# Patient Record
Sex: Male | Born: 1980 | Race: Black or African American | Hispanic: No | Marital: Single
Health system: Southern US, Academic
[De-identification: ages and names within clinical notes are randomized; demographics above are authoritative.]

## PROBLEM LIST (undated history)

## (undated) ENCOUNTER — Encounter

## (undated) ENCOUNTER — Ambulatory Visit

## (undated) ENCOUNTER — Ambulatory Visit: Payer: PRIVATE HEALTH INSURANCE

## (undated) ENCOUNTER — Encounter: Attending: Infectious Disease | Primary: Infectious Disease

## (undated) ENCOUNTER — Telehealth: Attending: Internal Medicine | Primary: Internal Medicine

## (undated) ENCOUNTER — Telehealth

## (undated) ENCOUNTER — Encounter
Attending: Student in an Organized Health Care Education/Training Program | Primary: Student in an Organized Health Care Education/Training Program

## (undated) ENCOUNTER — Telehealth
Attending: Student in an Organized Health Care Education/Training Program | Primary: Student in an Organized Health Care Education/Training Program

## (undated) ENCOUNTER — Encounter: Payer: PRIVATE HEALTH INSURANCE | Attending: Infectious Disease | Primary: Infectious Disease

## (undated) ENCOUNTER — Ambulatory Visit: Attending: Infectious Disease | Primary: Infectious Disease

## (undated) ENCOUNTER — Ambulatory Visit
Payer: PRIVATE HEALTH INSURANCE | Attending: Student in an Organized Health Care Education/Training Program | Primary: Student in an Organized Health Care Education/Training Program

## (undated) ENCOUNTER — Ambulatory Visit
Payer: BLUE CROSS/BLUE SHIELD | Attending: Student in an Organized Health Care Education/Training Program | Primary: Student in an Organized Health Care Education/Training Program

## (undated) ENCOUNTER — Ambulatory Visit: Payer: PRIVATE HEALTH INSURANCE | Attending: Infectious Disease | Primary: Infectious Disease

## (undated) ENCOUNTER — Telehealth: Attending: Infectious Disease | Primary: Infectious Disease

## (undated) ENCOUNTER — Encounter: Attending: Registered" | Primary: Registered"

## (undated) DIAGNOSIS — I10 Essential (primary) hypertension: Secondary | ICD-10-CM

## (undated) DIAGNOSIS — E7800 Pure hypercholesterolemia, unspecified: Secondary | ICD-10-CM

## (undated) DIAGNOSIS — I509 Heart failure, unspecified: Secondary | ICD-10-CM

## (undated) DIAGNOSIS — J45909 Unspecified asthma, uncomplicated: Secondary | ICD-10-CM

## (undated) DIAGNOSIS — Z21 Asymptomatic human immunodeficiency virus [HIV] infection status: Secondary | ICD-10-CM

## (undated) DIAGNOSIS — R51 Headache: Secondary | ICD-10-CM

## (undated) DIAGNOSIS — R519 Headache, unspecified: Secondary | ICD-10-CM

## (undated) DIAGNOSIS — B20 Human immunodeficiency virus [HIV] disease: Secondary | ICD-10-CM

## (undated) DIAGNOSIS — K429 Umbilical hernia without obstruction or gangrene: Secondary | ICD-10-CM

## (undated) DIAGNOSIS — G459 Transient cerebral ischemic attack, unspecified: Secondary | ICD-10-CM

---
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
## 2006-03-29 ENCOUNTER — Emergency Department (HOSPITAL_COMMUNITY): Admission: EM | Admit: 2006-03-29 | Discharge: 2006-03-29 | Payer: Self-pay | Admitting: Emergency Medicine

## 2010-11-15 ENCOUNTER — Emergency Department (HOSPITAL_COMMUNITY)
Admission: EM | Admit: 2010-11-15 | Discharge: 2010-11-16 | Disposition: A | Discharge status: Home / Self Care | Payer: BC Managed Care – PPO | Attending: Emergency Medicine | Admitting: Emergency Medicine

## 2010-11-15 DIAGNOSIS — R1013 Epigastric pain: Secondary | ICD-10-CM | POA: Insufficient documentation

## 2010-11-15 DIAGNOSIS — J45909 Unspecified asthma, uncomplicated: Secondary | ICD-10-CM | POA: Insufficient documentation

## 2010-11-15 DIAGNOSIS — Z79899 Other long term (current) drug therapy: Secondary | ICD-10-CM | POA: Insufficient documentation

## 2010-11-15 DIAGNOSIS — R112 Nausea with vomiting, unspecified: Secondary | ICD-10-CM | POA: Insufficient documentation

## 2010-11-15 DIAGNOSIS — I10 Essential (primary) hypertension: Secondary | ICD-10-CM | POA: Insufficient documentation

## 2010-11-15 LAB — URINALYSIS, ROUTINE W REFLEX MICROSCOPIC
Leukocytes, UA: NEGATIVE
Protein, ur: 30 mg/dL — AB
Specific Gravity, Urine: 1.026 (ref 1.005–1.030)
Urine Glucose, Fasting: NEGATIVE mg/dL

## 2010-11-15 LAB — DIFFERENTIAL
Basophils Absolute: 0 10*3/uL (ref 0.0–0.1)
Basophils Relative: 0 % (ref 0–1)
Eosinophils Absolute: 0 10*3/uL (ref 0.0–0.7)
Monocytes Relative: 8 % (ref 3–12)
Neutro Abs: 11.5 10*3/uL — ABNORMAL HIGH (ref 1.7–7.7)
Neutrophils Relative %: 81 % — ABNORMAL HIGH (ref 43–77)

## 2010-11-15 LAB — CBC
Hemoglobin: 17.4 g/dL — ABNORMAL HIGH (ref 13.0–17.0)
Platelets: 177 10*3/uL (ref 150–400)
RBC: 4.9 MIL/uL (ref 4.22–5.81)
WBC: 14.3 10*3/uL — ABNORMAL HIGH (ref 4.0–10.5)

## 2010-11-15 LAB — URINE MICROSCOPIC-ADD ON

## 2010-11-15 LAB — BASIC METABOLIC PANEL
CO2: 28 mEq/L (ref 19–32)
Chloride: 106 mEq/L (ref 96–112)
GFR calc Af Amer: >60 mL/min (ref >60)
Potassium: 3.7 mEq/L (ref 3.5–5.1)
Sodium: 143 mEq/L (ref 135–145)

## 2010-12-13 ENCOUNTER — Emergency Department (HOSPITAL_COMMUNITY)
Admission: EM | Admit: 2010-12-13 | Discharge: 2010-12-14 | Disposition: A | Discharge status: Home / Self Care | Payer: BC Managed Care – PPO | Attending: Emergency Medicine | Admitting: Emergency Medicine

## 2010-12-13 DIAGNOSIS — I10 Essential (primary) hypertension: Secondary | ICD-10-CM | POA: Insufficient documentation

## 2010-12-13 DIAGNOSIS — Z79899 Other long term (current) drug therapy: Secondary | ICD-10-CM | POA: Insufficient documentation

## 2010-12-13 DIAGNOSIS — J45909 Unspecified asthma, uncomplicated: Secondary | ICD-10-CM | POA: Insufficient documentation

## 2010-12-13 DIAGNOSIS — R220 Localized swelling, mass and lump, head: Secondary | ICD-10-CM | POA: Insufficient documentation

## 2010-12-13 DIAGNOSIS — K029 Dental caries, unspecified: Secondary | ICD-10-CM | POA: Insufficient documentation

## 2010-12-13 DIAGNOSIS — K089 Disorder of teeth and supporting structures, unspecified: Secondary | ICD-10-CM | POA: Insufficient documentation

## 2011-08-22 ENCOUNTER — Emergency Department (HOSPITAL_COMMUNITY): Payer: BC Managed Care – PPO

## 2011-08-22 ENCOUNTER — Encounter: Payer: Self-pay | Admitting: *Deleted

## 2011-08-22 ENCOUNTER — Emergency Department (HOSPITAL_COMMUNITY)
Admission: EM | Admit: 2011-08-22 | Discharge: 2011-08-22 | Disposition: A | Discharge status: Home / Self Care | Payer: BC Managed Care – PPO | Attending: Emergency Medicine | Admitting: Emergency Medicine

## 2011-08-22 DIAGNOSIS — I10 Essential (primary) hypertension: Secondary | ICD-10-CM | POA: Insufficient documentation

## 2011-08-22 DIAGNOSIS — M79672 Pain in left foot: Secondary | ICD-10-CM

## 2011-08-22 DIAGNOSIS — M7989 Other specified soft tissue disorders: Secondary | ICD-10-CM | POA: Insufficient documentation

## 2011-08-22 DIAGNOSIS — E7800 Pure hypercholesterolemia, unspecified: Secondary | ICD-10-CM | POA: Insufficient documentation

## 2011-08-22 DIAGNOSIS — M79609 Pain in unspecified limb: Secondary | ICD-10-CM | POA: Insufficient documentation

## 2011-08-22 HISTORY — DX: Essential (primary) hypertension: I10

## 2011-08-22 HISTORY — DX: Pure hypercholesterolemia, unspecified: E78.00

## 2011-08-22 MED ORDER — HYDROCODONE-ACETAMINOPHEN 5-325 MG PO TABS
1.0000 | ORAL_TABLET | Freq: Once | ORAL | Status: AC
  Administered 2011-08-22: 1 via ORAL
  Filled 2011-08-22: qty 1

## 2011-08-22 MED ORDER — HYDROCODONE-ACETAMINOPHEN 5-325 MG PO TABS: 1.0000 | ORAL_TABLET | ORAL | Status: AC | PRN

## 2011-08-22 MED ORDER — IBUPROFEN 800 MG PO TABS: 800.0000 mg | ORAL_TABLET | Freq: Three times a day (TID) | ORAL | Status: AC

## 2011-08-22 NOTE — ED Notes (Signed)
States that he woke up this am and his foot was swollen and very pain. Good pulses. Denies injury

## 2011-08-22 NOTE — ED Provider Notes (Signed)
History     CSN: [id number] Arrival date & time: 08/22/2011  3:15 PM   First MD Initiated Contact with Patient 08/22/11 1621      Chief Complaint  Patient presents with  . Foot Pain    pt c/o left foot pain with swelling and unable to bend toes on left foot. pt denies recent injury. pt c/o increased pain with weight bearing.     (Consider location/radiation/quality/duration/timing/severity/associated sxs/prior treatment) HPI History provided by pt.   Pt c/o non-traumatic, severe L dorsal foot pain since yesterday.  Associated w/ tingling and edema.  Denies fever.   Has never had these sx in the past.  Immunocompetent.  Past Medical History  Diagnosis Date  . Hypertension   . High cholesterol     History reviewed. No pertinent past surgical history.  History reviewed. No pertinent family history.  History  Substance Use Topics  . Smoking status: Never Smoker   . Smokeless tobacco: Not on file  . Alcohol Use: Yes      Review of Systems  All other systems reviewed and are negative.    Allergies  Review of patient's allergies indicates no known allergies.  Home Medications   Current Outpatient Rx  Name Route Sig Dispense Refill  . ATORVASTATIN CALCIUM 10 MG PO TABS Oral Take 10 mg by mouth daily.      . ENALAPRIL-HYDROCHLOROTHIAZIDE 10-25 MG PO TABS Oral Take 1 tablet by mouth daily.      Marland Kitchen NAPROXEN SODIUM 220 MG PO TABS Oral Take 220 mg by mouth 2 (two) times daily with a meal. pain       BP 125/89  Pulse 90  Temp(Src) 97.8 F (36.6 C) (Oral)  Resp 20  Wt 170 lb (77.111 kg)  SpO2 98%  Physical Exam  Nursing note and vitals reviewed. Constitutional: He is oriented to person, place, and time. He appears well-developed and well-nourished. No distress.  HENT:  Head: Normocephalic and atraumatic.  Eyes:       Normal appearance  Neck: Normal range of motion.  Musculoskeletal:       Left dorsal forefoot edematous.  No erythema or other skin changes.   Tenderness over 2nd and 3rd metatarsals.  Pain w/ passive ROM 2nd and 3rd toes.  2+ radial pulse and distal sensation intact.  Nml ankle exam.    Neurological: He is alert and oriented to person, place, and time.  Psychiatric: He has a normal mood and affect. His behavior is normal.    ED Course  Procedures (including critical care time)  Labs Reviewed - No data to display Dg Foot Complete Left  08/22/2011  *RADIOLOGY REPORT*  Clinical Data: Pain and swelling  LEFT FOOT - COMPLETE 3+ VIEW  Comparison: None.  Findings: No evidence of fracture, dislocation, degenerative change or other focal lesion.  IMPRESSION: Normal radiographs  Original Report Authenticated By: Thomasenia Sales, M.D.     1. Foot pain, left       MDM  Pt presents w/ non-traumatic L foot pain.  Xray neg.  Infection unlikely because pt immunocompetent and no fever or erythema/warmth of foot on exam.  Recommended NSAID, rest, ice, elevation.  Strict return precautions discussed.        Arie Sabina [place], [place] 08/23/11 1615

## 2011-08-27 NOTE — ED Provider Notes (Signed)
Medical screening examination/treatment/procedure(s) were performed by non-physician practitioner and as supervising physician I was immediately available for consultation/collaboration.   Suzi Roots, MD 08/27/11 [phone]

## 2013-05-19 ENCOUNTER — Encounter (HOSPITAL_COMMUNITY): Payer: Self-pay | Admitting: Emergency Medicine

## 2013-05-19 ENCOUNTER — Emergency Department (HOSPITAL_COMMUNITY)
Admission: EM | Admit: 2013-05-19 | Discharge: 2013-05-19 | Disposition: A | Discharge status: Home / Self Care | Payer: BC Managed Care – PPO | Attending: Emergency Medicine | Admitting: Emergency Medicine

## 2013-05-19 DIAGNOSIS — Z7982 Long term (current) use of aspirin: Secondary | ICD-10-CM | POA: Insufficient documentation

## 2013-05-19 DIAGNOSIS — Z8639 Personal history of other endocrine, nutritional and metabolic disease: Secondary | ICD-10-CM | POA: Insufficient documentation

## 2013-05-19 DIAGNOSIS — Z21 Asymptomatic human immunodeficiency virus [HIV] infection status: Secondary | ICD-10-CM | POA: Insufficient documentation

## 2013-05-19 DIAGNOSIS — K089 Disorder of teeth and supporting structures, unspecified: Secondary | ICD-10-CM | POA: Insufficient documentation

## 2013-05-19 DIAGNOSIS — K047 Periapical abscess without sinus: Secondary | ICD-10-CM | POA: Insufficient documentation

## 2013-05-19 DIAGNOSIS — Z862 Personal history of diseases of the blood and blood-forming organs and certain disorders involving the immune mechanism: Secondary | ICD-10-CM | POA: Insufficient documentation

## 2013-05-19 DIAGNOSIS — I10 Essential (primary) hypertension: Secondary | ICD-10-CM | POA: Insufficient documentation

## 2013-05-19 DIAGNOSIS — K0889 Other specified disorders of teeth and supporting structures: Secondary | ICD-10-CM

## 2013-05-19 DIAGNOSIS — Z79899 Other long term (current) drug therapy: Secondary | ICD-10-CM | POA: Insufficient documentation

## 2013-05-19 HISTORY — DX: Asymptomatic human immunodeficiency virus (hiv) infection status: Z21

## 2013-05-19 HISTORY — DX: Human immunodeficiency virus (HIV) disease: B20

## 2013-05-19 MED ORDER — HYDROCODONE-ACETAMINOPHEN 5-325 MG PO TABS: 1.0000 | ORAL_TABLET | ORAL | Status: DC | PRN

## 2013-05-19 MED ORDER — PENICILLIN V POTASSIUM 500 MG PO TABS: 500.0000 mg | ORAL_TABLET | Freq: Four times a day (QID) | ORAL | Status: AC

## 2013-05-19 MED ORDER — NAPROXEN 500 MG PO TABS: 500.0000 mg | ORAL_TABLET | Freq: Two times a day (BID) | ORAL | Status: DC

## 2013-05-19 NOTE — ED Provider Notes (Signed)
Medical screening examination/treatment/procedure(s) were performed by non-physician practitioner and as supervising physician I was immediately available for consultation/collaboration.  Olivia Mackie, MD 05/19/13 [phone]

## 2013-05-19 NOTE — ED Provider Notes (Signed)
CSN: [id number]     Arrival date & time 05/19/13  0206 History   First MD Initiated Contact with Patient 05/19/13 0230     Chief Complaint  Patient presents with  . Dental Pain   HPI   history provided by the patient. Patient is a 32 year old male with history of HIV, hypertension and hypercholesterolemia presenting with bilateral upper dental pains.  Patient reports having left-sided molar pain beginning 4 days ago. The following day he began having right-sided upper molar pain. Pain on both teeth are very sharp and constant. It occasionally aches and throbs. He has been taking over-the-counter Tylenol and ibuprofen without any significant relief. He had also used over-the-counter dental putty to put in the cavities of the teeth to see if this would help but it has not. Denies any swelling of the mouth or face. No associated fever, chills or sweats. Patient states his last CD4 count was "good" in viral load undetectable. He is followed at [facility] and has been taking his medications regularly. Patient has no dentist. No other aggravating or alleviating factors. No other associated symptoms.    Past Medical History  Diagnosis Date  . Hypertension   . High cholesterol   . HIV infection    History reviewed. No pertinent past surgical history. Family History  Problem Relation Age of Onset  . Hypertension Other   . Hyperlipidemia Other   . Diabetes Other    History  Substance Use Topics  . Smoking status: Never Smoker   . Smokeless tobacco: Not on file  . Alcohol Use: No    Review of Systems  Constitutional: Negative for fever and chills.  HENT: Positive for dental problem. Negative for sore throat, drooling, trouble swallowing and voice change.   All other systems reviewed and are negative.    Allergies  Review of patient's allergies indicates no known allergies.  Home Medications   Current Outpatient Rx  Name  Route  Sig  Dispense  Refill  . acetaminophen (TYLENOL) 500  MG tablet   Oral   Take 500 mg by mouth every 6 (six) hours as needed for pain.         Marland Kitchen aspirin EC 325 MG tablet   Oral   Take 325 mg by mouth every 6 (six) hours as needed for pain.         Marland Kitchen efavirenz-emtricitabine-tenofovir (ATRIPLA) 600-200-300 MG per tablet   Oral   Take 1 tablet by mouth every morning.         . enalapril-hydrochlorothiazide (VASERETIC) 10-25 MG per tablet   Oral   Take 1 tablet by mouth every morning.          Marland Kitchen ibuprofen (ADVIL,MOTRIN) 200 MG tablet   Oral   Take 400 mg by mouth every 6 (six) hours as needed for pain.         Marland Kitchen HYDROcodone-acetaminophen (NORCO) 5-325 MG per tablet   Oral   Take 1 tablet by mouth every 4 (four) hours as needed for pain.   6 tablet   0   . naproxen (NAPROSYN) 500 MG tablet   Oral   Take 1 tablet (500 mg total) by mouth 2 (two) times daily.   30 tablet   0   . penicillin v potassium (VEETID) 500 MG tablet   Oral   Take 1 tablet (500 mg total) by mouth 4 (four) times daily.   40 tablet   0    BP 165/111  Pulse 83  Temp(Src) 98.7 F (37.1 C) (Oral)  Resp 20  Wt 189 lb (85.73 kg)  SpO2 94% Physical Exam  Nursing note and vitals reviewed. Constitutional: He is oriented to person, place, and time. He appears well-developed and well-nourished. No distress.  HENT:  Head: Normocephalic.  Mouth/Throat:    There is pain to percussion over the left upper first and second premolar teeth. There is temporary until putty in place. No significant swelling of the adjacent bones without obvious dental abscess.  There is pain to percussion over the right upper first molar tooth. Dental putty also within the molar tooth cavity. No swelling of the adjacent gums.  Eyes: Conjunctivae are normal.  Neck: Normal range of motion. Neck supple.  Cardiovascular: Normal rate and regular rhythm.   Pulmonary/Chest: Effort normal and breath sounds normal.  Musculoskeletal: Normal range of motion.  Lymphadenopathy:    He  has no cervical adenopathy.  Neurological: He is alert and oriented to person, place, and time.  Skin: Skin is warm.  Psychiatric: He has a normal mood and affect. His behavior is normal.    ED Course  Procedures   Dental Block Performed by: Angus Seller Authorized by: Angus Seller Consent: Verbal consent obtained. Risks and benefits: risks, benefits and alternatives were discussed Consent given by: patient Patient identity confirmed: provided demographic data  Location: Left upper first and second premolar  Local anesthetic: Bupivacaine 0.5% with epinephrine  Anesthetic total: 1.8 ml  Irrigation method: syringe  Patient tolerance: Patient tolerated the procedure well with no immediate complications. Pain improved.  Dental Block Performed by: Angus Seller Authorized by: Angus Seller Consent: Verbal consent obtained. Risks and benefits: risks, benefits and alternatives were discussed Consent given by: patient Patient identity confirmed: provided demographic data  Location: Right upper first molar  Local anesthetic: Bupivacaine 0.5% with epinephrine  Anesthetic total: 1.8 ml  Irrigation method: syringe  Patient tolerance: Patient tolerated the procedure well with no immediate complications. Pain improved.    MDM   1. Pain, dental   2. Periapical abscess    Patient seen and evaluated. The patient appears well in mild discomfort but in no acute distress. Does not appear severely ill or toxic    Angus Seller, PA-C 05/19/13 [phone]

## 2013-05-19 NOTE — ED Notes (Signed)
Pt is c/o dental pain on the top on both sides  Pt states pain started about 4 days ago  Pt states he went and got some "putty" and put it in the holes to see if that would help but it did not

## 2013-05-19 NOTE — ED Notes (Signed)
Made Theron Arista , P.A. Aware of BP - no additional orders given - Will remind patient to take medication for BP as prescribed.

## 2013-09-08 ENCOUNTER — Encounter (HOSPITAL_COMMUNITY): Payer: Self-pay | Admitting: Emergency Medicine

## 2013-09-08 ENCOUNTER — Emergency Department (HOSPITAL_COMMUNITY)
Admission: EM | Admit: 2013-09-08 | Discharge: 2013-09-08 | Disposition: A | Discharge status: Home / Self Care | Payer: BC Managed Care – PPO | Attending: Emergency Medicine | Admitting: Emergency Medicine

## 2013-09-08 ENCOUNTER — Emergency Department (HOSPITAL_COMMUNITY): Payer: BC Managed Care – PPO

## 2013-09-08 DIAGNOSIS — R1110 Vomiting, unspecified: Secondary | ICD-10-CM | POA: Insufficient documentation

## 2013-09-08 DIAGNOSIS — Z21 Asymptomatic human immunodeficiency virus [HIV] infection status: Secondary | ICD-10-CM | POA: Insufficient documentation

## 2013-09-08 DIAGNOSIS — I10 Essential (primary) hypertension: Secondary | ICD-10-CM | POA: Insufficient documentation

## 2013-09-08 DIAGNOSIS — E7800 Pure hypercholesterolemia, unspecified: Secondary | ICD-10-CM | POA: Insufficient documentation

## 2013-09-08 DIAGNOSIS — J029 Acute pharyngitis, unspecified: Secondary | ICD-10-CM | POA: Insufficient documentation

## 2013-09-08 DIAGNOSIS — R079 Chest pain, unspecified: Secondary | ICD-10-CM | POA: Insufficient documentation

## 2013-09-08 DIAGNOSIS — Z79899 Other long term (current) drug therapy: Secondary | ICD-10-CM | POA: Insufficient documentation

## 2013-09-08 DIAGNOSIS — R1310 Dysphagia, unspecified: Secondary | ICD-10-CM | POA: Insufficient documentation

## 2013-09-08 MED ORDER — OMEPRAZOLE 20 MG PO CPDR: 20.0000 mg | DELAYED_RELEASE_CAPSULE | Freq: Every day | ORAL | Status: DC

## 2013-09-08 MED ORDER — GLUCAGON HCL (RDNA) 1 MG IJ SOLR
1.0000 mg | Freq: Once | INTRAMUSCULAR | Status: AC
  Administered 2013-09-08: 1 mg via INTRAMUSCULAR
  Filled 2013-09-08: qty 1

## 2013-09-08 NOTE — ED Notes (Signed)
Pt report onset of SOB, difficulty breathing morning after eating "japanesse food". Loss of voice, feeling of an obstruction at the neck. On note is pt elevated B/P of which pt reports being on B/P meds (lisinopril) which he is yet to take this a.m.

## 2013-09-08 NOTE — ED Provider Notes (Signed)
CSN: [id number]     Arrival date & time 09/08/13  1006 History   First MD Initiated Contact with Patient 09/08/13 1028     Chief Complaint  Patient presents with  . Shortness of Breath  . Dysphagia   (Consider location/radiation/quality/duration/timing/severity/associated sxs/prior Treatment) Patient is a 32 y.o. male presenting with shortness of breath and foreign body swallowed. The history is provided by the patient. No language interpreter was used.  Shortness of Breath Severity:  Mild Associated symptoms: chest pain and sore throat   Associated symptoms: no abdominal pain, no fever and no rash   Swallowed Foreign Body This is a new problem. The current episode started yesterday. Associated symptoms include chest pain and a sore throat. Pertinent negatives include no abdominal pain, fever, nausea or rash. Associated symptoms comments: He was eating leftover shrimp and rice this morning when a bite felt like it caught in his lower esophagus. He vomited x 1 producing the rice but not the shrimp which he feels is still stuck. He denies any previous allergy to shellfish and no symptoms last night when he ate the original meal. No rash, tongue or lip swelling. He denies any history of esophageal stricture requiring dilation. .    Past Medical History  Diagnosis Date  . Hypertension   . High cholesterol   . HIV infection    History reviewed. No pertinent past surgical history. Family History  Problem Relation Age of Onset  . Hypertension Other   . Hyperlipidemia Other   . Diabetes Other    History  Substance Use Topics  . Smoking status: Never Smoker   . Smokeless tobacco: Not on file  . Alcohol Use: Yes     Comment: Occasional    Review of Systems  Constitutional: Negative for fever.  HENT: Positive for sore throat and trouble swallowing.   Respiratory: Positive for shortness of breath.   Cardiovascular: Positive for chest pain.  Gastrointestinal: Negative for nausea and  abdominal pain.       See HPI re: vomiting.  Skin: Negative for rash.    Allergies  Review of patient's allergies indicates no known allergies.  Home Medications   Current Outpatient Rx  Name  Route  Sig  Dispense  Refill  . aspirin EC 325 MG tablet   Oral   Take 325 mg by mouth every 6 (six) hours as needed for pain.         Marland Kitchen efavirenz-emtricitabine-tenofovir (ATRIPLA) 600-200-300 MG per tablet   Oral   Take 1 tablet by mouth every morning.         . enalapril-hydrochlorothiazide (VASERETIC) 10-25 MG per tablet   Oral   Take 1 tablet by mouth every morning.          Marland Kitchen HYDROcodone-acetaminophen (NORCO) 5-325 MG per tablet   Oral   Take 1 tablet by mouth every 4 (four) hours as needed for pain.   6 tablet   0   . ibuprofen (ADVIL,MOTRIN) 200 MG tablet   Oral   Take 400 mg by mouth every 6 (six) hours as needed for pain.         . naproxen (NAPROSYN) 500 MG tablet   Oral   Take 1 tablet (500 mg total) by mouth 2 (two) times daily.   30 tablet   0    BP 168/133  Pulse 92  Temp(Src) 98.3 F (36.8 C) (Oral)  Resp 16  SpO2 97% Physical Exam  Constitutional: He is oriented to person,  place, and time. He appears well-developed and well-nourished. No distress.  HENT:  Mouth/Throat: Oropharynx is clear and moist.  No swelling or tongue or lips.  Neck: Normal range of motion.  Cardiovascular: Normal rate.  Exam reveals no friction rub.   Pulmonary/Chest: Effort normal. No stridor. He has no wheezes. He has no rales.  Musculoskeletal: He exhibits no edema.  Neurological: He is alert and oriented to person, place, and time.  Skin: No rash noted.    ED Course  Procedures (including critical care time) Labs Review Labs Reviewed - No data to display Imaging Review No results found.  EKG Interpretation   None     Dg Chest 2 View  09/08/2013   CLINICAL DATA:  Shortness of breath  EXAM: CHEST  2 VIEW  COMPARISON:  None.  FINDINGS: Lungs are clear. No  pleural effusion or pneumothorax.  The heart is normal in size.  Visualized osseous structures are within normal limits.  IMPRESSION: No evidence of acute cardiopulmonary disease.   Electronically Signed   By: Charline Bills M.D.   On: 09/08/2013 11:02    MDM  No diagnosis found. 1. Dysphagia  He feels improved after Glucagon and is tolerating PO fluids. VS - hypertensive but no medications this morning. Encouraged to take meds when he returns home and follow up with GI this week.    Arnoldo Hooker, PA-C 09/08/13 1219

## 2013-09-10 NOTE — ED Provider Notes (Signed)
Medical screening examination/treatment/procedure(s) were conducted as a shared visit with non-physician practitioner(s) and myself.  I personally evaluated the patient during the encounter.  EKG Interpretation   None       Pt states when eating last pm felt as if food bolus stuck, mid esophagus area. No hx prior. No sob. Chest cta. Trial po fluids, as symptoms now improved.   Suzi Roots, MD 09/10/13 [phone]

## 2013-09-26 ENCOUNTER — Encounter (HOSPITAL_COMMUNITY): Payer: Self-pay | Admitting: Emergency Medicine

## 2013-09-26 ENCOUNTER — Emergency Department (INDEPENDENT_AMBULATORY_CARE_PROVIDER_SITE_OTHER)
Admission: EM | Admit: 2013-09-26 | Discharge: 2013-09-26 | Disposition: A | Discharge status: Home / Self Care | Payer: BC Managed Care – PPO | Source: Home / Self Care

## 2013-09-26 DIAGNOSIS — K429 Umbilical hernia without obstruction or gangrene: Secondary | ICD-10-CM

## 2013-09-26 HISTORY — DX: Unspecified asthma, uncomplicated: J45.909

## 2013-09-26 NOTE — Discharge Instructions (Signed)
You have a small (approximately 1cm) umbilical hernia.  There is no evidence of bowel incarceration or need for further workup at this time.  Please read through the information below and go to the emergency room if you develop severe abdominal pain and the hernia protrudes and will not go down.    Hernia A hernia occurs when an internal organ pushes out through a weak spot in the abdominal wall. Hernias most commonly occur in the groin and around the navel. Hernias often can be pushed back into place (reduced). Most hernias tend to get worse over time. Some abdominal hernias can get stuck in the opening (irreducible or incarcerated hernia) and cannot be reduced. An irreducible abdominal hernia which is tightly squeezed into the opening is at risk for impaired blood supply (strangulated hernia). A strangulated hernia is a medical emergency. Because of the risk for an irreducible or strangulated hernia, surgery may be recommended to repair a hernia. CAUSES   Heavy lifting.  Prolonged coughing.  Straining to have a bowel movement.  A cut (incision) made during an abdominal surgery. HOME CARE INSTRUCTIONS   Bed rest is not required. You may continue your normal activities.  Avoid lifting more than 10 pounds (4.5 kg) or straining.  Cough gently. If you are a smoker it is best to stop. Even the best hernia repair can break down with the continual strain of coughing. Even if you do not have your hernia repaired, a cough will continue to aggravate the problem.  Do not wear anything tight over your hernia. Do not try to keep it in with an outside bandage or truss. These can damage abdominal contents if they are trapped within the hernia sac.  Eat a normal diet.  Avoid constipation. Straining over long periods of time will increase hernia size and encourage breakdown of repairs. If you cannot do this with diet alone, stool softeners may be used. SEEK IMMEDIATE MEDICAL CARE IF:   You have a  fever.  You develop increasing abdominal pain.  You feel nauseous or vomit.  Your hernia is stuck outside the abdomen, looks discolored, feels hard, or is tender.  You have any changes in your bowel habits or in the hernia that are unusual for you.  You have increased pain or swelling around the hernia.  You cannot push the hernia back in place by applying gentle pressure while lying down. MAKE SURE YOU:   Understand these instructions.  Will watch your condition.  Will get help right away if you are not doing well or get worse. Document Released: 09/05/2005 Document Revised: 11/28/2011 Document Reviewed: 04/24/2008 [facility] Patient Information 2014 [place], [place].  Hernia Repair Care After These instructions give you information on caring for yourself after your procedure. Your doctor may also give you more specific instructions. Call your doctor if you have any problems or questions after your procedure. HOME CARE   You may have changes in your poops (bowel movements).  You may have loose or watery poop (diarrhea).  You may be not able to poop.  Your bowels will slowly get back to normal.  Do not eat any food that makes you sick to your stomach (nauseous). Eat small meals 4 to 6 times a day instead of 3 large ones.  Do not drink pop. It will give you gas.  Do not drink alcohol.  Do not lift anything heavier than 10 pounds. This is about the weight of a gallon of milk.  Do not do anything  that makes you very tired for at least 6 weeks.  Do not get your wound wet for 2 days.  You may take a sponge bath during this time.  After 2 days you may take a shower. Gently pat your surgical cut (incision) dry with a towel. Do not rub it.  For men: You may have been given an athletic supporter (scrotal support) before you left the hospital. It holds your scrotum and testicles closer to your body so there is no strain on your wound. Wear the supporter until your doctor tells  you that you do not need it anymore. GET HELP RIGHT AWAY IF:  You have watery poop, or cannot poop for more than 3 days.  You feel sick to your stomach or throw up (vomit) more than 2 or 3 times.  You have temperature by mouth above 102 F (38.9 C).  You see redness or puffiness (swelling) around your wound.  You see yellowish white fluid (pus) coming from your wound.  You see a bulge or bump in your lower belly (abdomen) or near your groin.  You develop a rash, trouble breathing, or any other symptoms from medicines taken. MAKE SURE YOU:  Understand these instructions.  Will watch your condition.  Will get help right away if your are not doing well or get worse. Document Released: 08/18/2008 Document Revised: 11/28/2011 Document Reviewed: 08/18/2008 [facility] Patient Information 2014 [place]

## 2013-09-26 NOTE — ED Notes (Signed)
Noticed "knot" to left naval 12/25.  Has continued intermittent cramping to left abd; knot is tender to palpation.  Small hernia noted; soft, but not completely reduceabl; very tender to palp.  Denies hx hernia.

## 2013-09-26 NOTE — ED Provider Notes (Signed)
CSN: [id number]     Arrival date & time 09/26/13  1736 History   None    Chief Complaint  Patient presents with  . Hernia   (Consider location/radiation/quality/duration/timing/severity/associated sxs/prior Treatment) HPI  Hernia: started on 12/25. Located at [employer]. Able to push it down. crampy sensation today while standing. Daily BM. Trying to limit heavy lifting. No h/o abd surgery. Non-painful. No marked wt loss or gain. No recent increase in wt lifting or exercise lately. Lifts about 25lbs max at work.    Past Medical History  Diagnosis Date  . Hypertension   . High cholesterol   . HIV infection   . Asthma    History reviewed. No pertinent past surgical history. Family History  Problem Relation Age of Onset  . Hypertension Other   . Hyperlipidemia Other   . Diabetes Other    History  Substance Use Topics  . Smoking status: Never Smoker   . Smokeless tobacco: Not on file  . Alcohol Use: Yes     Comment: Occasional    Review of Systems  Constitutional: Negative for appetite change.  Gastrointestinal: Negative for nausea, vomiting, abdominal pain, diarrhea, constipation, blood in stool, abdominal distention, anal bleeding and rectal pain.  All other systems reviewed and are negative.    Allergies  Review of patient's allergies indicates no known allergies.  Home Medications   Current Outpatient Rx  Name  Route  Sig  Dispense  Refill  . albuterol (PROVENTIL HFA;VENTOLIN HFA) 108 (90 BASE) MCG/ACT inhaler   Inhalation   Inhale 2 puffs into the lungs every 6 (six) hours as needed for wheezing or shortness of breath.         . efavirenz-emtricitabine-tenofovir (ATRIPLA) 600-200-300 MG per tablet   Oral   Take 1 tablet by mouth at bedtime.          . enalapril-hydrochlorothiazide (VASERETIC) 10-25 MG per tablet   Oral   Take 1 tablet by mouth every morning.          Marland Kitchen aspirin EC 325 MG tablet   Oral   Take 325 mg by mouth every 6 (six) hours as needed  for pain.         Marland Kitchen ibuprofen (ADVIL,MOTRIN) 200 MG tablet   Oral   Take 400 mg by mouth every 6 (six) hours as needed for pain.         Marland Kitchen omeprazole (PRILOSEC) 20 MG capsule   Oral   Take 1 capsule (20 mg total) by mouth daily.   15 capsule   0    BP 153/114  Pulse 70  Temp(Src) 98.9 F (37.2 C) (Oral)  Resp 18  SpO2 100% Physical Exam  Constitutional: He is oriented to person, place, and time. He appears well-developed and well-nourished. No distress.  HENT:  Head: Normocephalic and atraumatic.  Eyes: EOM are normal. Pupils are equal, round, and reactive to light.  Neck: Normal range of motion.  Pulmonary/Chest: Effort normal. No respiratory distress.  Abdominal: Soft. Bowel sounds are normal. He exhibits no distension and no mass. There is no tenderness. There is no rebound and no guarding.  Small 1cm umbilical hernia w/o bowel present, even on sitting up or standing  Musculoskeletal: Normal range of motion. He exhibits no edema and no tenderness.  Neurological: He is oriented to person, place, and time.  Skin: Skin is warm and dry.  Psychiatric: He has a normal mood and affect. His behavior is normal. Judgment and thought content normal.  ED Course  Procedures (including critical care time) Labs Review Labs Reviewed - No data to display Imaging Review No results found.  EKG Interpretation    Date/Time:    Ventricular Rate:    PR Interval:    QRS Duration:   QT Interval:    QTC Calculation:   R Axis:     Text Interpretation:              MDM   1. Umbilical hernia    33yo AAM w/ very small umbilical hernia w/o bowel present/incarcerated. Pt educated on hernias (etiology) and on reasons for emergent ED evaluation vs elective care by Gen Surg - handout given - precautions reviewed adn all questions reviewed  Shelly Flattenavid Merrell, MD Family Medicine PGY-3 09/26/2013, 7:52 PM      Ozella Rocksavid J Merrell, MD 09/26/13 [phone]

## 2013-09-30 NOTE — ED Provider Notes (Signed)
Medical screening examination/treatment/procedure(s) were performed by resident physician or non-physician practitioner and as supervising physician I was immediately available for consultation/collaboration.   Barkley Bruns MD.   Linna Hoff, MD 09/30/13 [phone]

## 2014-04-09 ENCOUNTER — Other Ambulatory Visit: Payer: Self-pay | Admitting: Internal Medicine

## 2014-04-09 ENCOUNTER — Ambulatory Visit
Admission: RE | Admit: 2014-04-09 | Discharge: 2014-04-09 | Disposition: A | Discharge status: Home / Self Care | Payer: BC Managed Care – PPO | Source: Ambulatory Visit | Attending: Internal Medicine | Admitting: Internal Medicine

## 2014-04-09 DIAGNOSIS — R29898 Other symptoms and signs involving the musculoskeletal system: Secondary | ICD-10-CM

## 2014-04-10 ENCOUNTER — Observation Stay (HOSPITAL_COMMUNITY): Payer: BC Managed Care – PPO

## 2014-04-10 ENCOUNTER — Observation Stay (HOSPITAL_COMMUNITY)
Admission: EM | Admit: 2014-04-10 | Discharge: 2014-04-11 | Disposition: A | Discharge status: Home / Self Care | Payer: BC Managed Care – PPO | Attending: Internal Medicine | Admitting: Internal Medicine

## 2014-04-10 ENCOUNTER — Encounter (HOSPITAL_COMMUNITY): Payer: Self-pay | Admitting: Emergency Medicine

## 2014-04-10 ENCOUNTER — Emergency Department (HOSPITAL_COMMUNITY): Payer: BC Managed Care – PPO

## 2014-04-10 DIAGNOSIS — R531 Weakness: Secondary | ICD-10-CM

## 2014-04-10 DIAGNOSIS — I10 Essential (primary) hypertension: Secondary | ICD-10-CM

## 2014-04-10 DIAGNOSIS — B20 Human immunodeficiency virus [HIV] disease: Secondary | ICD-10-CM | POA: Insufficient documentation

## 2014-04-10 DIAGNOSIS — R5381 Other malaise: Secondary | ICD-10-CM | POA: Insufficient documentation

## 2014-04-10 DIAGNOSIS — G459 Transient cerebral ischemic attack, unspecified: Principal | ICD-10-CM

## 2014-04-10 DIAGNOSIS — R200 Anesthesia of skin: Secondary | ICD-10-CM

## 2014-04-10 DIAGNOSIS — I160 Hypertensive urgency: Secondary | ICD-10-CM | POA: Diagnosis present

## 2014-04-10 DIAGNOSIS — R5383 Other fatigue: Secondary | ICD-10-CM

## 2014-04-10 DIAGNOSIS — R209 Unspecified disturbances of skin sensation: Secondary | ICD-10-CM

## 2014-04-10 DIAGNOSIS — Z21 Asymptomatic human immunodeficiency virus [HIV] infection status: Secondary | ICD-10-CM

## 2014-04-10 DIAGNOSIS — E785 Hyperlipidemia, unspecified: Secondary | ICD-10-CM

## 2014-04-10 LAB — I-STAT CHEM 8, ED
BUN: 7 mg/dL (ref 6–23)
CALCIUM ION: 1.19 mmol/L (ref 1.12–1.23)
CHLORIDE: 104 meq/L (ref 96–112)
Creatinine, Ser: 0.9 mg/dL (ref 0.50–1.35)
Glucose, Bld: 108 mg/dL — ABNORMAL HIGH (ref 70–99)
HEMATOCRIT: 50 % (ref 39.0–52.0)
Hemoglobin: 17 g/dL (ref 13.0–17.0)
Potassium: 3.8 mEq/L (ref 3.7–5.3)
SODIUM: 140 meq/L (ref 137–147)
TCO2: 25 mmol/L (ref 0–100)

## 2014-04-10 LAB — COMPREHENSIVE METABOLIC PANEL
ALBUMIN: 4 g/dL (ref 3.5–5.2)
ALT: 43 U/L (ref 0–53)
AST: 30 U/L (ref 0–37)
Alkaline Phosphatase: 59 U/L (ref 39–117)
Anion gap: 12 (ref 5–15)
BUN: 8 mg/dL (ref 6–23)
CALCIUM: 9.7 mg/dL (ref 8.4–10.5)
CO2: 25 mEq/L (ref 19–32)
Chloride: 102 mEq/L (ref 96–112)
Creatinine, Ser: 0.81 mg/dL (ref 0.50–1.35)
GFR calc Af Amer: >90 mL/min (ref >90)
GFR calc non Af Amer: >90 mL/min (ref >90)
Glucose, Bld: 108 mg/dL — ABNORMAL HIGH (ref 70–99)
Potassium: 4 mEq/L (ref 3.7–5.3)
SODIUM: 139 meq/L (ref 137–147)
Total Bilirubin: 0.2 mg/dL — ABNORMAL LOW (ref 0.3–1.2)
Total Protein: 8.6 g/dL — ABNORMAL HIGH (ref 6.0–8.3)

## 2014-04-10 LAB — CBC
HCT: 45.5 % (ref 39.0–52.0)
Hemoglobin: 16.1 g/dL (ref 13.0–17.0)
MCH: 34.6 pg — AB (ref 26.0–34.0)
MCHC: 35.4 g/dL (ref 30.0–36.0)
MCV: 97.8 fL (ref 78.0–100.0)
Platelets: 140 10*3/uL — ABNORMAL LOW (ref 150–400)
RBC: 4.65 MIL/uL (ref 4.22–5.81)
RDW: 13 % (ref 11.5–15.5)
WBC: 5.3 10*3/uL (ref 4.0–10.5)

## 2014-04-10 LAB — APTT: aPTT: 28 seconds (ref 24–37)

## 2014-04-10 LAB — RAPID URINE DRUG SCREEN, HOSP PERFORMED
AMPHETAMINES: NOT DETECTED
BENZODIAZEPINES: NOT DETECTED
Barbiturates: NOT DETECTED
COCAINE: NOT DETECTED
Opiates: NOT DETECTED
Tetrahydrocannabinol: NOT DETECTED

## 2014-04-10 LAB — URINALYSIS, ROUTINE W REFLEX MICROSCOPIC
BILIRUBIN URINE: NEGATIVE
Glucose, UA: NEGATIVE mg/dL
KETONES UR: NEGATIVE mg/dL
Leukocytes, UA: NEGATIVE
NITRITE: NEGATIVE
PH: 6 (ref 5.0–8.0)
Protein, ur: NEGATIVE mg/dL
Specific Gravity, Urine: 1.02 (ref 1.005–1.030)
Urobilinogen, UA: 0.2 mg/dL (ref 0.0–1.0)

## 2014-04-10 LAB — ETHANOL

## 2014-04-10 LAB — DIFFERENTIAL
BASOS ABS: 0 10*3/uL (ref 0.0–0.1)
Basophils Relative: 1 % (ref 0–1)
EOS PCT: 1 % (ref 0–5)
Eosinophils Absolute: 0.1 10*3/uL (ref 0.0–0.7)
Lymphocytes Relative: 39 % (ref 12–46)
Lymphs Abs: 2.1 10*3/uL (ref 0.7–4.0)
Monocytes Absolute: 0.6 10*3/uL (ref 0.1–1.0)
Monocytes Relative: 11 % (ref 3–12)
Neutro Abs: 2.6 10*3/uL (ref 1.7–7.7)
Neutrophils Relative %: 48 % (ref 43–77)

## 2014-04-10 LAB — PROTIME-INR
INR: 0.99 (ref 0.00–1.49)
PROTHROMBIN TIME: 13.1 s (ref 11.6–15.2)

## 2014-04-10 LAB — TROPONIN I: Troponin I: <0.3 ng/mL (ref <0.30)

## 2014-04-10 LAB — URINE MICROSCOPIC-ADD ON

## 2014-04-10 IMAGING — CT CT HEAD W/O CM
2 series · 16 of 30 positions shown, 20 images · non-contrast
Comparison: None.

CLINICAL DATA: Hypertension.  Paresthesias in the arm.

EXAM:
CT HEAD WITHOUT CONTRAST
TECHNIQUE: Contiguous axial images were obtained from the base of the skull
through the vertex without intravenous contrast.

[Series 2: head w/o · axial · non-contrast · 0.49mm/px · z∈[-370,-240]mm · 13 of 32 slices shown, 17 images]
[im 3/32  brain]
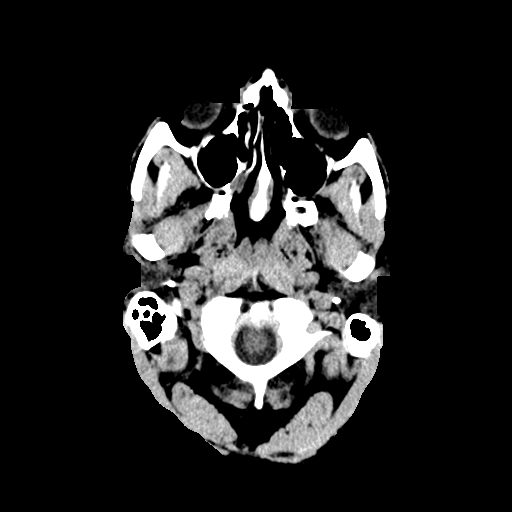
[im 3/32  bone]
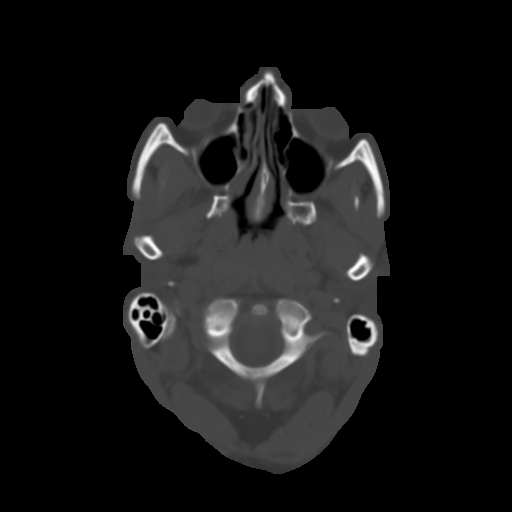
[im 5/32  brain]
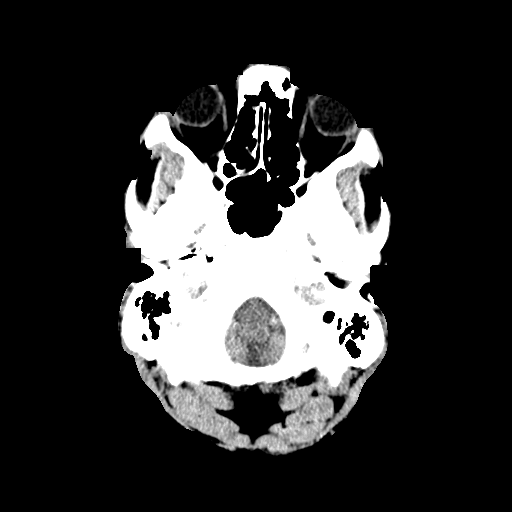
[im 7/32  brain]
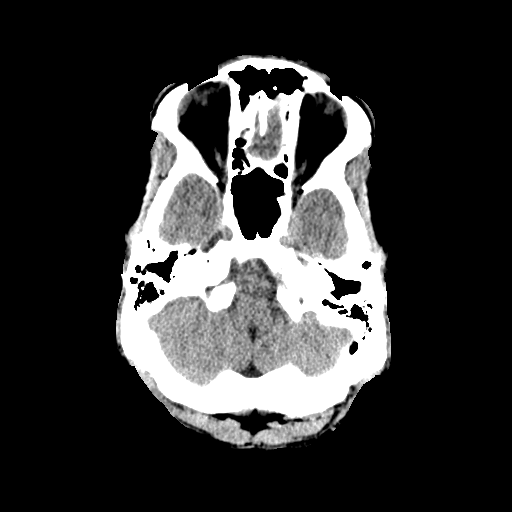
[im 9/32  brain]
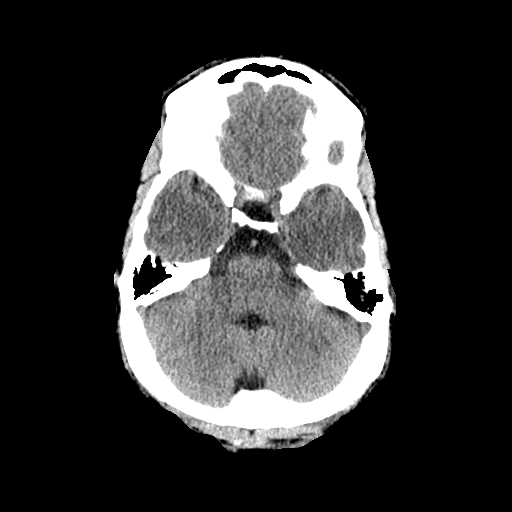
[im 12/32  brain]
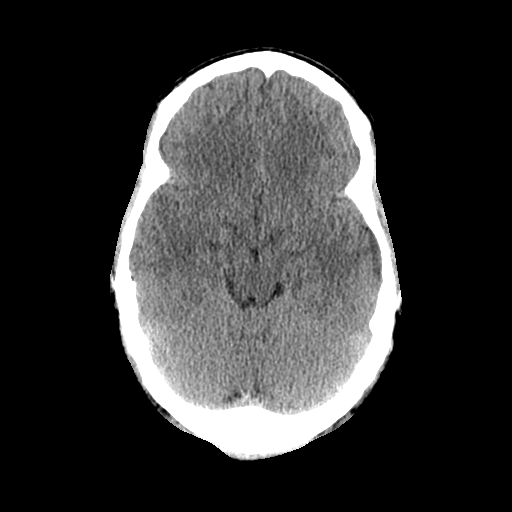
[im 12/32  bone]
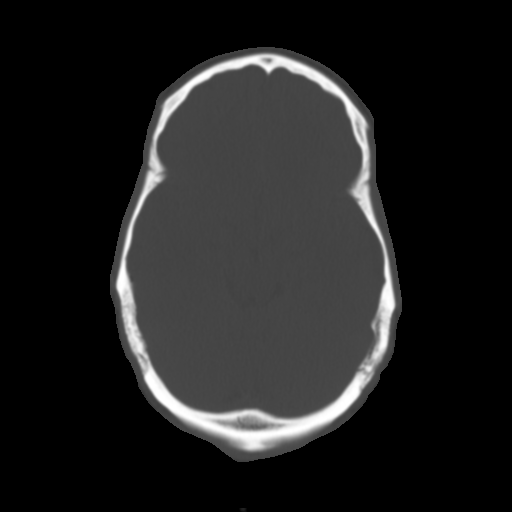
[im 14/32  brain]
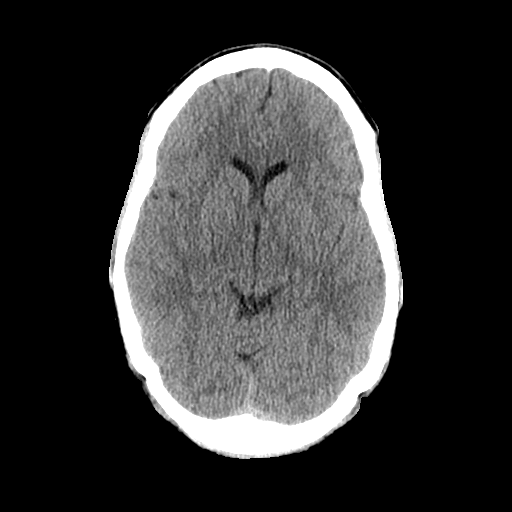
[im 16/32  brain]
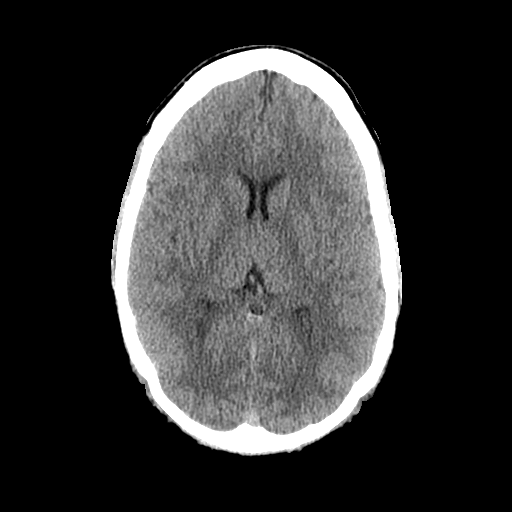
[im 18/32  brain]
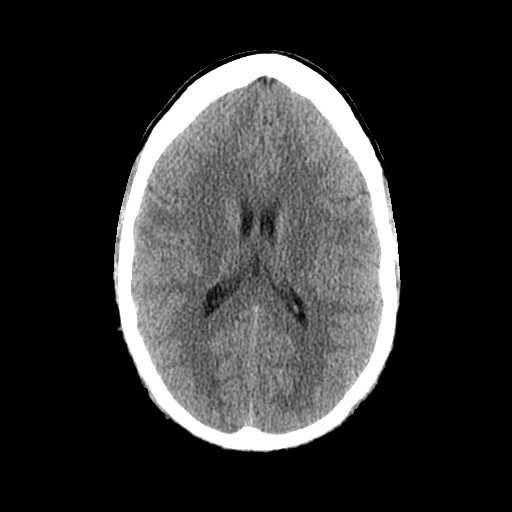
[im 20/32  brain]
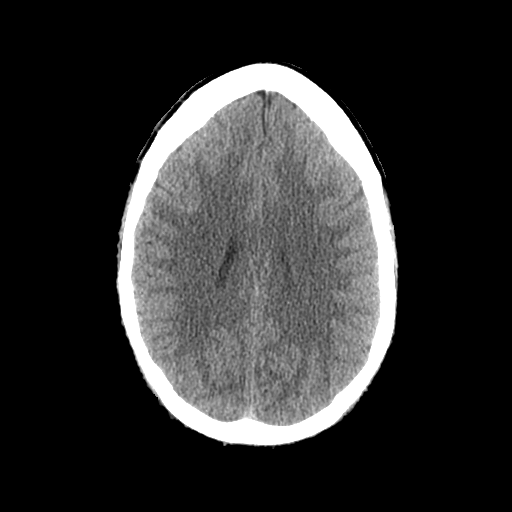
[im 20/32  bone]
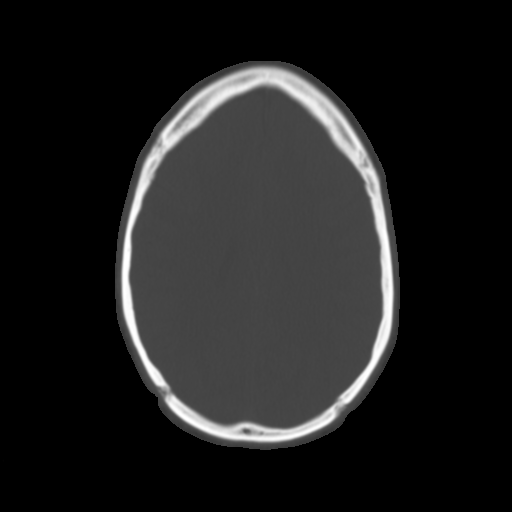
[im 23/32  brain]
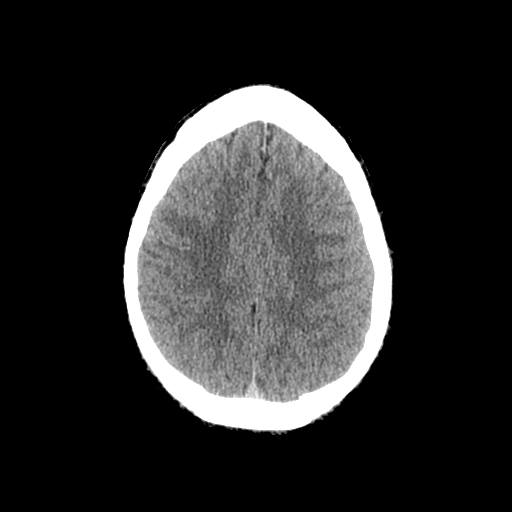
[im 25/32  brain]
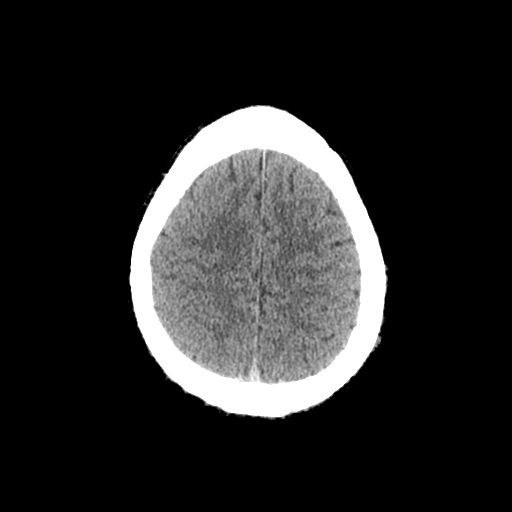
[im 27/32  brain]
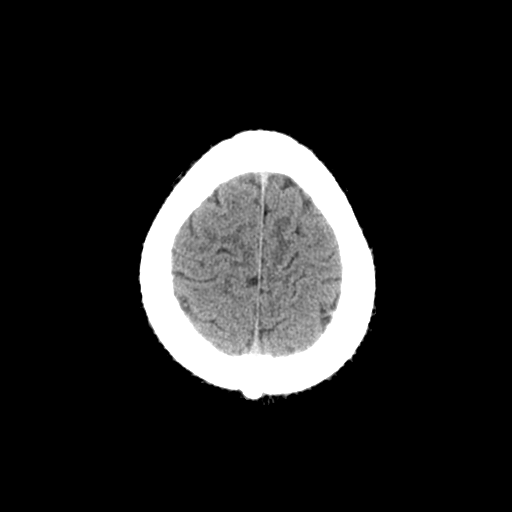
[im 29/32  brain]
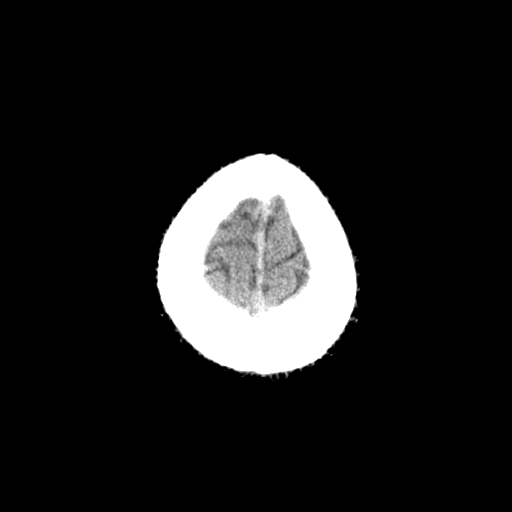
[im 29/32  bone]
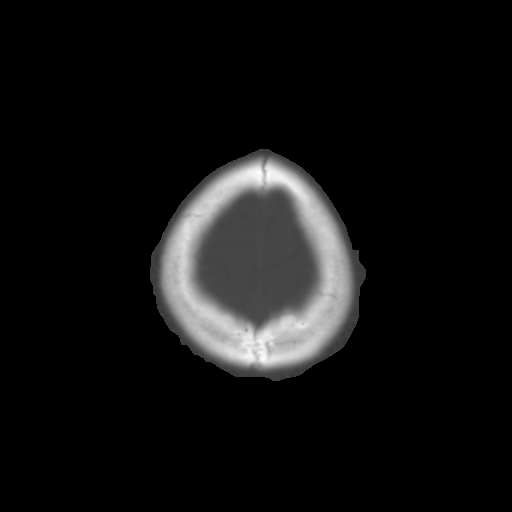

[Series 3: bone windows · axial · 0.49mm/px · z∈[-370,-325]mm · 3 of 32 slices shown]
[im 3/32  bone]
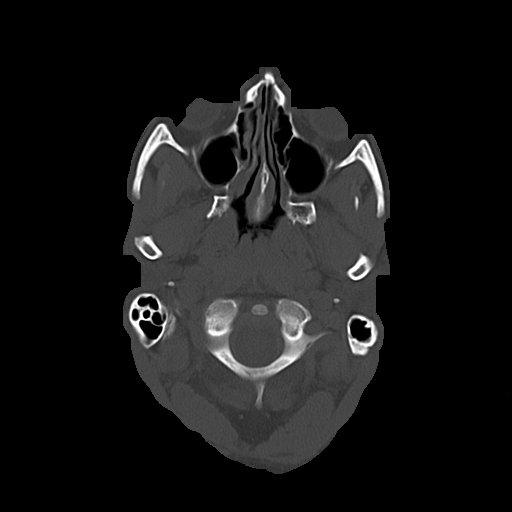
[im 7/32  bone]
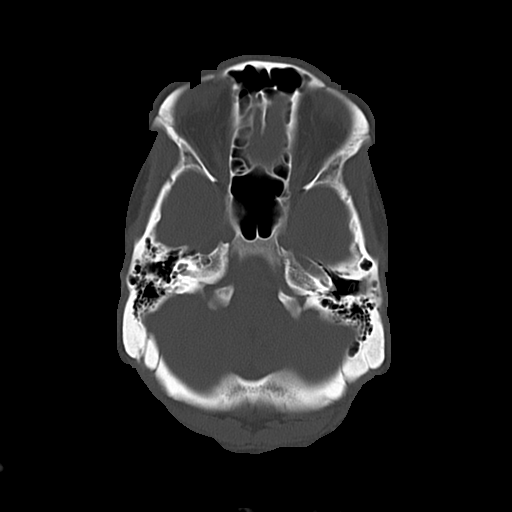
[im 12/32  bone]
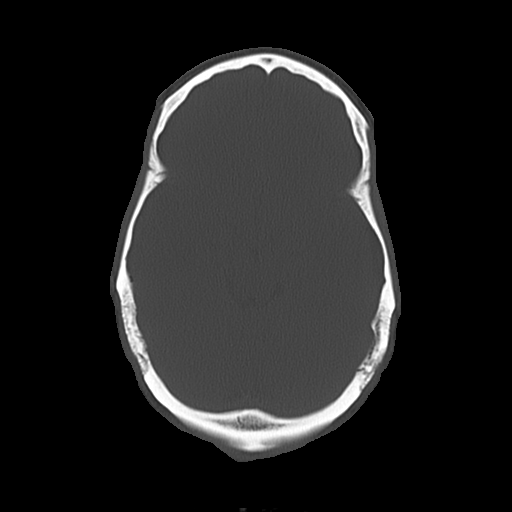

[16 of 30 positions shown; findings below may reference images not displayed]

FINDINGS: No mass lesion, mass effect, midline shift, hydrocephalus,
hemorrhage. No territorial ischemia or acute infarction. Paranasal
sinuses are within normal limits. Mastoid air cells clear.
IMPRESSION: Negative CT head.

## 2014-04-10 MED ORDER — ONDANSETRON HCL 4 MG PO TABS: 4.0000 mg | ORAL_TABLET | Freq: Four times a day (QID) | ORAL | Status: DC | PRN

## 2014-04-10 MED ORDER — ASPIRIN EC 325 MG PO TBEC: 325.0000 mg | DELAYED_RELEASE_TABLET | Freq: Four times a day (QID) | ORAL | Status: DC | PRN

## 2014-04-10 MED ORDER — IBUPROFEN 400 MG PO TABS
400.0000 mg | ORAL_TABLET | Freq: Four times a day (QID) | ORAL | Status: DC | PRN
  Filled 2014-04-10: qty 1

## 2014-04-10 MED ORDER — ONDANSETRON HCL 4 MG/2ML IJ SOLN: 4.0000 mg | Freq: Four times a day (QID) | INTRAMUSCULAR | Status: DC | PRN

## 2014-04-10 MED ORDER — METOPROLOL SUCCINATE ER 100 MG PO TB24
100.0000 mg | ORAL_TABLET | Freq: Every day | ORAL | Status: DC
  Administered 2014-04-11: 100 mg via ORAL
  Filled 2014-04-10: qty 1

## 2014-04-10 MED ORDER — ACETAMINOPHEN 650 MG RE SUPP: 650.0000 mg | Freq: Four times a day (QID) | RECTAL | Status: DC | PRN

## 2014-04-10 MED ORDER — HYDRALAZINE HCL 20 MG/ML IJ SOLN: 5.0000 mg | INTRAMUSCULAR | Status: DC | PRN

## 2014-04-10 MED ORDER — HYDRALAZINE HCL 20 MG/ML IJ SOLN
10.0000 mg | Freq: Once | INTRAMUSCULAR | Status: AC
  Administered 2014-04-10: 10 mg via INTRAVENOUS

## 2014-04-10 MED ORDER — EFAVIRENZ-EMTRICITAB-TENOFOVIR 600-200-300 MG PO TABS
1.0000 | ORAL_TABLET | Freq: Every day | ORAL | Status: DC
  Filled 2014-04-10 (×3): qty 1

## 2014-04-10 MED ORDER — HEPARIN SODIUM (PORCINE) 5000 UNIT/ML IJ SOLN
5000.0000 [IU] | Freq: Three times a day (TID) | INTRAMUSCULAR | Status: DC
  Administered 2014-04-10 – 2014-04-11 (×2): 5000 [IU] via SUBCUTANEOUS
  Filled 2014-04-10 (×4): qty 1

## 2014-04-10 MED ORDER — ENALAPRIL-HYDROCHLOROTHIAZIDE 10-25 MG PO TABS: 1.0000 | ORAL_TABLET | Freq: Every morning | ORAL | Status: DC

## 2014-04-10 MED ORDER — PANTOPRAZOLE SODIUM 40 MG PO TBEC
40.0000 mg | DELAYED_RELEASE_TABLET | Freq: Every day | ORAL | Status: DC
  Administered 2014-04-10 – 2014-04-11 (×2): 40 mg via ORAL
  Filled 2014-04-10 (×2): qty 1

## 2014-04-10 MED ORDER — ENALAPRIL MALEATE 10 MG PO TABS
10.0000 mg | ORAL_TABLET | Freq: Every day | ORAL | Status: DC
  Administered 2014-04-11: 10 mg via ORAL
  Filled 2014-04-10 (×2): qty 1

## 2014-04-10 MED ORDER — HYDRALAZINE HCL 20 MG/ML IJ SOLN
INTRAMUSCULAR | Status: AC
  Filled 2014-04-10: qty 1

## 2014-04-10 MED ORDER — KETOROLAC TROMETHAMINE 15 MG/ML IJ SOLN
15.0000 mg | Freq: Four times a day (QID) | INTRAMUSCULAR | Status: DC | PRN
  Filled 2014-04-10: qty 1

## 2014-04-10 MED ORDER — AMLODIPINE BESYLATE 10 MG PO TABS
10.0000 mg | ORAL_TABLET | Freq: Every day | ORAL | Status: DC
  Administered 2014-04-11: 10 mg via ORAL
  Filled 2014-04-10: qty 1

## 2014-04-10 MED ORDER — ACETAMINOPHEN 325 MG PO TABS: 650.0000 mg | ORAL_TABLET | Freq: Four times a day (QID) | ORAL | Status: DC | PRN

## 2014-04-10 MED ORDER — HYDROCHLOROTHIAZIDE 25 MG PO TABS
25.0000 mg | ORAL_TABLET | Freq: Every day | ORAL | Status: DC
  Administered 2014-04-11: 25 mg via ORAL
  Filled 2014-04-10: qty 1

## 2014-04-10 NOTE — Consult Note (Addendum)
Referring Physician: Nonda Lou    Chief Complaint: Weakness and numbness involving left side.  HPI: Justin Cox is an 33 y.o. male history of hypertension, hyperlipidemia, asthma and HIV infection presenting with acute onset of numbness and weakness involving his left arm and leg at 9:45 AM today. It is similar spell about one week ago which resolved overnight. CT scan of his head yesterday ordered by his primary care physician showed no acute intracranial abnormality. Repeat study today also showed no acute abnormality. Patient has been taking aspirin daily for antiplatelet therapy. Blood pressure was noted to be elevated on arrival 189/109. Patient's deficits improved after arriving in the emergency room. NIH stroke score was 2 with residual sensory changes.  LSN: 9:45 AM on 04/10/2014 tPA Given: No: Rapid improvement with minimal residual deficit MRankin: 0  Past Medical History  Diagnosis Date  . Hypertension   . High cholesterol   . HIV infection   . Asthma     Family History  Problem Relation Age of Onset  . Hypertension Other   . Hyperlipidemia Other   . Diabetes Other      Medications: I have reviewed the patient's current medications.  ROS: History obtained from the patient  General ROS: negative for - chills, fatigue, fever, night sweats, weight gain or weight loss Psychological ROS: negative for - behavioral disorder, hallucinations, memory difficulties, mood swings or suicidal ideation Ophthalmic ROS: negative for - blurry vision, double vision, eye pain or loss of vision ENT ROS: negative for - epistaxis, nasal discharge, oral lesions, sore throat, tinnitus or vertigo Allergy and Immunology ROS: negative for - hives or itchy/watery eyes Hematological and Lymphatic ROS: negative for - bleeding problems, bruising or swollen lymph nodes Endocrine ROS: negative for - galactorrhea, hair pattern changes, polydipsia/polyuria or temperature intolerance Respiratory ROS:  negative for - cough, hemoptysis, shortness of breath or wheezing Cardiovascular ROS: negative for - chest pain, dyspnea on exertion, edema or irregular heartbeat Gastrointestinal ROS: negative for - abdominal pain, diarrhea, hematemesis, nausea/vomiting or stool incontinence Genito-Urinary ROS: negative for - dysuria, hematuria, incontinence or urinary frequency/urgency Musculoskeletal ROS: negative for - joint swelling or muscular weakness Neurological ROS: as noted in HPI Dermatological ROS: negative for rash and skin lesion changes  Physical Examination: Blood pressure 168/115, pulse 77, temperature 98.3 F (36.8 C), temperature source Oral, resp. rate 19, height 5\' 11"  (1.803 m), weight 89.359 kg (197 lb), SpO2 95.00%.  Neurologic Examination: Mental Status: Alert, oriented, thought content appropriate.  Speech fluent without evidence of aphasia. Able to follow commands without difficulty. Cranial Nerves: II-Visual fields were normal. III/IV/VI-Pupils were equal and reacted. Extraocular movements were full and conjugate.    V/VII-no facial numbness and no facial weakness. VIII-normal. X-normal speech and symmetrical palatal movement. Motor: 5/5 bilaterally with normal tone and bulk Sensory: Reduced perception of tactile sensation over the entire left side compared to right. Deep Tendon Reflexes: 1+ and symmetric. Plantars: Mute bilaterally Cerebellar: Normal finger-to-nose testing.  Ct Head Wo Contrast  04/10/2014   CLINICAL DATA:  Hypertension.  Paresthesias in the arm.  EXAM: CT HEAD WITHOUT CONTRAST  TECHNIQUE: Contiguous axial images were obtained from the base of the skull through the vertex without intravenous contrast.  COMPARISON:  None.  FINDINGS: No mass lesion, mass effect, midline shift, hydrocephalus, hemorrhage. No territorial ischemia or acute infarction. Paranasal sinuses are within normal limits. Mastoid air cells clear.  IMPRESSION: Negative CT head.    Electronically Signed   By: Charolette Child.D.  On: 04/10/2014 12:26   Ct Head Wo Contrast  04/10/2014   CLINICAL DATA:  Headaches for 1 week  EXAM: CT HEAD WITHOUT CONTRAST  TECHNIQUE: Contiguous axial images were obtained from the base of the skull through the vertex without intravenous contrast.  COMPARISON:  None.  FINDINGS: There is no evidence of mass effect, midline shift or extra-axial fluid collections. There is no evidence of a space-occupying lesion or intracranial hemorrhage. There is no evidence of a cortical-based area of acute infarction.  The ventricles and sulci are appropriate for the patient's age. The basal cisterns are patent.  Visualized portions of the orbits are unremarkable. The visualized portions of the paranasal sinuses and mastoid air cells are unremarkable.  The osseous structures are unremarkable.  IMPRESSION: Normal CT of the brain.   Electronically Signed   By: Elige KoHetal  Patel   On: 04/10/2014 08:14    Assessment: 33 y.o. 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 A small vessel subcortical ischemic stroke cannot be ruled out at this point, however.  Stroke Risk Factors - hypertension and hyperlipidemia  Plan: 1. HgbA1c, fasting lipid panel 2. MRI, MRA  of the brain without contrast 3. PT consult, OT consult, Speech consult 4. Echocardiogram 5. Carotid dopplers 6. Prophylactic therapy-Antiplatelet med: Aspirin 325 mg per day 7. Risk factor modification 8. Studies to rule out possible hypercoagulable state  Patient's acute management required complex management decision-making as well as intervention, including management of hypertensive urgency.   C.R. Roseanne RenoStewart, MD [facility] [phone]  04/10/2014, 1:26 PM

## 2014-04-10 NOTE — ED Notes (Addendum)
Patient transported to CT 

## 2014-04-10 NOTE — ED Provider Notes (Signed)
CSN: [id number]     Arrival date & time 04/10/14  1049 History   First MD Initiated Contact with Patient 04/10/14 1059     Chief Complaint  Patient presents with  . Hypertension     (Consider location/radiation/quality/duration/timing/severity/associated sxs/prior Treatment) HPI  Patient states 5 days ago he was at work and had an episode of left-sided numbness/weakness in his left hand and blurred vision while at work. He was sent to the urgent care where he had an EKG. He states the worse part the symptoms lasted for about 4 1/2  hours however it took about 24 hours before the weakness in his left hand went back to normal. He was seen by his PCP yesterday for his first visit and had an outpatient head CT done which I can see in the chart and was normal. He reports today at work at about 10:00 AM he had acute onset of left arm and left leg numbness and weakness. He also states he has a mild left-sided headache behind his eyes. He states when he walks he is dragging his foot. He's having difficulty using his left hand to grasp things. He has some blurred vision. He denies nausea or vomiting.  Patient has had hypertension since 2006 that has not been well controlled. However he states today his blood pressure is the highest it has been.  FH HTN on both sides, no strokes   PCP Dr Nicholos Johns first visit yesterday  Past Medical History  Diagnosis Date  . Hypertension   . High cholesterol   . HIV infection   . Asthma    History reviewed. No pertinent past surgical history. Family History  Problem Relation Age of Onset  . Hypertension Other   . Hyperlipidemia Other   . Diabetes Other    History  Substance Use Topics  . Smoking status: Never Smoker   . Smokeless tobacco: Not on file  . Alcohol Use: Yes     Comment: Occasional  employed  Review of Systems  All other systems reviewed and are negative.     Allergies  Review of patient's allergies indicates no known  allergies.  Home Medications   Prior to Admission medications   Medication Sig Start Date End Date Taking? Authorizing Provider  albuterol (PROVENTIL HFA;VENTOLIN HFA) 108 (90 BASE) MCG/ACT inhaler Inhale 2 puffs into the lungs every 6 (six) hours as needed for wheezing or shortness of breath.   Yes Historical Provider, MD  amLODipine (NORVASC) 10 MG tablet Take 10 mg by mouth daily.  04/01/14  Yes Historical Provider, MD  aspirin EC 325 MG tablet Take 325 mg by mouth every 6 (six) hours as needed for pain.   Yes Historical Provider, MD  Chlorpheniramine-DM (CORICIDIN COUGH/COLD) 4-30 MG TABS Take 2 tablets by mouth every 6 (six) hours as needed (congestion).   Yes Historical Provider, MD  efavirenz-emtricitabine-tenofovir (ATRIPLA) 600-200-300 MG per tablet Take 1 tablet by mouth at bedtime.    Yes Historical Provider, MD  enalapril-hydrochlorothiazide (VASERETIC) 10-25 MG per tablet Take 1 tablet by mouth every morning.    Yes Historical Provider, MD  ibuprofen (ADVIL,MOTRIN) 200 MG tablet Take 400 mg by mouth every 6 (six) hours as needed for pain.   Yes Historical Provider, MD  metoprolol succinate (TOPROL-XL) 100 MG 24 hr tablet Take 100 mg by mouth daily. Take with or immediately following a meal.   Yes Historical Provider, MD  omeprazole (PRILOSEC) 20 MG capsule Take 1 capsule (20 mg total) by mouth  daily. 09/08/13  Yes Shari A Upstill, PA-C  pseudoephedrine-guaifenesin (MUCINEX D) 60-600 MG per tablet Take 2 tablets by mouth 2 (two) times daily as needed for congestion.   Yes Historical Provider, MD   BP 188/109  Pulse 111  Temp(Src) 98.3 F (36.8 C) (Oral)  Resp 18  Ht 5\' 11"  (1.803 m)  Wt 197 lb (89.359 kg)  BMI 27.49 kg/m2  SpO2 98%  Vital signs normal except for hypertension and tachycardia  Physical Exam  Nursing note and vitals reviewed. Constitutional: He is oriented to person, place, and time. He appears well-developed and well-nourished.  Non-toxic appearance. He does  not appear ill. No distress.  HENT:  Head: Normocephalic and atraumatic.  Right Ear: External ear normal.  Left Ear: External ear normal.  Nose: Nose normal. No mucosal edema or rhinorrhea.  Mouth/Throat: Oropharynx is clear and moist and mucous membranes are normal. No dental abscesses or uvula swelling.  Eyes: Conjunctivae and EOM are normal. Pupils are equal, round, and reactive to light.  Neck: Normal range of motion and full passive range of motion without pain. Neck supple.  Cardiovascular: Normal rate, regular rhythm and normal heart sounds.  Exam reveals no gallop and no friction rub.   No murmur heard. Pulmonary/Chest: Effort normal and breath sounds normal. No respiratory distress. He has no wheezes. He has no rhonchi. He has no rales. He exhibits no tenderness and no crepitus.  Abdominal: Soft. Normal appearance and bowel sounds are normal. He exhibits no distension. There is no tenderness. There is no rebound and no guarding.  Musculoskeletal: Normal range of motion. He exhibits no edema and no tenderness.  Moves all extremities well.   Neurological: He is alert and oriented to person, place, and time. He has normal strength. No cranial nerve deficit.  Patient has no facial asymmetry, patient has mild left pronator drift, he has mild left grip weakness, he is able to hold his left leg up against gravity however it appears to be difficult. Patient has difficulty flexing his left knee/hip. Patient has no loss of visual fields.  Skin: Skin is warm, dry and intact. No rash noted. No erythema. No pallor.  Psychiatric: He has a normal mood and affect. His speech is normal and behavior is normal. His mood appears not anxious.    ED Course  Procedures (including critical care time)  Review of our records show patient did have head CT done yesterday that was normal.  Code Stroke activated 12:00  1209 Dr. Roseanne Reno states to repeat his head CT today. He wants the patient to be sent to  [facility] ED after his CT and blood work were drawn. [facility] was on the line and  is sending a ambulance.  Patient informed of transfer.  1221 Dr. Judd Lien, pod A. at [facility] ED made aware of transfer.    Labs Review Results for orders placed during the hospital encounter of 04/10/14  ETHANOL      Result Value Ref Range   Alcohol, Ethyl (B) <11  0 - 11 mg/dL  PROTIME-INR      Result Value Ref Range   Prothrombin Time 13.1  11.6 - 15.2 seconds   INR 0.99  0.00 - 1.49  APTT      Result Value Ref Range   aPTT 28  24 - 37 seconds  CBC      Result Value Ref Range   WBC 5.3  4.0 - 10.5 K/uL   RBC 4.65  4.22 -  5.81 MIL/uL   Hemoglobin 16.1  13.0 - 17.0 g/dL   HCT 40.945.5  81.139.0 - 91.452.0 %   MCV 97.8  78.0 - 100.0 fL   MCH 34.6 (*) 26.0 - 34.0 pg   MCHC 35.4  30.0 - 36.0 g/dL   RDW 78.213.0  95.611.5 - 21.315.5 %   Platelets 140 (*) 150 - 400 K/uL  DIFFERENTIAL      Result Value Ref Range   Neutrophils Relative % 48  43 - 77 %   Neutro Abs 2.6  1.7 - 7.7 K/uL   Lymphocytes Relative 39  12 - 46 %   Lymphs Abs 2.1  0.7 - 4.0 K/uL   Monocytes Relative 11  3 - 12 %   Monocytes Absolute 0.6  0.1 - 1.0 K/uL   Eosinophils Relative 1  0 - 5 %   Eosinophils Absolute 0.1  0.0 - 0.7 K/uL   Basophils Relative 1  0 - 1 %   Basophils Absolute 0.0  0.0 - 0.1 K/uL  COMPREHENSIVE METABOLIC PANEL      Result Value Ref Range   Sodium 139  137 - 147 mEq/L   Potassium 4.0  3.7 - 5.3 mEq/L   Chloride 102  96 - 112 mEq/L   CO2 25  19 - 32 mEq/L   Glucose, Bld 108 (*) 70 - 99 mg/dL   BUN 8  6 - 23 mg/dL   Creatinine, Ser 0.860.81  0.50 - 1.35 mg/dL   Calcium 9.7  8.4 - 57.810.5 mg/dL   Total Protein 8.6 (*) 6.0 - 8.3 g/dL   Albumin 4.0  3.5 - 5.2 g/dL   AST 30  0 - 37 U/L   ALT 43  0 - 53 U/L   Alkaline Phosphatase 59  39 - 117 U/L   Total Bilirubin 0.2 (*) 0.3 - 1.2 mg/dL   GFR calc non Af Amer >90  >90 mL/min   GFR calc Af Amer >90  >90 mL/min   Anion gap 12  5 - 15  URINE RAPID DRUG SCREEN (HOSP PERFORMED)       Result Value Ref Range   Opiates NONE DETECTED  NONE DETECTED   Cocaine NONE DETECTED  NONE DETECTED   Benzodiazepines NONE DETECTED  NONE DETECTED   Amphetamines NONE DETECTED  NONE DETECTED   Tetrahydrocannabinol NONE DETECTED  NONE DETECTED   Barbiturates NONE DETECTED  NONE DETECTED  URINALYSIS, ROUTINE W REFLEX MICROSCOPIC      Result Value Ref Range   Color, Urine YELLOW  YELLOW   APPearance CLEAR  CLEAR   Specific Gravity, Urine 1.020  1.005 - 1.030   pH 6.0  5.0 - 8.0   Glucose, UA NEGATIVE  NEGATIVE mg/dL   Hgb urine dipstick TRACE (*) NEGATIVE   Bilirubin Urine NEGATIVE  NEGATIVE   Ketones, ur NEGATIVE  NEGATIVE mg/dL   Protein, ur NEGATIVE  NEGATIVE mg/dL   Urobilinogen, UA 0.2  0.0 - 1.0 mg/dL   Nitrite NEGATIVE  NEGATIVE   Leukocytes, UA NEGATIVE  NEGATIVE  TROPONIN I      Result Value Ref Range   Troponin I <0.30  <0.30 ng/mL  URINE MICROSCOPIC-ADD ON      Result Value Ref Range   RBC / HPF 3-6  <3 RBC/hpf  I-STAT CHEM 8, ED      Result Value Ref Range   Sodium 140  137 - 147 mEq/L   Potassium 3.8  3.7 - 5.3 mEq/L  Chloride 104  96 - 112 mEq/L   BUN 7  6 - 23 mg/dL   Creatinine, Ser 1.61  0.50 - 1.35 mg/dL   Glucose, Bld 096 (*) 70 - 99 mg/dL   Calcium, Ion 0.45  4.09 - 1.23 mmol/L   TCO2 25  0 - 100 mmol/L   Hemoglobin 17.0  13.0 - 17.0 g/dL   HCT 81.1  91.4 - 78.2 %   Laboratory interpretation all normal      Imaging Review Ct Head Wo Contrast  04/10/2014   CLINICAL DATA:  Headaches for 1 week  EXAM: CT HEAD WITHOUT CONTRAST  TECHNIQUE: Contiguous axial images were obtained from the base of the skull through the vertex without intravenous contrast.  COMPARISON:  None.  FINDINGS: There is no evidence of mass effect, midline shift or extra-axial fluid collections. There is no evidence of a space-occupying lesion or intracranial hemorrhage. There is no evidence of a cortical-based area of acute infarction.  The ventricles and sulci are appropriate  for the patient's age. The basal cisterns are patent.  Visualized portions of the orbits are unremarkable. The visualized portions of the paranasal sinuses and mastoid air cells are unremarkable.  The osseous structures are unremarkable.  IMPRESSION: Normal CT of the brain.   Electronically Signed   By: Elige Ko   On: 04/10/2014 08:14     EKG Interpretation   Date/Time:  Thursday April 10 2014 10:59:40 EDT Ventricular Rate:  100 PR Interval:  137 QRS Duration: 87 QT Interval:  341 QTC Calculation: 440 R Axis:     Text Interpretation:  Sinus tachycardia LVH by voltage Borderline T  abnormalities, inferior leads Anterior ST elevation, probably due to LVH  Since last tracing rate faster (29 Mar 2006) T wave inversion now evident  in Lateral leads Reconfirmed by [facility]  MD-I, Camauri Craton ([id number]) on 04/10/2014  12:19:10 PM      MDM   Final diagnoses:  Left sided numbness  Left-sided weakness  Essential hypertension    Transfer to [facility] ED to see the stroke team   Devoria Albe, MD, Franz Dell, MD 04/10/14 [phone]

## 2014-04-10 NOTE — ED Notes (Signed)
Pt states was seen at urgent care for high blood pressure and arm tingling.  Pt states went back to work today and same occurred.

## 2014-04-10 NOTE — ED Provider Notes (Signed)
Pt transferred from [facility] as code CVA w/ waxing/waning L sided numbness/tingling/weakness for about 9 days. CT head nml, but had significant atherosclerosis for age.  On my exam, he reports dec sensation to fine touch L face, L arm, L leg. No objective weakness. Pt seen by Dr. Roseanne Reno w/ neurology who is recommended further w/u with medicine. He does not qualify for TPA at this point.   1. Left sided numbness   2. Left-sided weakness   3. Essential hypertension   4. HIV infection   5. Hyperlipidemia   6. Hypertensive urgency   7. Transient cerebral ischemia, unspecified transient cerebral ischemia type        Shanna Cisco, MD 04/10/14 1528

## 2014-04-10 NOTE — Code Documentation (Addendum)
32yo male arriving to [facility] via [facility] at 68.  Reports of left sided weakness and HTN.  Patient reports that he was lightheaded when he woke up this morning, but had no weakness.  He reports the the weakness began before 1000.  LKW 0945. Patient reports a similar event on Tuesday where symptoms resolved.  CT scan and labs completed at [facility].  NIHSS 2 on arrival to [facility], see documentation for details and times.  Patient reports that his weakness has improved.  Patient reports decreased sensation on the left side.  Dr. Roseanne Reno at bedside for assessment.  Patient is too mild to treat at this time per Dr. Roseanne Reno, but will remain in the window to treat with tPA until 1415 should symptoms worsen.  BP 177/114, Hydralazine IVP ordered.  Plan of care is to admit for workup.  Bedside handoff with ED RN Wille Celeste.

## 2014-04-10 NOTE — H&P (Signed)
[facility] History and Physical  Justin Cox NFA:[id number] DOB: 13-Apr-1981 DOA: 04/10/2014  Referring physician: Emergency Department PCP: Georgianne Fick, MD  Specialists:   Chief Complaint: L sided numbness  HPI: Justin Cox is a 33 y.o. male  With a hx of migraines, htn, hld who presents to the ED with acute L sided numbness that started around 10am on the day of admission. The patient reports mild L LE numbness that has been gradually improving over time. En route to the ED, the patient was given ASA. Initial CT head was neg for acute process. Given concerns of CVA, the patient was transferred from [facility] to [facility] for further evaluation. On further questioning, pt reports similar episode one week ago, associated with his usual migraine. On this episode, pt reports L sided headache associated with photophobia, no phonophobia. No nausea. Neurology was consulted and hospitalist consulted for consideration for admission,.  Review of Systems:   Per above, the remainder of the 10pt ros  Past Medical History  Diagnosis Date  . Hypertension   . High cholesterol   . HIV infection   . Asthma    History reviewed. No pertinent past surgical history. Social History:  reports that he has never smoked. He does not have any smokeless tobacco history on file. He reports that he drinks alcohol. He reports that he does not use illicit drugs.  where does patient live--home, ALF, SNF? and with whom if at home?  Can patient participate in ADLs?  No Known Allergies  Family History  Problem Relation Age of Onset  . Hypertension Other   . Hyperlipidemia Other   . Diabetes Other     (be sure to complete)  Prior to Admission medications   Medication Sig Start Date End Date Taking? Authorizing Provider  albuterol (PROVENTIL HFA;VENTOLIN HFA) 108 (90 BASE) MCG/ACT inhaler Inhale 2 puffs into the lungs every 6 (six) hours as needed for wheezing or shortness of breath.   Yes Historical Provider, MD   amLODipine (NORVASC) 10 MG tablet Take 10 mg by mouth daily.  04/01/14  Yes Historical Provider, MD  aspirin EC 325 MG tablet Take 325 mg by mouth every 6 (six) hours as needed for pain.   Yes Historical Provider, MD  Chlorpheniramine-DM (CORICIDIN COUGH/COLD) 4-30 MG TABS Take 2 tablets by mouth every 6 (six) hours as needed (congestion).   Yes Historical Provider, MD  efavirenz-emtricitabine-tenofovir (ATRIPLA) 600-200-300 MG per tablet Take 1 tablet by mouth at bedtime.    Yes Historical Provider, MD  enalapril-hydrochlorothiazide (VASERETIC) 10-25 MG per tablet Take 1 tablet by mouth every morning.    Yes Historical Provider, MD  ibuprofen (ADVIL,MOTRIN) 200 MG tablet Take 400 mg by mouth every 6 (six) hours as needed for pain.   Yes Historical Provider, MD  metoprolol succinate (TOPROL-XL) 100 MG 24 hr tablet Take 100 mg by mouth daily. Take with or immediately following a meal.   Yes Historical Provider, MD  omeprazole (PRILOSEC) 20 MG capsule Take 1 capsule (20 mg total) by mouth daily. 09/08/13  Yes Shari A Upstill, PA-C  pseudoephedrine-guaifenesin (MUCINEX D) 60-600 MG per tablet Take 2 tablets by mouth 2 (two) times daily as needed for congestion.   Yes Historical Provider, MD   Physical Exam: Filed Vitals:   04/10/14 1330 04/10/14 1345 04/10/14 1400 04/10/14 1410  BP: 173/105  157/104   Pulse: 87 94 89 87  Temp:      TempSrc:      Resp: 19 19 18  19  Height:      Weight:      SpO2: 96% 93% 92% 96%     General:  Awake, in nad  Eyes: PERRL B  ENT: membranes moist, dentition fair  Neck: trachea midline, neck supple  Cardiovascular: Regular, s1, s2  Respiratory: normal resp effort, no wheezing  Abdomen: soft, nondistended  Skin: normal skin turgor, no abnormal skin lesions seen  Musculoskeletal: Perfused, no clubbing  Psychiatric: mood/affect normal// no auditory/visual hallucinations  Neurologic: L sided numbness with 4+/5 LUE and LLE weakness, 5/5  strength/elsewhere  Labs on Admission:  Basic Metabolic Panel:  Recent Labs Lab 04/10/14 1216 04/10/14 1223  NA 139 140  K 4.0 3.8  CL 102 104  CO2 25  --   GLUCOSE 108* 108*  BUN 8 7  CREATININE 0.81 0.90  CALCIUM 9.7  --    Liver Function Tests:  Recent Labs Lab 04/10/14 1216  AST 30  ALT 43  ALKPHOS 59  BILITOT 0.2*  PROT 8.6*  ALBUMIN 4.0   No results found for this basename: LIPASE, AMYLASE,  in the last 168 hours No results found for this basename: AMMONIA,  in the last 168 hours CBC:  Recent Labs Lab 04/10/14 1216 04/10/14 1223  WBC 5.3  --   NEUTROABS 2.6  --   HGB 16.1 17.0  HCT 45.5 50.0  MCV 97.8  --   PLT 140*  --    Cardiac Enzymes:  Recent Labs Lab 04/10/14 1216  TROPONINI <0.30    BNP (last 3 results) No results found for this basename: PROBNP,  in the last 8760 hours CBG: No results found for this basename: GLUCAP,  in the last 168 hours  Radiological Exams on Admission: Ct Head Wo Contrast  04/10/2014   CLINICAL DATA:  Hypertension.  Paresthesias in the arm.  EXAM: CT HEAD WITHOUT CONTRAST  TECHNIQUE: Contiguous axial images were obtained from the base of the skull through the vertex without intravenous contrast.  COMPARISON:  None.  FINDINGS: No mass lesion, mass effect, midline shift, hydrocephalus, hemorrhage. No territorial ischemia or acute infarction. Paranasal sinuses are within normal limits. Mastoid air cells clear.  IMPRESSION: Negative CT head.   Electronically Signed   By: Andreas Newport M.D.   On: 04/10/2014 12:26   Ct Head Wo Contrast  04/10/2014   CLINICAL DATA:  Headaches for 1 week  EXAM: CT HEAD WITHOUT CONTRAST  TECHNIQUE: Contiguous axial images were obtained from the base of the skull through the vertex without intravenous contrast.  COMPARISON:  None.  FINDINGS: There is no evidence of mass effect, midline shift or extra-axial fluid collections. There is no evidence of a space-occupying lesion or intracranial  hemorrhage. There is no evidence of a cortical-based area of acute infarction.  The ventricles and sulci are appropriate for the patient's age. The basal cisterns are patent.  Visualized portions of the orbits are unremarkable. The visualized portions of the paranasal sinuses and mastoid air cells are unremarkable.  The osseous structures are unremarkable.  IMPRESSION: Normal CT of the brain.   Electronically Signed   By: Elige Ko   On: 04/10/2014 08:14    Assessment/Plan Active Problems:   Hyperlipidemia   HIV infection   Hypertensive urgency   TIA (transient ischemic attack)   1. L sided weakness 1. Initial head CT neg for acute process 2. Neurology consulted 3. MRI brain, 2d echo, carotid dopplers ordered, pending 4. Cont on ASA for now 5. Question if presenting sx  are related to known hx of migraines, as pt had similar episode one week prior related to migraine 6. Also consider hypertensive crisis as pt's presenting BP was in the crisis range 2. Migraines 1. Cont on prn Ketoralac 2. Pt still reports L sided headache at this time 3. HIV 1. Cont antivirals 4. HLD 1. Will check fasting lipid in AM 2. Not on statin currently 5. HTN 1. Poorly controlled 2. Cont home meds 3. Will add PRN hydralazine 6. DVT prophylaxis 1. Heparin subQ  Code Status: Full (must indicate code status--if unknown or must be presumed, indicate so) Family Communication: Pt in room (indicate person spoken with, if applicable, with phone number if by telephone) Disposition Plan: Pending (indicate anticipated LOS)  Time spent: 40min  CHIU, STEPHEN K [facility] Pager [phone]  If 7PM-7AM, please contact night-coverage [email or web address] Password [email or web address] 04/10/2014, 2:26 PM

## 2014-04-10 NOTE — ED Notes (Signed)
Pt was brought to [place] by [facility] from [facility].  Pt has hx of HTN and has had HA x 3 days.

## 2014-04-10 NOTE — ED Notes (Signed)
Attempted to call report.  RN phone unavailable.  Will call in 5 min.

## 2014-04-10 NOTE — ED Notes (Signed)
Pt c/o hypertension and L arm and L leg tingling/weakness starting around 0930.  Pt reports same symptoms x 9 days ago, which resolved within 24 hrs.

## 2014-04-10 NOTE — ED Notes (Signed)
$  8 and wallet placed in pt pants pocket.

## 2014-04-11 ENCOUNTER — Observation Stay (HOSPITAL_COMMUNITY): Payer: BC Managed Care – PPO

## 2014-04-11 DIAGNOSIS — I379 Nonrheumatic pulmonary valve disorder, unspecified: Secondary | ICD-10-CM

## 2014-04-11 LAB — CBC
HEMATOCRIT: 44.3 % (ref 39.0–52.0)
HEMOGLOBIN: 15.6 g/dL (ref 13.0–17.0)
MCH: 34.9 pg — AB (ref 26.0–34.0)
MCHC: 35.2 g/dL (ref 30.0–36.0)
MCV: 99.1 fL (ref 78.0–100.0)
PLATELETS: 147 10*3/uL — AB (ref 150–400)
RBC: 4.47 MIL/uL (ref 4.22–5.81)
RDW: 13.2 % (ref 11.5–15.5)
WBC: 7 10*3/uL (ref 4.0–10.5)

## 2014-04-11 LAB — COMPREHENSIVE METABOLIC PANEL
ALBUMIN: 3.8 g/dL (ref 3.5–5.2)
ALT: 40 U/L (ref 0–53)
AST: 25 U/L (ref 0–37)
Alkaline Phosphatase: 56 U/L (ref 39–117)
Anion gap: 14 (ref 5–15)
BUN: 8 mg/dL (ref 6–23)
CALCIUM: 9.2 mg/dL (ref 8.4–10.5)
CO2: 23 mEq/L (ref 19–32)
CREATININE: 0.84 mg/dL (ref 0.50–1.35)
Chloride: 102 mEq/L (ref 96–112)
GFR calc Af Amer: >90 mL/min (ref >90)
GFR calc non Af Amer: >90 mL/min (ref >90)
Glucose, Bld: 114 mg/dL — ABNORMAL HIGH (ref 70–99)
Potassium: 3.8 mEq/L (ref 3.7–5.3)
SODIUM: 139 meq/L (ref 137–147)
TOTAL PROTEIN: 8.2 g/dL (ref 6.0–8.3)
Total Bilirubin: 0.4 mg/dL (ref 0.3–1.2)

## 2014-04-11 LAB — TSH: TSH: 0.649 u[IU]/mL (ref 0.350–4.500)

## 2014-04-11 LAB — LIPID PANEL
Cholesterol: 220 mg/dL — ABNORMAL HIGH (ref 0–200)
HDL: 42 mg/dL (ref >39)
LDL Cholesterol: 121 mg/dL — ABNORMAL HIGH (ref 0–99)
Total CHOL/HDL Ratio: 5.2 RATIO
Triglycerides: 287 mg/dL — ABNORMAL HIGH (ref <150)
VLDL: 57 mg/dL — ABNORMAL HIGH (ref 0–40)

## 2014-04-11 LAB — T4, FREE: FREE T4: 1.11 ng/dL (ref 0.80–1.80)

## 2014-04-11 LAB — HEMOGLOBIN A1C
Hgb A1c MFr Bld: 5.6 % (ref <5.7)
Mean Plasma Glucose: 114 mg/dL (ref <117)

## 2014-04-11 LAB — VITAMIN B12: VITAMIN B 12: 336 pg/mL (ref 211–911)

## 2014-04-11 LAB — FOLATE: Folate: 7.6 ng/mL

## 2014-04-11 LAB — SEDIMENTATION RATE: Sed Rate: 8 mm/hr (ref 0–16)

## 2014-04-11 MED ORDER — ASPIRIN EC 325 MG PO TBEC
325.0000 mg | DELAYED_RELEASE_TABLET | Freq: Every day | ORAL | Status: DC
  Administered 2014-04-11: 325 mg via ORAL
  Filled 2014-04-11: qty 1

## 2014-04-11 MED ORDER — SIMVASTATIN 20 MG PO TABS
20.0000 mg | ORAL_TABLET | Freq: Every day | ORAL | Status: DC
  Filled 2014-04-11: qty 1

## 2014-04-11 MED ORDER — IOHEXOL 350 MG/ML SOLN
50.0000 mL | Freq: Once | INTRAVENOUS | Status: AC | PRN
  Administered 2014-04-11: 50 mL via INTRAVENOUS

## 2014-04-11 MED ORDER — ALBUTEROL SULFATE (2.5 MG/3ML) 0.083% IN NEBU: 3.0000 mL | INHALATION_SOLUTION | Freq: Four times a day (QID) | RESPIRATORY_TRACT | Status: DC | PRN

## 2014-04-11 MED ORDER — ASPIRIN 325 MG PO TBEC: 325.0000 mg | DELAYED_RELEASE_TABLET | Freq: Every day | ORAL | Status: DC

## 2014-04-11 MED ORDER — SIMVASTATIN 20 MG PO TABS: 20.0000 mg | ORAL_TABLET | Freq: Every day | ORAL | Status: DC

## 2014-04-11 NOTE — Progress Notes (Signed)
Stroke Team Progress Note  HISTORY 33 y.o. male history of uncontrolled hypertension, hyperlipidemia, asthma and HIV infection presenting with acute onset 2 episodes of numbness and weakness involving his left arm and leg, resolved in about 2-3 hours.  CT scan of his head the day prior to admission ordered by his primary care physician showed no acute intracranial abnormality. Repeat study the day of admission also showed no acute abnormality. Patient had been taking aspirin daily for antiplatelet therapy. Blood pressure was noted to be elevated on arrival 189/109. Patient's deficits improved after arriving in the emergency room. NIH stroke score was 2 with residual sensory changes.   He has been having HTN for 10 years but only recently follow up at outpt working up for the etiology of HTN. His HTN was not controlled in the past.   LSN: 9:45 AM on 04/10/2014  tPA Given: No: Rapid improvement with minimal residual deficit  MRankin: 0  SUBJECTIVE No family members present. The patient reports a history of migraine headaches as well as hypertension which has not been well controlled. Recent upper respiratory tract infection. Currently back to his baseline. No headache or fever.  OBJECTIVE Most recent Vital Signs: Filed Vitals:   04/11/14 0000 04/11/14 0200 04/11/14 0400 04/11/14 1300  BP: 131/93 128/94 133/91 134/89  Pulse: 103 95 92 90  Temp:   97.6 F (36.4 C) 97.9 F (36.6 C)  TempSrc:   Axillary Oral  Resp: 18  18 18   Height:      Weight:   197 lb 3.8 oz (89.465 kg)   SpO2: 95%  96% 98%   CBG (last 3)  No results found for this basename: GLUCAP,  in the last 72 hours  IV Fluid Intake:    MEDICATIONS   PRN:      Diet:    thin liquids Activity:   DVT Prophylaxis:  Subcutaneous heparin  CLINICALLY SIGNIFICANT STUDIES Basic Metabolic Panel:   Recent Labs Lab 04/10/14 1216 04/10/14 1223 04/11/14 0640  NA 139 140 139  K 4.0 3.8 3.8  CL 102 104 102  CO2 25  --  23   GLUCOSE 108* 108* 114*  BUN 8 7 8   CREATININE 0.81 0.90 0.84  CALCIUM 9.7  --  9.2   Liver Function Tests:   Recent Labs Lab 04/10/14 1216 04/11/14 0640  AST 30 25  ALT 43 40  ALKPHOS 59 56  BILITOT 0.2* 0.4  PROT 8.6* 8.2  ALBUMIN 4.0 3.8   CBC:   Recent Labs Lab 04/10/14 1216 04/10/14 1223 04/11/14 0640  WBC 5.3  --  7.0  NEUTROABS 2.6  --   --   HGB 16.1 17.0 15.6  HCT 45.5 50.0 44.3  MCV 97.8  --  99.1  PLT 140*  --  147*   Coagulation:   Recent Labs Lab 04/10/14 1216  LABPROT 13.1  INR 0.99   Cardiac Enzymes:   Recent Labs Lab 04/10/14 1216  TROPONINI <0.30   Urinalysis:   Recent Labs Lab 04/10/14 1224  COLORURINE YELLOW  LABSPEC 1.020  PHURINE 6.0  GLUCOSEU NEGATIVE  HGBUR TRACE*  BILIRUBINUR NEGATIVE  KETONESUR NEGATIVE  PROTEINUR NEGATIVE  UROBILINOGEN 0.2  NITRITE NEGATIVE  LEUKOCYTESUR NEGATIVE   Lipid Panel    Component Value Date/Time   CHOL 220* 04/11/2014 0854   TRIG 287* 04/11/2014 0854   HDL 42 04/11/2014 0854   CHOLHDL 5.2 04/11/2014 0854   VLDL 57* 04/11/2014 0854   LDLCALC 121* 04/11/2014 78290854  HgbA1C  Lab Results  Component Value Date   HGBA1C 5.6 04/11/2014    Urine Drug Screen:     Component Value Date/Time   LABOPIA NONE DETECTED 04/10/2014 1224   COCAINSCRNUR NONE DETECTED 04/10/2014 1224   LABBENZ NONE DETECTED 04/10/2014 1224   AMPHETMU NONE DETECTED 04/10/2014 1224   THCU NONE DETECTED 04/10/2014 1224   LABBARB NONE DETECTED 04/10/2014 1224    Alcohol Level:   Recent Labs Lab 04/10/14 1216  ETH <11    Ct Angio Head W/cm &/or Wo Cm 04/11/2014    Negative CTA head and neck.    Ct Head Wo Contrast 04/10/2014    Negative CT head.     Ct Angio Neck W/cm &/or Wo/cm 04/11/2014    Negative CTA head and neck.     Mr Brain Wo Contrast 04/10/2014    1.  Normal MRI appearance of the brain.  2. Mild to moderate paranasal sinus inflammatory changes. Physiologic appearing adenoid and tonsillar hypertrophy.     2D Echocardiogram  - Left ventricle: The cavity size was normal. Wall thickness was increased in a pattern of moderate LVH. Systolic function was normal. The estimated ejection fraction was in the range of 50% to 55%. Possible hypokinesis of the basal posterior myocardium. Doppler parameters are consistent with abnormal left ventricular relaxation (grade 1 diastolic dysfunction).  Impressions:  - Low normal LV function; possible hypokinesis of the basal posterior myocardium; no significant valvular abnormality.   EKG -  Sinus tachycardia rate 100 beats per minute. LVH by voltage. Borderline T abnormalities, inferior leads. Anterior ST elevation, probably due to LVH. Since last tracing rate faster (29 Mar 2006) T wave inversion now evident in Lateral leads. For complete results please see formal report.   Therapy Recommendations pending  Physical Exam Physical exam  Temp:  [97.6 F (36.4 C)-97.9 F (36.6 C)] 97.9 F (36.6 C) (07/24 1300) Pulse Rate:  [90-103] 90 (07/24 1300) Resp:  [18] 18 (07/24 1300) BP: (128-145)/(89-94) 134/89 mmHg (07/24 1300) SpO2:  [95 %-98 %] 98 % (07/24 1300) Weight:  [197 lb 3.8 oz (89.465 kg)] 197 lb 3.8 oz (89.465 kg) (07/24 0400)  General - overweight, well developed, in no apparent distress.  Ophthalmologic - Sharp disc margins OU.  Cardiovascular - Regular rate and rhythm with no murmur.  No nuchal rigidity.  Neurologic Examination:  Mental Status:  Alert, oriented, thought content appropriate. Speech fluent without evidence of aphasia. Able to follow commands without difficulty.  Cranial Nerves:  II-Visual fields were normal.  III/IV/VI-Pupils were equal and reacted. Extraocular movements were full and conjugate.  V/VII-no facial numbness and no facial weakness.  VIII-normal.  X-normal speech and symmetrical palatal movement.  Motor: 5/5 bilaterally with normal tone and bulk  Sensory: Reduced perception of tactile sensation over the  entire left side compared to right.  Deep Tendon Reflexes: 1+ and symmetric.  Plantars: Mute bilaterally  Cerebellar: Normal finger-to-nose testing.  ASSESSMENT [name] is a 33 y.o. male presenting with transient speech difficulties and left hemiparesis with hemisensory deficits. T-PA therapy was not initiated secondary to rapidly resolving deficits. Two episode involves left arm and leg, concerning for small vessel disease. He has risk factor of HTN and HLD. HTN not in control and currently working up for cause. LDL 112. On no antithrombotics prior to admission. Now on aspirin 325 mg orally every day for secondary stroke prevention. Patient with resultant resolution of deficits. Stroke work up complete.   Hypertension  Hyperlipidemia -  cholesterol 220; LDL 121. No statins prior to admission. Now on simvastatin 20 mg daily.  History of migraine headaches  HIV-positive  Probable TIAs  Hospital day # 1  TREATMENT/PLAN  Continue aspirin 325 mg orally every day for secondary stroke prevention.  Continue statin for stroke prevention and HLD.  Await therapy evaluations  Closely follow up with PCP to find cause of HTN.  Stroke risk factor modification, follow up with PCP in one week after discharge.  Followup in 2 months with Dr Roda Shutters.  Delton See PA-C [facility] Pager [phone] 04/11/2014, 10:17 PM   SIGNED I, the attending vascular neurologist, have personally obtained a history, examined the patient, evaluated laboratory data, individually viewed imaging studies, and formulated the assessment and plan of care.  I have made any additions or clarifications directly to the above note and agree with the findings and plan as currently documented.   Marvel Plan, MD PhD 04/11/2014 10:24 PM  To contact Stroke Continuity provider, please refer to [email or web address]. After hours, contact General Neurology

## 2014-04-11 NOTE — Discharge Summary (Addendum)
Physician Discharge Summary  Justin Cox SWN:[id number] DOB: [birth year] DOA: 04/10/2014  PCP: Merrilee Seashore, MD  Admit date: 04/10/2014 Discharge date: 04/11/2014  Time spent: Less than 30 minutes  Recommendations for Outpatient Follow-up:  1. Dr. Merrilee Seashore, PCP in 4 days for post hospital discharge followup and management of hypertension. Consider outpatient cardiology consultation for evaluation of echo findings (possible hypokinesis of the basal posterior myocardium) 2. Dr. Rosalin Hawking, Neurology in 2 months. 3. Dr. Ivette Loyal, ID  Discharge Diagnoses:  Principal Problem:   TIA (transient ischemic attack) Active Problems:   Hyperlipidemia   HIV infection   Hypertensive urgency   Discharge Condition: Improved & Stable  Diet recommendation: Heart healthy diet.  Filed Weights   04/10/14 1253 04/10/14 1514 04/11/14 0400  Weight: 89.359 kg (197 lb) 89.2 kg (196 lb 10.4 oz) 89.465 kg (197 lb 3.8 oz)    History of present illness:  33 year old African American male patient with history of HIV (follows with Dr. Lynett Grimes at [facility], [place]), hypertension, hypercholesterolemia, asthma, presented to the ED with complaints of left-sided facial and extremity numbness, left sided blurred vision which started approximately 9:45 AM on 7/23 while at work at [employer]. Symptoms resolved after about 10 hours. He gives history of similar symptoms 3 days ago when he was seen at an urgent care center and symptoms resolved spontaneously. He was treated for hypertension at the urgent care Center. He denies headache, fever or chills. CT head negative for acute process.  Hospital Course:   1. 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 TPA was not indicated secondary to a rapidly resolving deficits. He was not on antithrombotics prior to admission (he was however taking aspirin when necessary for pain). Stroke workup was  initiated. CT head without contrast x2: Negative. CT angiogram of head and neck: Negative. MRI brain: Normal. LDL 121. ESR 8. TSH 0.649. UDS negative. Blood alcohol level less than 11. Neurology was consulted and recommended aspirin 325 mg daily for secondary stroke prophylaxis and a statin. Since he had no deficits, no PT, OT and ST evaluation was pursued. In the absence of headache, neck pain, fever, neck stiffness and spontaneous resolution, meningoencephalitis was felt less likely. This may be due to poorly controlled HTN. Other differential diagnosis-migraine. Outpatient followup with neurology in 2 months. 2. Uncontrolled hypertension: Patient states that he has had high blood pressures since 2006. Family history of hypertension in parents. Continue amlodipine, metoprolol, HCTZ and lisinopril. These medications may need titration as outpatient. May consider evaluation for secondary causes as outpatient. 3. HIV: Continue anti-virals and outpatient followup. 4. Hyperlipidemia: Started simvastatin. 5. History of migraine: No headaches.   Consultations:  Neurology  Procedures:  None    Discharge Exam:  Complaints:  Denies any further weakness, numbness of left side of his body. No headaches reported. Denies any complaints.  Filed Vitals:   04/11/14 0000 04/11/14 0200 04/11/14 0400 04/11/14 1300  BP: 131/93 128/94 133/91 134/89  Pulse: 103 95 92 90  Temp:   97.6 F (36.4 C) 97.9 F (36.6 C)  TempSrc:   Axillary Oral  Resp: 18  18 18   Height:      Weight:   89.465 kg (197 lb 3.8 oz)   SpO2: 95%  96% 98%    General exam: Pleasant young male lying comfortably in bed.  Respiratory system: Clear. No increased work of breathing.  Cardiovascular system: S1 & S2 heard, RRR. No JVD, murmurs, gallops, clicks or pedal  edema. Telemetry: Sinus rhythm and sinus tachycardia in the 110s. Occasional sinus tachycardia in the 140s associated with activity.  Gastrointestinal system: Abdomen is  nondistended, soft and nontender. Normal bowel sounds heard.  Central nervous system: Alert and oriented. No focal neurological deficits.  Extremities: Symmetric 5 x 5 power.   Discharge Instructions      Discharge Instructions   Call MD for:    Complete by:  As directed   Stroke like symptoms.     Diet - low sodium heart healthy    Complete by:  As directed      Increase activity slowly    Complete by:  As directed             Medication List    STOP taking these medications       CORICIDIN COUGH/COLD 4-30 MG Tabs  Generic drug:  Chlorpheniramine-DM     pseudoephedrine-guaifenesin 60-600 MG per tablet  Commonly known as:  MUCINEX D      TAKE these medications       albuterol 108 (90 BASE) MCG/ACT inhaler  Commonly known as:  PROVENTIL HFA;VENTOLIN HFA  Inhale 2 puffs into the lungs every 6 (six) hours as needed for wheezing or shortness of breath.     amLODipine 10 MG tablet  Commonly known as:  NORVASC  Take 10 mg by mouth daily.     aspirin 325 MG EC tablet  Take 1 tablet (325 mg total) by mouth daily.     efavirenz-emtricitabine-tenofovir 600-200-300 MG per tablet  Commonly known as:  ATRIPLA  Take 1 tablet by mouth at bedtime.     enalapril-hydrochlorothiazide 10-25 MG per tablet  Commonly known as:  VASERETIC  Take 1 tablet by mouth every morning.     ibuprofen 200 MG tablet  Commonly known as:  ADVIL,MOTRIN  Take 400 mg by mouth every 6 (six) hours as needed for pain.     metoprolol succinate 100 MG 24 hr tablet  Commonly known as:  TOPROL-XL  Take 100 mg by mouth daily. Take with or immediately following a meal.     omeprazole 20 MG capsule  Commonly known as:  PRILOSEC  Take 1 capsule (20 mg total) by mouth daily.     simvastatin 20 MG tablet  Commonly known as:  ZOCOR  Take 1 tablet (20 mg total) by mouth daily at 6 PM.       Follow-up Information   Follow up with Xu,Jindong, MD. Schedule an appointment as soon as possible for a visit  in 2 months.   Specialty:  Neurology   Contact information:   [address] [place] [address] [phone]       Follow up with [facility], MD. Schedule an appointment as soon as possible for a visit in 4 days. ([place] hospital discharge follow up. FU High BP management.)    Specialty:  Internal Medicine   Contact information:   [address] [place] [address] [phone]        The results of significant diagnostics from this hospitalization (including imaging, microbiology, ancillary and laboratory) are listed below for reference.    Significant Diagnostic Studies: Ct Angio Head W/cm &/or Wo Cm  04/11/2014   CLINICAL DATA:  Left-sided weakness.  EXAM: CT ANGIOGRAPHY HEAD AND NECK  TECHNIQUE: Multidetector CT imaging of the head and neck was performed using the standard protocol during bolus administration of intravenous contrast. Multiplanar CT image reconstructions and MIPs were obtained to  evaluate the vascular anatomy. Carotid stenosis measurements (when applicable) are obtained utilizing NASCET criteria, using the distal internal carotid diameter as the denominator.  CONTRAST:  30m OMNIPAQUE IOHEXOL 350 MG/ML SOLN  COMPARISON:  MRI head 04/10/2014.  CT head 04/10/2014  FINDINGS: CTA HEAD FINDINGS  Ventricle size is normal. No acute infarct or mass. Negative for hemorrhage.  Both vertebral arteries are patent to the basilar. PICA patent bilaterally. Basilar widely patent. Superior cerebellar and posterior cerebral arteries are patent bilaterally.  Internal carotid artery is widely patent bilaterally without significant atherosclerotic disease. Anterior and middle cerebral arteries are widely patent.  Negative for cerebral aneurysm.  Review of the MIP images confirms the above findings.  CTA NECK FINDINGS  The aortic arch is normal with standard 3 vessel branching. Proximal great vessels widely patent.  Carotid artery widely patent  bilaterally. No evidence of atherosclerotic disease or dissection.  Both vertebral arteries are widely patent without stenosis or dissection.  Review of the MIP images confirms the above findings.  IMPRESSION: Negative CTA head and neck.   Electronically Signed   By: CFranchot GalloM.D.   On: 04/11/2014 08:27   Ct Head Wo Contrast  04/10/2014   CLINICAL DATA:  Hypertension.  Paresthesias in the arm.  EXAM: CT HEAD WITHOUT CONTRAST  TECHNIQUE: Contiguous axial images were obtained from the base of the skull through the vertex without intravenous contrast.  COMPARISON:  None.  FINDINGS: No mass lesion, mass effect, midline shift, hydrocephalus, hemorrhage. No territorial ischemia or acute infarction. Paranasal sinuses are within normal limits. Mastoid air cells clear.  IMPRESSION: Negative CT head.   Electronically Signed   By: GDereck LigasM.D.   On: 04/10/2014 12:26   Ct Head Wo Contrast  04/10/2014   CLINICAL DATA:  Headaches for 1 week  EXAM: CT HEAD WITHOUT CONTRAST  TECHNIQUE: Contiguous axial images were obtained from the base of the skull through the vertex without intravenous contrast.  COMPARISON:  None.  FINDINGS: There is no evidence of mass effect, midline shift or extra-axial fluid collections. There is no evidence of a space-occupying lesion or intracranial hemorrhage. There is no evidence of a cortical-based area of acute infarction.  The ventricles and sulci are appropriate for the patient's age. The basal cisterns are patent.  Visualized portions of the orbits are unremarkable. The visualized portions of the paranasal sinuses and mastoid air cells are unremarkable.  The osseous structures are unremarkable.  IMPRESSION: Normal CT of the brain.   Electronically Signed   By: HKathreen Devoid  On: 04/10/2014 08:14   Ct Angio Neck W/cm &/or Wo/cm  04/11/2014   CLINICAL DATA:  Left-sided weakness.  EXAM: CT ANGIOGRAPHY HEAD AND NECK  TECHNIQUE: Multidetector CT imaging of the head and neck was  performed using the standard protocol during bolus administration of intravenous contrast. Multiplanar CT image reconstructions and MIPs were obtained to evaluate the vascular anatomy. Carotid stenosis measurements (when applicable) are obtained utilizing NASCET criteria, using the distal internal carotid diameter as the denominator.  CONTRAST:  538mOMNIPAQUE IOHEXOL 350 MG/ML SOLN  COMPARISON:  MRI head 04/10/2014.  CT head 04/10/2014  FINDINGS: CTA HEAD FINDINGS  Ventricle size is normal. No acute infarct or mass. Negative for hemorrhage.  Both vertebral arteries are patent to the basilar. PICA patent bilaterally. Basilar widely patent. Superior cerebellar and posterior cerebral arteries are patent bilaterally.  Internal carotid artery is widely patent bilaterally without significant atherosclerotic disease. Anterior and middle cerebral arteries are widely  patent.  Negative for cerebral aneurysm.  Review of the MIP images confirms the above findings.  CTA NECK FINDINGS  The aortic arch is normal with standard 3 vessel branching. Proximal great vessels widely patent.  Carotid artery widely patent bilaterally. No evidence of atherosclerotic disease or dissection.  Both vertebral arteries are widely patent without stenosis or dissection.  Review of the MIP images confirms the above findings.  IMPRESSION: Negative CTA head and neck.   Electronically Signed   By: Franchot Gallo M.D.   On: 04/11/2014 08:27   Mr Brain Wo Contrast  04/10/2014   CLINICAL DATA:  33 year old male with acute left side numbness. Hypertension. Headache. Initial encounter.  EXAM: MRI HEAD WITHOUT CONTRAST  TECHNIQUE: Multiplanar, multiecho pulse sequences of the brain and surrounding structures were obtained without intravenous contrast.  COMPARISON:  Head CT without contrast 1222 hr today.  FINDINGS: Cerebral volume is normal. No restricted diffusion to suggest acute infarction. No midline shift, mass effect, evidence of mass lesion,  ventriculomegaly, extra-axial collection or acute intracranial hemorrhage. Cervicomedullary junction and pituitary are within normal limits. Negative visualized cervical spine. Major intracranial vascular flow voids are preserved. Pearline Cables and white matter signal is within normal limits throughout the brain.  Normal bone marrow signal. Small volume retained secretions in the nasopharynx where there is mild adenoid hypertrophy. There is similar symmetric tonsillar hypertrophy. Favor these are physiologic for this patient. 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 There is mild mucosal thickening, bubbly opacity, and occasional fluid levels in the paranasal sinuses.  Visualized orbit soft tissues are within normal limits. Mastoids are clear. Visible internal auditory structures appear normal. Visualized scalp soft tissues are within normal limits.  IMPRESSION: 1.  Normal MRI appearance of the brain. 2. Mild to moderate paranasal sinus inflammatory changes. Physiologic appearing adenoid and tonsillar hypertrophy.   Electronically Signed   By: Lars Pinks M.D.   On: 04/10/2014 21:32    Microbiology: No results found for this or any previous visit (from the past 240 hour(s)).   Labs: Basic Metabolic Panel:  Recent Labs Lab 04/10/14 1216 04/10/14 1223 04/11/14 0640  NA 139 140 139  K 4.0 3.8 3.8  CL 102 104 102  CO2 25  --  23  GLUCOSE 108* 108* 114*  BUN 8 7 8   CREATININE 0.81 0.90 0.84  CALCIUM 9.7  --  9.2   Liver Function Tests:  Recent Labs Lab 04/10/14 1216 04/11/14 0640  AST 30 25  ALT 43 40  ALKPHOS 59 56  BILITOT 0.2* 0.4  PROT 8.6* 8.2  ALBUMIN 4.0 3.8   No results found for this basename: LIPASE, AMYLASE,  in the last 168 hours No results found for this basename: AMMONIA,  in the last 168 hours CBC:  Recent Labs Lab 04/10/14 1216 04/10/14 1223 04/11/14 0640  WBC 5.3  --  7.0  NEUTROABS 2.6  --   --   HGB 16.1 17.0 15.6  HCT 45.5 50.0 44.3  MCV 97.8  --  99.1   PLT 140*  --  147*   Cardiac Enzymes:  Recent Labs Lab 04/10/14 1216  TROPONINI <0.30   BNP: BNP (last 3 results) No results found for this basename: PROBNP,  in the last 8760 hours CBG: No results found for this basename: GLUCAP,  in the last 168 hours  Additional labs:  Fasting lipids: Cholesterol 220, triglycerides 287, HDL 42, LDL 121 and VLDL 57.   ESR: 8   TSH: 0.649. Free  T4: 1.11   UDS: Negative. Blood alcohol level <11  Folate: 7.6, B12: 336  Hemoglobin A1c: 5.6  2-D echo 04/11/14: Study Conclusions  - Left ventricle: The cavity size was normal. Wall thickness was increased in a pattern of moderate LVH. Systolic function was normal. The estimated ejection fraction was in the range of 50% to 55%. Possible hypokinesis of the basal posterior myocardium. Doppler parameters are consistent with abnormal left ventricular relaxation (grade 1 diastolic dysfunction).  Impressions:  - Low normal LV function; possible hypokinesis of the basal posterior myocardium; no significant valvular abnormality.   Addendum:  On 04/14/2014, discussed with Dr. Ivette Loyal, primary ID at [facility]. Patient has been treated multiple times for syphilis and she stated that current presentation was less likely to be due to Neuro Syphilis and probably due to his uncontrolled HTN. She will follow OP.   Signed:  Vernell Leep, MD, FACP, FHM. [facility] Pager [phone]  If 7PM-7AM, please contact night-coverage [email or web address] Password [facility] 04/11/2014, 3:29 PM

## 2014-04-11 NOTE — Progress Notes (Signed)
UR Completed [place] Scheiderer Graves-Bigelow, RN,BSN [phone]  

## 2014-04-11 NOTE — Progress Notes (Signed)
  Echocardiogram 2D Echocardiogram has been performed.  Leta Jungling M 04/11/2014, 12:23 PM

## 2014-04-11 NOTE — Progress Notes (Signed)
PROGRESS NOTE    Justin Cox VPX:[id number] DOB: Dec 06, 1980 DOA: 04/10/2014 PCP: Merrilee Seashore, MD  HPI/Brief narrative 33 year old African American male patient with history of HIV (follows with Dr. Lynett Grimes at [facility]), hypertension, hypercholesterolemia, asthma, presented to the ED with complaints of left-sided facial and extremity numbness, left sided blurred vision which started approximately 9:45 AM on 7/23 while at work at [employer]. Symptoms resolved after about 10 hours. He gives history of similar symptoms 3 days ago when he was seen at an urgent care center and symptoms resolved spontaneously a. He was treated for hypertension at the urgent care Center. He denies headache, fever or chills. CT head negative for acute process.   Assessment/Plan:  1. Probable recurrent TIA: Other DD-complex migraine. Resolved symptoms. Stroke workup initiated. CT head without contrast x2: Negative. CT angiogram of head and neck: Negative. MRI brain: Normal. Followup 2-D echo results. LDL 121. ESR 8. TSH 0.649. UDS negative. Blood alcohol level less than 11. Neurology consultation and followup appreciated. Discussed with neurology who recommended aspirin 325 mg daily for secondary stroke prophylaxis and a statin. Outpatient followup with neurology in 2 months. 2. Uncontrolled hypertension: Patient states that he has had high blood pressures since 2006. Family history of hypertension in parents. Continue amlodipine, metoprolol, HCTZ and lisinopril. These medications may need titration as outpatient. May consider evaluation for secondary causes as outpatient. 3. HIV: Continue anti-virals and outpatient followup. 4. Hyperlipidemia: Started simvastatin. 5. History of migraine: No headaches.   Code Status: Full Family Communication: None at bedside. Disposition Plan: Home possibly this evening pending echo results.   Consultants:  Neurology  Procedures:  None    Antibiotics:  None   Subjective: Resolved left-sided numbness and visual problems. Denies headache or any other complaints.  Objective: Filed Vitals:   04/10/14 2230 04/11/14 0000 04/11/14 0200 04/11/14 0400  BP: 145/93 131/93 128/94 133/91  Pulse: 90 103 95 92  Temp:    97.6 F (36.4 C)  TempSrc:    Axillary  Resp:  18  18  Height:      Weight:    89.465 kg (197 lb 3.8 oz)  SpO2:  95%  96%   No intake or output data in the 24 hours ending 04/11/14 1230 Filed Weights   04/10/14 1253 04/10/14 1514 04/11/14 0400  Weight: 89.359 kg (197 lb) 89.2 kg (196 lb 10.4 oz) 89.465 kg (197 lb 3.8 oz)     Exam:  General exam: Pleasant young male lying comfortably in bed. Respiratory system: Clear. No increased work of breathing. Cardiovascular system: S1 & S2 heard, RRR. No JVD, murmurs, gallops, clicks or pedal edema. Telemetry: Sinus rhythm and sinus tachycardia in the 110s. Occasional sinus tachycardia in the 140s associated with activity. Gastrointestinal system: Abdomen is nondistended, soft and nontender. Normal bowel sounds heard. Central nervous system: Alert and oriented. No focal neurological deficits. Extremities: Symmetric 5 x 5 power.   Data Reviewed: Basic Metabolic Panel:  Recent Labs Lab 04/10/14 1216 04/10/14 1223 04/11/14 0640  NA 139 140 139  K 4.0 3.8 3.8  CL 102 104 102  CO2 25  --  23  GLUCOSE 108* 108* 114*  BUN _0 CREATININE 0.81 0.90 0.84  CALCIUM 9.7  --  9.2   Liver Function Tests:  Recent Labs Lab 04/10/14 1216 04/11/14 0640  AST 30 25  ALT 43 40  ALKPHOS 59 56  BILITOT 0.2* 0.4  PROT 8.6* 8.2  ALBUMIN 4.0 3.8  No results found for this basename: LIPASE, AMYLASE,  in the last 168 hours No results found for this basename: AMMONIA,  in the last 168 hours CBC:  Recent Labs Lab 04/10/14 1216 04/10/14 1223 04/11/14 0640  WBC 5.3  --  7.0  NEUTROABS 2.6  --   --   HGB 16.1 17.0 15.6  HCT 45.5 50.0 44.3  MCV 97.8  --   99.1  PLT 140*  --  147*   Cardiac Enzymes:  Recent Labs Lab 04/10/14 1216  TROPONINI <0.30   BNP (last 3 results) No results found for this basename: PROBNP,  in the last 8760 hours CBG: No results found for this basename: GLUCAP,  in the last 168 hours  No results found for this or any previous visit (from the past 240 hour(s)).    Additional labs: 1. Fasting lipids: Cholesterol 220, triglycerides 287, HDL 42, LDL 121 and VLDL 57. 2. ESR: 8 3. TSH: 0.649. 4. UDS: Negative. Blood alcohol level <11     Studies: Ct Angio Head W/cm &/or Wo Cm  04/11/2014   CLINICAL DATA:  Left-sided weakness.  EXAM: CT ANGIOGRAPHY HEAD AND NECK  TECHNIQUE: Multidetector CT imaging of the head and neck was performed using the standard protocol during bolus administration of intravenous contrast. Multiplanar CT image reconstructions and MIPs were obtained to evaluate the vascular anatomy. Carotid stenosis measurements (when applicable) are obtained utilizing NASCET criteria, using the distal internal carotid diameter as the denominator.  CONTRAST:  69m OMNIPAQUE IOHEXOL 350 MG/ML SOLN  COMPARISON:  MRI head 04/10/2014.  CT head 04/10/2014  FINDINGS: CTA HEAD FINDINGS  Ventricle size is normal. No acute infarct or mass. Negative for hemorrhage.  Both vertebral arteries are patent to the basilar. PICA patent bilaterally. Basilar widely patent. Superior cerebellar and posterior cerebral arteries are patent bilaterally.  Internal carotid artery is widely patent bilaterally without significant atherosclerotic disease. Anterior and middle cerebral arteries are widely patent.  Negative for cerebral aneurysm.  Review of the MIP images confirms the above findings.  CTA NECK FINDINGS  The aortic arch is normal with standard 3 vessel branching. Proximal great vessels widely patent.  Carotid artery widely patent bilaterally. No evidence of atherosclerotic disease or dissection.  Both vertebral arteries are widely  patent without stenosis or dissection.  Review of the MIP images confirms the above findings.  IMPRESSION: Negative CTA head and neck.   Electronically Signed   By: CFranchot GalloM.D.   On: 04/11/2014 08:27   Ct Head Wo Contrast  04/10/2014   CLINICAL DATA:  Hypertension.  Paresthesias in the arm.  EXAM: CT HEAD WITHOUT CONTRAST  TECHNIQUE: Contiguous axial images were obtained from the base of the skull through the vertex without intravenous contrast.  COMPARISON:  None.  FINDINGS: No mass lesion, mass effect, midline shift, hydrocephalus, hemorrhage. No territorial ischemia or acute infarction. Paranasal sinuses are within normal limits. Mastoid air cells clear.  IMPRESSION: Negative CT head.   Electronically Signed   By: GDereck LigasM.D.   On: 04/10/2014 12:26   Ct Head Wo Contrast  04/10/2014   CLINICAL DATA:  Headaches for 1 week  EXAM: CT HEAD WITHOUT CONTRAST  TECHNIQUE: Contiguous axial images were obtained from the base of the skull through the vertex without intravenous contrast.  COMPARISON:  None.  FINDINGS: There is no evidence of mass effect, midline shift or extra-axial fluid collections. There is no evidence of a space-occupying lesion or intracranial hemorrhage. There is no evidence  of a cortical-based area of acute infarction.  The ventricles and sulci are appropriate for the patient's age. The basal cisterns are patent.  Visualized portions of the orbits are unremarkable. The visualized portions of the paranasal sinuses and mastoid air cells are unremarkable.  The osseous structures are unremarkable.  IMPRESSION: Normal CT of the brain.   Electronically Signed   By: Kathreen Devoid   On: 04/10/2014 08:14   Ct Angio Neck W/cm &/or Wo/cm  04/11/2014   CLINICAL DATA:  Left-sided weakness.  EXAM: CT ANGIOGRAPHY HEAD AND NECK  TECHNIQUE: Multidetector CT imaging of the head and neck was performed using the standard protocol during bolus administration of intravenous contrast. Multiplanar CT  image reconstructions and MIPs were obtained to evaluate the vascular anatomy. Carotid stenosis measurements (when applicable) are obtained utilizing NASCET criteria, using the distal internal carotid diameter as the denominator.  CONTRAST:  73m OMNIPAQUE IOHEXOL 350 MG/ML SOLN  COMPARISON:  MRI head 04/10/2014.  CT head 04/10/2014  FINDINGS: CTA HEAD FINDINGS  Ventricle size is normal. No acute infarct or mass. Negative for hemorrhage.  Both vertebral arteries are patent to the basilar. PICA patent bilaterally. Basilar widely patent. Superior cerebellar and posterior cerebral arteries are patent bilaterally.  Internal carotid artery is widely patent bilaterally without significant atherosclerotic disease. Anterior and middle cerebral arteries are widely patent.  Negative for cerebral aneurysm.  Review of the MIP images confirms the above findings.  CTA NECK FINDINGS  The aortic arch is normal with standard 3 vessel branching. Proximal great vessels widely patent.  Carotid artery widely patent bilaterally. No evidence of atherosclerotic disease or dissection.  Both vertebral arteries are widely patent without stenosis or dissection.  Review of the MIP images confirms the above findings.  IMPRESSION: Negative CTA head and neck.   Electronically Signed   By: CFranchot GalloM.D.   On: 04/11/2014 08:27   Mr Brain Wo Contrast  04/10/2014   CLINICAL DATA:  33year old male with acute left side numbness. Hypertension. Headache. Initial encounter.  EXAM: MRI HEAD WITHOUT CONTRAST  TECHNIQUE: Multiplanar, multiecho pulse sequences of the brain and surrounding structures were obtained without intravenous contrast.  COMPARISON:  Head CT without contrast 1222 hr today.  FINDINGS: Cerebral volume is normal. No restricted diffusion to suggest acute infarction. No midline shift, mass effect, evidence of mass lesion, ventriculomegaly, extra-axial collection or acute intracranial hemorrhage. Cervicomedullary junction and  pituitary are within normal limits. Negative visualized cervical spine. Major intracranial vascular flow voids are preserved. GPearline Cablesand white matter signal is within normal limits throughout the brain.  Normal bone marrow signal. Small volume retained secretions in the nasopharynx where there is mild adenoid hypertrophy. There is similar symmetric tonsillar hypertrophy. Favor these are physiologic for this patient. 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 There is mild mucosal thickening, bubbly opacity, and occasional fluid levels in the paranasal sinuses.  Visualized orbit soft tissues are within normal limits. Mastoids are clear. Visible internal auditory structures appear normal. Visualized scalp soft tissues are within normal limits.  IMPRESSION: 1.  Normal MRI appearance of the brain. 2. Mild to moderate paranasal sinus inflammatory changes. Physiologic appearing adenoid and tonsillar hypertrophy.   Electronically Signed   By: LLars PinksM.D.   On: 04/10/2014 21:32        Scheduled Meds: . amLODipine  10 mg Oral Daily  . efavirenz-emtricitabine-tenofovir  1 tablet Oral QHS  . enalapril  10 mg Oral Daily  . heparin  5,000 Units  Subcutaneous 3 times per day  . hydrochlorothiazide  25 mg Oral Daily  . metoprolol succinate  100 mg Oral Daily  . pantoprazole  40 mg Oral Daily  . simvastatin  20 mg Oral q1800   Continuous Infusions:   Principal Problem:   TIA (transient ischemic attack) Active Problems:   Hyperlipidemia   HIV infection   Hypertensive urgency    Time spent: 30 minutes    Tenia Goh, MD, FACP, FHM. [facility] Pager [phone]  If 7PM-7AM, please contact night-coverage [email or web address] Password [email or web address] 04/11/2014, 12:30 PM    LOS: 1 day

## 2014-04-11 NOTE — Progress Notes (Signed)
Pts assessment unchanged from this am. D/c'd home with family and friends in stable condition

## 2014-04-11 NOTE — Discharge Instructions (Addendum)
Stroke Prevention Some medical conditions and behaviors are associated with an increased chance of having a stroke. You may prevent a stroke by making healthy choices and managing medical conditions. HOW CAN I REDUCE MY RISK OF HAVING A STROKE?   Stay physically active. Get at least 30 minutes of activity on most or all days.  Do not smoke. It may also be helpful to avoid exposure to secondhand smoke.  Limit alcohol use. Moderate alcohol use is considered to be:  No more than 2 drinks per day for men.  No more than 1 drink per day for nonpregnant women.  Eat healthy foods. This involves:  Eating 5 or more servings of fruits and vegetables a day.  Making dietary changes that address high blood pressure (hypertension), high cholesterol, diabetes, or obesity.  Manage your cholesterol levels.  Making food choices that are high in fiber and low in saturated fat, trans fat, and cholesterol may control cholesterol levels.  Take any prescribed medicines to control cholesterol as directed by your health care provider.  Manage your diabetes.  Controlling your carbohydrate and sugar intake is recommended to manage diabetes.  Take any prescribed medicines to control diabetes as directed by your health care provider.  Control your hypertension.  Making food choices that are low in salt (sodium), saturated fat, trans fat, and cholesterol is recommended to manage hypertension.  Take any prescribed medicines to control hypertension as directed by your health care provider.  Maintain a healthy weight.  Reducing calorie intake and making food choices that are low in sodium, saturated fat, trans fat, and cholesterol are recommended to manage weight.  Stop drug abuse.  Avoid taking birth control pills.  Talk to your health care provider about the risks of taking birth control pills if you are over 35 years old, smoke, get migraines, or have ever had a blood clot.  Get evaluated for sleep  disorders (sleep apnea).  Talk to your health care provider about getting a sleep evaluation if you snore a lot or have excessive sleepiness.  Take medicines only as directed by your health care provider.  For some people, aspirin or blood thinners (anticoagulants) are helpful in reducing the risk of forming abnormal blood clots that can lead to stroke. If you have the irregular heart rhythm of atrial fibrillation, you should be on a blood thinner unless there is a good reason you cannot take them.  Understand all your medicine instructions.  Make sure that other conditions (such as anemia or atherosclerosis) are addressed. SEEK IMMEDIATE MEDICAL CARE IF:   You have sudden weakness or numbness of the face, arm, or leg, especially on one side of the body.  Your face or eyelid droops to one side.  You have sudden confusion.  You have trouble speaking (aphasia) or understanding.  You have sudden trouble seeing in one or both eyes.  You have sudden trouble walking.  You have dizziness.  You have a loss of balance or coordination.  You have a sudden, severe headache with no known cause.  You have new chest pain or an irregular heartbeat. Any of these symptoms may represent a serious problem that is an emergency. Do not wait to see if the symptoms will go away. Get medical help at once. Call your local emergency services (911 in [place] Do not drive yourself to the hospital. Document Released: 10/13/2004 Document Revised: 01/20/2014 Document Reviewed: 03/08/2013 [employer] Patient Information 2015 [employer]. This information is not intended to replace advice given   to you by your health care provider. Make sure you discuss any questions you have with your health care provider.   Ischemic Stroke A stroke (cerebrovascular accident) is the sudden death of brain tissue. It is a medical emergency. A stroke can cause permanent loss of brain function. This can cause problems with different  parts of your body. A transient ischemic attack (TIA) is different because it does not cause permanent damage. A TIA is a short-lived problem of poor blood flow affecting a part of the brain. A TIA is also a serious problem because having a TIA greatly increases the chances of having a stroke. When symptoms first develop, you cannot know if the problem might be a stroke or a TIA. CAUSES  A stroke is caused by a decrease of oxygen supply to an area of your brain. It is usually the result of a small blood clot or collection of cholesterol or fat (plaque) that blocks blood flow in the brain. A stroke can also be caused by blocked or damaged carotid arteries.  RISK FACTORS  High blood pressure (hypertension).  High cholesterol.  Diabetes mellitus.  Heart disease.  The buildup of plaque in the blood vessels (peripheral artery disease or atherosclerosis).  The buildup of plaque in the blood vessels providing blood and oxygen to the brain (carotid artery stenosis).  An abnormal heart rhythm (atrial fibrillation).  Obesity.  Smoking.  Taking oral contraceptives (especially in combination with smoking).  Physical inactivity.  A diet high in fats, salt (sodium), and calories.  Alcohol use.  Use of illegal drugs (especially cocaine and methamphetamine).  Being African American.  Being over the age of 55.  Family history of stroke.  Previous history of blood clots, stroke, TIA, or heart attack.  Sickle cell disease. SYMPTOMS  These symptoms usually develop suddenly, or may be newly present upon awakening from sleep:  Sudden weakness or numbness of the face, arm, or leg, especially on one side of the body.  Sudden trouble walking or difficulty moving arms or legs.  Sudden confusion.  Sudden personality changes.  Trouble speaking (aphasia) or understanding.  Difficulty swallowing.  Sudden trouble seeing in one or both eyes.  Double vision.  Dizziness.  Loss of balance  or coordination.  Sudden severe headache with no known cause.  Trouble reading or writing. DIAGNOSIS  Your health care provider can often determine the presence or absence of a stroke based on your symptoms, history, and physical exam. Computed tomography (CT) of the brain is usually performed to confirm the stroke, determine causes, and determine stroke severity. Other tests may be done to find the cause of the stroke. These tests may include:  Electrocardiography.  Continuous heart monitoring.  Echocardiography.  Carotid ultrasonography.  Magnetic resonance imaging (MRI).  A scan of the brain circulation.  Blood tests. PREVENTION  The risk of a stroke can be decreased by appropriately treating high blood pressure, high cholesterol, diabetes, heart disease, and obesity and by quitting smoking, limiting alcohol, and staying physically active. TREATMENT  Time is of the essence. It is important to seek treatment at the first sign of these symptoms because you may receive a medicine to dissolve the clot (thrombolytic) that cannot be given if too much time has passed since your symptoms began. Even if you do not know when your symptoms began, get treatment as soon as possible as there are other treatment options available including oxygen, intravenous (IV) fluids, and medicines to thin the blood (anticoagulants). Treatment of   stroke depends on the duration, severity, and cause of your symptoms. Medicines and dietary changes may be used to address diabetes, high blood pressure, and other risk factors. Physical, speech, and occupational therapists will assess you and work with you to improve any functions impaired by the stroke. Measures will be taken to prevent short-term and long-term complications, including infection from breathing foreign material into the lungs (aspiration pneumonia), blood clots in the legs, bedsores, and falls. Rarely, surgery may be needed to remove large blood clots or to  open up blocked arteries. HOME CARE INSTRUCTIONS   Take medicines only as directed by your health care provider. Follow the directions carefully. Medicines may be used to control risk factors for a stroke. Be sure you understand all your medicine instructions.  You may be told to take a medicine to thin the blood, such as aspirin or the anticoagulant warfarin. Warfarin needs to be taken exactly as instructed.  Too much and too little warfarin are both dangerous. Too much warfarin increases the risk of bleeding. Too little warfarin continues to allow the risk for blood clots. While taking warfarin, you will need to have regular blood tests to measure your blood clotting time. These blood tests usually include both the PT and INR tests. The PT and INR results allow your health care provider to adjust your dose of warfarin. The dose can change for many reasons. It is critically important that you take warfarin exactly as prescribed, and that you have your PT and INR levels drawn exactly as directed.  Many foods, especially foods high in vitamin K, can interfere with warfarin and affect the PT and INR results. Foods high in vitamin K include spinach, kale, broccoli, cabbage, collard and turnip greens, brussels sprouts, peas, cauliflower, seaweed, and parsley, as well as beef and pork liver, green tea, and soybean oil. You should eat a consistent amount of foods high in vitamin K. Avoid major changes in your diet, or notify your health care provider before changing your diet. Arrange a visit with a dietitian to answer your questions.  Many medicines can interfere with warfarin and affect the PT and INR results. You must tell your health care provider about any and all medicines you take. This includes all vitamins and supplements. Be especially cautious with aspirin and anti-inflammatory medicines. Do not take or discontinue any prescribed or over-the-counter medicine except on the advice of your health care  provider or pharmacist.  Warfarin can have side effects, such as excessive bruising or bleeding. You will need to hold pressure over cuts for longer than usual. Your health care provider or pharmacist will discuss other potential side effects.  Avoid sports or activities that may cause injury or bleeding.  Be mindful when shaving, flossing your teeth, or handling sharp objects.  Alcohol can change the body's ability to handle warfarin. It is best to avoid alcoholic drinks or consume only very small amounts while taking warfarin. Notify your health care provider if you change your alcohol intake.  Notify your dentist or other health care providers before procedures.  If swallow studies have determined that your swallowing reflex is present, you should eat healthy foods. Including 5 or more servings of fruits and vegetables a day may reduce the risk of stroke. Foods may need to be a certain consistency (soft or pureed), or small bites may need to be taken in order to avoid aspirating or choking. Certain dietary changes may be advised to address high blood pressure, high cholesterol, diabetes,   or obesity.  Food choices that are low in sodium, saturated fat, trans fat, and cholesterol are recommended to manage high blood pressure.  Food choies that are high in fiber, and low in saturated fat, trans fat, and cholesterol may control cholesterol levels.  Controlling carbohydrates and sugar intake is recommended to manage diabetes.  Reducing calorie intake and making food choices that are low in sodium, saturated fat, trans fat, and cholesterol are recommended to manage obesity.  Maintain a healthy weight.  Stay physically active. It is recommended that you get at least 30 minutes of activity on all or most days.  Do not use any tobacco products including cigarettes, chewing tobacco, or electronic cigarettes.  Limit alcohol use even if you are not taking warfarin. Moderate alcohol use is  considered to be:  No more than 2 drinks each day for men.  No more than 1 drink each day for nonpregnant women.  Home safety. A safe home environment is important to reduce the risk of falls. Your health care provider may arrange for specialists to evaluate your home. Having grab bars in the bedroom and bathroom is often important. Your health care provider may arrange for equipment to be used at home, such as raised toilets and a seat for the shower.  Physical, occupational, and speech therapy. Ongoing therapy may be needed to maximize your recovery after a stroke. If you have been advised to use a walker or a cane, use it at all times. Be sure to keep your therapy appointments.  Follow all instructions for follow-up with your health care provider. This is very important. This includes any referrals, physical therapy, rehabilitation, and lab tests. Proper follow-up can prevent another stroke from occurring. SEEK MEDICAL CARE IF:  You have personality changes.  You have difficulty swallowing.  You are seeing double.  You have dizziness.  You have a fever.  You have skin breakdown. SEEK IMMEDIATE MEDICAL CARE IF:  Any of these symptoms may represent a serious problem that is an emergency. Do not wait to see if the symptoms will go away. Get medical help right away. Call your local emergency services (911 in [place] Do not drive yourself to the hospital.  You have sudden weakness or numbness of the face, arm, or leg, especially on one side of the body.  You have sudden trouble walking or difficulty moving arms or legs.  You have sudden confusion.  You have trouble speaking (aphasia) or understanding.  You have sudden trouble seeing in one or both eyes.  You have a loss of balance or coordination.  You have a sudden, severe headache with no known cause.  You have new chest pain or an irregular heartbeat.  You have a partial or total loss of consciousness. Document Released:  09/05/2005 Document Revised: 01/20/2014 Document Reviewed: 04/15/2012 [employer] Patient Information 2015 [employer]. This information is not intended to replace advice given to you by your health care provider. Make sure you discuss any questions you have with your health care provider.  

## 2014-04-12 LAB — RPR: RPR Ser Ql: REACTIVE — AB

## 2014-04-12 LAB — HIGH SENSITIVITY CRP: CRP, High Sensitivity: 5.5 mg/L — ABNORMAL HIGH

## 2014-04-12 LAB — RPR TITER: RPR Titer: 1:4 {titer} — AB

## 2014-04-14 LAB — FLUORESCENT TREPONEMAL AB(FTA)-IGG-BLD: Fluorescent Treponemal Ab, IgG: REACTIVE — AB

## 2014-04-17 ENCOUNTER — Other Ambulatory Visit: Payer: Self-pay | Admitting: Internal Medicine

## 2014-04-17 DIAGNOSIS — I10 Essential (primary) hypertension: Secondary | ICD-10-CM

## 2014-04-22 ENCOUNTER — Ambulatory Visit
Admission: RE | Admit: 2014-04-22 | Discharge: 2014-04-22 | Disposition: A | Discharge status: Home / Self Care | Payer: BC Managed Care – PPO | Source: Ambulatory Visit | Attending: Internal Medicine | Admitting: Internal Medicine

## 2014-04-22 DIAGNOSIS — I10 Essential (primary) hypertension: Secondary | ICD-10-CM

## 2014-05-06 ENCOUNTER — Telehealth: Payer: Self-pay | Admitting: Cardiovascular Disease

## 2014-05-09 ENCOUNTER — Encounter: Payer: Self-pay | Admitting: Internal Medicine

## 2014-05-09 ENCOUNTER — Ambulatory Visit (INDEPENDENT_AMBULATORY_CARE_PROVIDER_SITE_OTHER): Payer: BC Managed Care – PPO | Admitting: Internal Medicine

## 2014-05-09 VITALS — BP 130/82 | HR 93 | Ht 70.0 in | Wt 202.0 lb

## 2014-05-09 DIAGNOSIS — R9389 Abnormal findings on diagnostic imaging of other specified body structures: Secondary | ICD-10-CM

## 2014-05-09 DIAGNOSIS — E785 Hyperlipidemia, unspecified: Secondary | ICD-10-CM

## 2014-05-09 DIAGNOSIS — R9431 Abnormal electrocardiogram [ECG] [EKG]: Secondary | ICD-10-CM | POA: Insufficient documentation

## 2014-05-09 DIAGNOSIS — B20 Human immunodeficiency virus [HIV] disease: Secondary | ICD-10-CM

## 2014-05-09 DIAGNOSIS — R079 Chest pain, unspecified: Secondary | ICD-10-CM

## 2014-05-09 DIAGNOSIS — Z21 Asymptomatic human immunodeficiency virus [HIV] infection status: Secondary | ICD-10-CM

## 2014-05-09 DIAGNOSIS — R0989 Other specified symptoms and signs involving the circulatory and respiratory systems: Secondary | ICD-10-CM

## 2014-05-09 DIAGNOSIS — I10 Essential (primary) hypertension: Secondary | ICD-10-CM

## 2014-05-09 DIAGNOSIS — I160 Hypertensive urgency: Secondary | ICD-10-CM

## 2014-05-09 DIAGNOSIS — R931 Abnormal findings on diagnostic imaging of heart and coronary circulation: Secondary | ICD-10-CM

## 2014-05-09 DIAGNOSIS — G458 Other transient cerebral ischemic attacks and related syndromes: Secondary | ICD-10-CM

## 2014-05-09 DIAGNOSIS — R072 Precordial pain: Secondary | ICD-10-CM

## 2014-05-09 DIAGNOSIS — R0609 Other forms of dyspnea: Secondary | ICD-10-CM

## 2014-05-09 NOTE — Telephone Encounter (Signed)
Closed encounter °

## 2014-05-09 NOTE — Progress Notes (Signed)
OFFICE NOTE  Chief Complaint:  Chest pain, DOE, recent abnormal echo  Primary Care Physician: [name],[name], MD  HPI:  Justin Cox is a pleasant 33 year old male with past medical history 7 for long-standing hypertension, dyslipidemia, 2 recent TIAs, and HIV disease on medication with an undetectable viral load. He recently has been in the hospital for a second TIA. He had medication adjustments. An echocardiogram was performed which demonstrated a low normal EF of 50-55% with possible posterior basal wall motion abnormality. He is described increased frequency of chest pressure and shortness of breath with exertion over the past several weeks. 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 He does not exercise and is fairly sedentary when he is not working. There is no significant family history of heart disease however hypertension is somewhat rampant in the family.  PMHx:  Past Medical History  Diagnosis Date  . Hypertension   . High cholesterol   . HIV infection   . Asthma     History reviewed. No pertinent past surgical history.  FAMHx:  Family History  Problem Relation Age of Onset  . Hypertension Other   . Hyperlipidemia Other   . Diabetes Other     SOCHx:   reports that he quit smoking about 3 years ago. He does not have any smokeless tobacco history on file. He reports that he drinks about 1.5 ounces of alcohol per week. He reports that he does not use illicit drugs.  ALLERGIES:  No Known Allergies  ROS: A comprehensive review of systems was negative except for: Respiratory: positive for dyspnea on exertion Cardiovascular: positive for chest pressure/discomfort  HOME MEDS: Current Outpatient Prescriptions  Medication Sig Dispense Refill  . albuterol (PROVENTIL HFA;VENTOLIN HFA) 108 (90 BASE) MCG/ACT inhaler Inhale 2 puffs into the lungs every 6 (six) hours as needed for wheezing or shortness of breath.      Marland Kitchen. amLODipine (NORVASC) 10  MG tablet Take 10 mg by mouth daily.       Marland Kitchen. aspirin EC 325 MG EC tablet Take 1 tablet (325 mg total) by mouth daily.  30 tablet  0  . efavirenz-emtricitabine-tenofovir (ATRIPLA) 600-200-300 MG per tablet Take 1 tablet by mouth at bedtime.       . enalapril-hydrochlorothiazide (VASERETIC) 10-25 MG per tablet Take 1 tablet by mouth every morning.       Marland Kitchen. ibuprofen (ADVIL,MOTRIN) 200 MG tablet Take 400 mg by mouth every 6 (six) hours as needed for pain.      . metoprolol succinate (TOPROL-XL) 100 MG 24 hr tablet Take 100 mg by mouth daily. Take with or immediately following a meal.      . omeprazole (PRILOSEC) 20 MG capsule Take 1 capsule (20 mg total) by mouth daily.  15 capsule  0  . simvastatin (ZOCOR) 20 MG tablet Take 1 tablet (20 mg total) by mouth daily at 6 PM.  30 tablet  0   No current facility-administered medications for this visit.    LABS/IMAGING: No results found for this or any previous visit (from the past 48 hour(s)). No results found.  VITALS: BP 130/82  Pulse 93  Ht 5\' 10"  (1.778 m)  Wt 202 lb (91.627 kg)  BMI 28.98 kg/m2  EXAM: General appearance: alert and no distress Neck: no carotid bruit and no JVD Lungs: clear to auscultation bilaterally Heart: regular rate and rhythm, S1, S2 normal, no murmur, click, rub or gallop Abdomen: soft, non-tender; bowel sounds normal; no masses,  no organomegaly Extremities: extremities normal, atraumatic, no cyanosis or edema Pulses: 2+ and symmetric Skin: Skin color, texture, turgor normal. No rashes or lesions Neurologic: Grossly normal Psych: normal  EKG: Normal sinus rhythm, voltage criteria for LVH, inferior Q waves in 3 and aVF with T-wave inversions inferiorly and laterally  ASSESSMENT: 1. Exertional chest pain and dyspnea 2. Low normal EF of 50-55% with posterior basal wall motion abnormality 3. Abnormal EKG which is possibly ischemic 4. Long-standing hypertension 5. Dyslipidemia 6. Recent TIA x2 7. HIV disease  on appropriate medication, undetectable viral load  PLAN: 1.   Mr. Dickel has a number of cardiac risk factors including long-standing hypertension, dyslipidemia, HIV disease and use of antiviral medications. All of these increase his risk of coronary artery disease. Despite his young age his EKG is abnormal. A low normal EF could be related to hypertension, HIV disease or coronary artery disease. He is reasonably been describing chest pain with exertion and increasing shortness of breath which is worse over the past several weeks. He has had 2 TIA episodes. I'm concerned about coronary artery disease and I would recommend stress testing. He did not feel he can walk more than a couple blocks without getting short of breath and will likely not be able to participate to a target on the treadmill. I would therefore recommend a LexiScan nuclear stress test.  Plan for followup after stress test to discuss the results. Thank you for the kind referral.  [name], MD, [facility] Attending Cardiologist [facility]  HILTY,Kenneth C 05/09/2014, 4:24 PM

## 2014-05-09 NOTE — Patient Instructions (Addendum)
Atherosclerosis Atherosclerosis, or hardening of the arteries, is the buildup of plaque within the major arteries in the body. Plaque is made up of fats (lipids), cholesterol, calcium, and fibrous tissue. Plaque can narrow or block blood flow within an artery. Plaque can break off and cause damage to the affected organ. Plaque can also "rupture." When plaque ruptures within an artery, a clot can form, causing a sudden (acute) blockage of the artery. Untreated atherosclerosis can cause serious health problems or death.  RISK FACTORS  High cholesterol levels.  Smoking.  Obesity.  Lack of activity or exercise.  Eating a diet high in saturated fat.  Family history.  Diabetes. SIGNS AND SYMPTOMS  Symptoms of atherosclerosis can occur when blood flow to an artery is slowed or blocked. Severity and onset of symptoms depends on how extensive the narrowing or blockage is. A sudden plaque rupture can bring immediate, life-threatening symptoms. Atherosclerosis can affect different arteries in the body, for example:  Coronary arteries. The coronary arteries supply the heart with blood. When the coronary arteries are narrowed or blocked from atherosclerosis, this is known as coronary artery disease (CAD). CAD can cause a heart attack. Common heart attack symptoms include:  Chest pain or pain that radiates to the neck, arm, jaw, or in the upper, middle back (mid-scapular pain).  Shortness of breath without cause.  Profuse sweating while at rest.  Irregular heartbeats.  Nausea or gastrointestinal upset.  Carotid arteries. The carotid arteries supply the brain with blood. They are located on each side of your neck. When blood flow to these arteries is slowed or blocked, a transient ischemic attack (TIA) or stroke can occur. A TIA is considered a "mini-stroke" or "warning stroke." TIA symptoms are the same as stroke symptoms, but they are temporary and last less than 24 hours. A stroke can cause  permanent damage or death. Common TIA and stroke symptoms include:  Sudden numbness or weakness to one side of your body, such as the face, arm, or leg.  Sudden confusion or trouble speaking or understanding.  Sudden trouble seeing out of one or both eyes.  Sudden trouble walking, loss of balance, or dizziness.  Sudden, severe headache with no known cause.  Arteries in the legs. When arteries in the lower legs become narrowed or blocked, this is known as peripheral vascular disease (PVD). PVD can cause a symptom called claudication. Claudication is pain or a burning feeling in your legs when walking or exercising and usually goes away with rest. Very severe PVD can cause pain in your legs while at rest.  Renal arteries. The renal arteries supply the kidneys with blood. Blockage of the renal arteries can cause a decline in kidney function or high blood pressure (hypertension).  Gastrointestinal arteries (mesenteric circulation). Abdominal pain may occur after eating. DIAGNOSIS  Your health care provider may perform the following tests to diagnose atherosclerosis:  Blood tests.  Stress test.  Echocardiogram.  Nuclear scan.  Ankle/brachial index.  Ultrasonography.  Computed tomography (CT) scan.  Angiography. TREATMENT  Atherosclerosis treatment includes the following:  Lifestyle changes such as:  Quitting smoking. Your health care provider can help you with smoking cessation.  Eating a diet low in saturated fat. A registered dietitian can educate you on healthy food options, such as helping you understand the difference between good fat and bad fat.  Following an exercise program approved by your health care provider.  Maintaining a healthy weight. Lose weight as approved by your health care provider.  Have   your cholesterol levels checked as directed by your health care provider.  Medicines. Cholesterol medicines can help slow or stop the progression of  atherosclerosis.  Different procedural or surgical interventions to treat atherosclerosis include:  Balloon angioplasty. The technical name for balloon angioplasty is percutaneous transluminal angioplasty (PTA). In this procedure, a catheter with a small balloon at the tip is inserted through the blocked or narrowed artery. The balloon is then inflated. When the balloon is inflated, the fatty plaque is compressed against the artery wall, allowing better blood flow within the artery.  Balloon angioplasty and stenting. In this procedure, balloon angioplasty is combined with a stenting procedure. A stent is a small, metal mesh tube that keeps the artery open. After the artery is opened up by the balloon technique, the stent is then deployed. The stent is permanent.  Open heart surgery or bypass surgery. To perform this type of surgery, a healthy vessel is first "harvested" from either the leg or arm. The harvested vessel is then used to "bypass" the blocked atherosclerotic vessel so new blood flow can be established.  Atherectomy. Atherectomy is a procedure that uses a catheter with a sharp blade to remove plaque from an artery. A chamber in the catheter collects the plaque.  Endarterectomy. An endarterectomy is a surgical procedure where a surgeon removes plaque from an artery.  Amputation. When blockages in the lower legs are very severe and circulation cannot be restored, amputation may be required. SEEK IMMEDIATE MEDICAL CARE IF:  You are having heart attack symptoms, such as:  Chest pain or pain that radiates to the neck, arm, jaw, or in the upper, middle back (mid-scapular pain).  Shortness of breath without cause.  Profuse sweating while at rest.  Irregular heartbeats.  Nausea or gastrointestinal upset.  You are having stroke symptoms, such as sudden:  Numbness or weakness to one side of your body, such as the face, arm, or leg.  Confusion or trouble speaking or  understanding.  Trouble seeing out of one or both eyes.  Trouble walking, loss of balance, or dizziness.  Severe headache with no known cause.  Your hands or feet are bluish, cold, or you have pain in them.  You have bad abdominal pain after eating. Symptoms of heart attack or stroke may represent a serious problem that is an emergency. Do not wait to see if the symptoms will go away. Get medical help right away. Call your local emergency services (911 in the [place] Do not drive yourself to the hospital. Document Released: 11/26/2003 Document Revised: 01/20/2014 Document Reviewed: 11/08/2011 [facility] Patient Information 2015 [place], [place]. This information is not intended to replace advice given to you by your health care provider. Make sure you discuss any questions you have with your health care provider.   Your physician recommends that you schedule a follow-up appointment After testing  Your physician has requested that you have a lexiscan myoview. For further information please visit [email or web address] Please follow instruction sheet, as given.

## 2014-05-14 ENCOUNTER — Telehealth (HOSPITAL_COMMUNITY): Payer: Self-pay

## 2014-05-14 NOTE — Telephone Encounter (Signed)
Encounter complete. 

## 2014-05-15 ENCOUNTER — Telehealth (HOSPITAL_COMMUNITY): Payer: Self-pay

## 2014-05-15 NOTE — Telephone Encounter (Signed)
Encounter complete. 

## 2014-05-16 ENCOUNTER — Ambulatory Visit (HOSPITAL_COMMUNITY)
Admission: RE | Admit: 2014-05-16 | Discharge: 2014-05-16 | Disposition: A | Discharge status: Home / Self Care | Payer: BC Managed Care – PPO | Source: Ambulatory Visit | Attending: Cardiology | Admitting: Cardiology

## 2014-05-16 DIAGNOSIS — R9431 Abnormal electrocardiogram [ECG] [EKG]: Secondary | ICD-10-CM | POA: Diagnosis present

## 2014-05-16 DIAGNOSIS — R5383 Other fatigue: Secondary | ICD-10-CM | POA: Diagnosis not present

## 2014-05-16 DIAGNOSIS — Z8673 Personal history of transient ischemic attack (TIA), and cerebral infarction without residual deficits: Secondary | ICD-10-CM | POA: Diagnosis not present

## 2014-05-16 DIAGNOSIS — R002 Palpitations: Secondary | ICD-10-CM | POA: Insufficient documentation

## 2014-05-16 DIAGNOSIS — R079 Chest pain, unspecified: Secondary | ICD-10-CM | POA: Diagnosis not present

## 2014-05-16 DIAGNOSIS — I10 Essential (primary) hypertension: Secondary | ICD-10-CM | POA: Diagnosis not present

## 2014-05-16 DIAGNOSIS — R42 Dizziness and giddiness: Secondary | ICD-10-CM | POA: Insufficient documentation

## 2014-05-16 DIAGNOSIS — E785 Hyperlipidemia, unspecified: Secondary | ICD-10-CM | POA: Diagnosis not present

## 2014-05-16 DIAGNOSIS — B20 Human immunodeficiency virus [HIV] disease: Secondary | ICD-10-CM | POA: Insufficient documentation

## 2014-05-16 DIAGNOSIS — R0609 Other forms of dyspnea: Secondary | ICD-10-CM | POA: Insufficient documentation

## 2014-05-16 DIAGNOSIS — E663 Overweight: Secondary | ICD-10-CM | POA: Insufficient documentation

## 2014-05-16 DIAGNOSIS — Z87891 Personal history of nicotine dependence: Secondary | ICD-10-CM | POA: Diagnosis not present

## 2014-05-16 DIAGNOSIS — R0989 Other specified symptoms and signs involving the circulatory and respiratory systems: Secondary | ICD-10-CM | POA: Insufficient documentation

## 2014-05-16 DIAGNOSIS — R5381 Other malaise: Secondary | ICD-10-CM | POA: Insufficient documentation

## 2014-05-16 MED ORDER — REGADENOSON 0.4 MG/5ML IV SOLN
0.4000 mg | Freq: Once | INTRAVENOUS | Status: AC
  Administered 2014-05-16: 0.4 mg via INTRAVENOUS

## 2014-05-16 MED ORDER — TECHNETIUM TC 99M SESTAMIBI GENERIC - CARDIOLITE
31.4000 | Freq: Once | INTRAVENOUS | Status: AC | PRN
  Administered 2014-05-16: 31 via INTRAVENOUS

## 2014-05-16 MED ORDER — TECHNETIUM TC 99M SESTAMIBI GENERIC - CARDIOLITE
10.3000 | Freq: Once | INTRAVENOUS | Status: AC | PRN
  Administered 2014-05-16: 10 via INTRAVENOUS

## 2014-05-16 NOTE — Procedures (Addendum)
[facility] [address] [place] [address] [place] [address] [phone]  Cardiology Nuclear Med Study  Justin Cox is a 33 y.o. male     MRN : [id number]     DOB: 1980/09/22  Procedure Date: 05/16/2014  Nuclear Med Background Indication for Stress Test:  Evaluation for Ischemia, Post Hospital, Abnormal EKG and HIV History:  Asthma and No prior cardiac history reported;No prior NUC MPI for comparison;ECHO on-04/11/2014-EF=50-55% Cardiac Risk Factors: History of Smoking, Hypertension, Lipids, Overweight and TIA  Symptoms:  Chest Pain, Dizziness, DOE, Fatigue, Light-Headedness and Palpitations   Nuclear Pre-Procedure Caffeine/Decaff Intake:  1:00am NPO After: 11am   IV Site: R Forearm  IV 0.9% NS with Angio Cath:  22g  Chest Size (in):  46"  IV Started by: Berdie Ogren, RN  Height: 5\' 10"  (1.778 m)  Cup Size: n/a  BMI:  Body mass index is 28.98 kg/(m^2). Weight:  202 lb (91.627 kg)   Tech Comments:  n/a    Nuclear Med Study 1 or 2 day study: 1 day  Stress Test Type:  Lexiscan  Order Authorizing Provider:  Zoila Shutter, MD   Resting Radionuclide: Technetium 85m Sestamibi  Resting Radionuclide Dose: 10.3 mCi   Stress Radionuclide:  Technetium 58m Sestamibi  Stress Radionuclide Dose: 31.4 mCi           Stress Protocol Rest HR:70 Stress HR: 107  Rest BP:138/95 Stress BP: 138/95  Exercise Time (min): n/a METS: n/a          Dose of Adenosine (mg):  n/a Dose of Lexiscan: 0.4 mg  Dose of Atropine (mg): n/a Dose of Dobutamine: n/a mcg/kg/min (at max HR)  Stress Test Technologist: Ernestene Mention, CCT Nuclear Technologist: Gonzella Lex, CNMT   Rest Procedure:  Myocardial perfusion imaging was performed at rest 45 minutes following the intravenous administration of Technetium 30m Sestamibi. Stress Procedure:  The patient received IV Lexiscan 0.4 mg over 15-seconds.  Technetium 65m Sestamibi injected at 30-seconds.  There were no  significant changes with Lexiscan.  Quantitative spect images were obtained after a 45 minute delay.  Transient Ischemic Dilatation (Normal <1.22):  1.21  QGS EDV:  157 ml QGS ESV:  105 ml LV Ejection Fraction: 33%        Rest ECG: NSR with non-specific ST-T wave changes  Stress ECG: No significant change from baseline ECG  QPS Raw Data Images:  Normal; no motion artifact; normal heart/lung ratio. Stress Images:  Small, mild basal inferior defect Rest Images:  Normal homogeneous uptake in all areas of the myocardium. Subtraction (SDS):  Mild qualitative without quantitative basal inferior ischemia.  Impression Exercise Capacity:  Lexiscan with no exercise. BP Response:  Normal blood pressure response. Clinical Symptoms:  Mild shortness of breath. ECG Impression:  No significant ST segment change suggestive of ischemia. Comparison with Prior Nuclear Study: No previous nuclear study performed  Overall Impression:  Low risk stress nuclear study demonstrating a small region of qualitative without statistically significant mild basal inferior ischemia.  LV Wall Motion:  Moderate global hypokinesis with EF 33%   KELLY,THOMAS A, MD  05/16/2014 5:50 PM

## 2014-05-30 ENCOUNTER — Ambulatory Visit (INDEPENDENT_AMBULATORY_CARE_PROVIDER_SITE_OTHER): Payer: BC Managed Care – PPO | Admitting: Neurology

## 2014-05-30 ENCOUNTER — Encounter (INDEPENDENT_AMBULATORY_CARE_PROVIDER_SITE_OTHER): Payer: Self-pay

## 2014-05-30 ENCOUNTER — Encounter: Payer: Self-pay | Admitting: Neurology

## 2014-05-30 VITALS — BP 134/88 | HR 90 | Ht 71.0 in | Wt 198.4 lb

## 2014-05-30 DIAGNOSIS — G459 Transient cerebral ischemic attack, unspecified: Secondary | ICD-10-CM

## 2014-05-30 DIAGNOSIS — I429 Cardiomyopathy, unspecified: Secondary | ICD-10-CM

## 2014-05-30 DIAGNOSIS — I10 Essential (primary) hypertension: Secondary | ICD-10-CM

## 2014-05-30 DIAGNOSIS — E785 Hyperlipidemia, unspecified: Secondary | ICD-10-CM | POA: Insufficient documentation

## 2014-05-30 DIAGNOSIS — I428 Other cardiomyopathies: Secondary | ICD-10-CM | POA: Insufficient documentation

## 2014-05-30 NOTE — Progress Notes (Signed)
STROKE NEUROLOGY FOLLOW UP NOTE  NAME: Justin Cox DOB: 10-Nov-1980  REASON FOR VISIT: stroke follow up HISTORY FROM: pt and chart  Today we had the pleasure of seeing Justin Cox in follow-up at our Neurology Clinic. Pt was accompanied by no one.   History Summary 33 y.o. male history of uncontrolled HTN, HLD, asthma and HIV infection presenting with acute onset 2 episodes of numbness and weakness involving his left arm and leg as well as slurry speech, resolved in about 2-3 hours. CT scan of his head showed no acute intracranial abnormality. MRI negative for acute stroke. And negative CTA head and neck. Blood pressure was noted to be elevated on arrival 189/109.   He has been having HTN for 10 years but only recently follow up at outpt working up for the etiology of HTN. His HTN was not controlled in the past.  He was also found low normal EF at 50% on 2D echo and he complains of daily SOB.  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 He had hx of syphilis and got treated in health department and he follows with Dr. Lawana Pai in [facility] for HIV.   During admission, his BP got better control with multiple anti-HTN meds and discharged home with ASA 325 and zocor for stroke prevention. And he was to follow up in clinic with cardiology, neurology, ID and PCP.  Interval History During the interval time, the patient has been doing fine. He stated that he only had one episode of left arm feeling cold for an hour last week. No other neurological complaint.   He checked his BP at home daily, usually runs 125/80. Today in clinic is 134/88. He is still on norvasc, enalapril, HCTZ and metoprolol.   He had cardiology follow up and did stress test, which showed a small region of qualitative without statistically significant mild basal inferior ischemia and LVEF 33%. He still complains of SOB and he is going to see Dr. Rennis Golden again on 9/28.  He has not followed up with his ID  doctor and PCP yet. He stated that he will see them in Oct and Nov, respectively.    REVIEW OF SYSTEMS: Full 14 system review of systems performed and notable only for those listed below and in HPI above, all others are negative:  Constitutional: fatigue  Cardiovascular: N/A  Ear/Nose/Throat: trouble swallowing Skin: N/A  Eyes: N/A  Respiratory: SOB  Gastroitestinal: N/A  Genitourinary: N/A Hematology/Lymphatic: N/A  Endocrine: feeling hot  Musculoskeletal: N/A  Allergy/Immunology: N/A  Neurological: headache, difficulty swallowing  Psychiatric: N/A  The following represents the patient's updated allergies and side effects list: No Known Allergies  Labs since last visit of relevance include the following: Results for orders placed during the hospital encounter of 04/10/14  ETHANOL      Result Value Ref Range   Alcohol, Ethyl (B) <11  0 - 11 mg/dL  PROTIME-INR      Result Value Ref Range   Prothrombin Time 13.1  11.6 - 15.2 seconds   INR 0.99  0.00 - 1.49  APTT      Result Value Ref Range   aPTT 28  24 - 37 seconds  CBC      Result Value Ref Range   WBC 5.3  4.0 - 10.5 K/uL   RBC 4.65  4.22 - 5.81 MIL/uL   Hemoglobin 16.1  13.0 - 17.0 g/dL   HCT 97.6  73.4 - 19.3 %  MCV 97.8  78.0 - 100.0 fL   MCH 34.6 (*) 26.0 - 34.0 pg   MCHC 35.4  30.0 - 36.0 g/dL   RDW 16.1  09.6 - 04.5 %   Platelets 140 (*) 150 - 400 K/uL  DIFFERENTIAL      Result Value Ref Range   Neutrophils Relative % 48  43 - 77 %   Neutro Abs 2.6  1.7 - 7.7 K/uL   Lymphocytes Relative 39  12 - 46 %   Lymphs Abs 2.1  0.7 - 4.0 K/uL   Monocytes Relative 11  3 - 12 %   Monocytes Absolute 0.6  0.1 - 1.0 K/uL   Eosinophils Relative 1  0 - 5 %   Eosinophils Absolute 0.1  0.0 - 0.7 K/uL   Basophils Relative 1  0 - 1 %   Basophils Absolute 0.0  0.0 - 0.1 K/uL  COMPREHENSIVE METABOLIC PANEL      Result Value Ref Range   Sodium 139  137 - 147 mEq/L   Potassium 4.0  3.7 - 5.3 mEq/L   Chloride 102  96 - 112  mEq/L   CO2 25  19 - 32 mEq/L   Glucose, Bld 108 (*) 70 - 99 mg/dL   BUN 8  6 - 23 mg/dL   Creatinine, Ser 4.09  0.50 - 1.35 mg/dL   Calcium 9.7  8.4 - 81.1 mg/dL   Total Protein 8.6 (*) 6.0 - 8.3 g/dL   Albumin 4.0  3.5 - 5.2 g/dL   AST 30  0 - 37 U/L   ALT 43  0 - 53 U/L   Alkaline Phosphatase 59  39 - 117 U/L   Total Bilirubin 0.2 (*) 0.3 - 1.2 mg/dL   GFR calc non Af Amer >90  >90 mL/min   GFR calc Af Amer >90  >90 mL/min   Anion gap 12  5 - 15  URINE RAPID DRUG SCREEN (HOSP PERFORMED)      Result Value Ref Range   Opiates NONE DETECTED  NONE DETECTED   Cocaine NONE DETECTED  NONE DETECTED   Benzodiazepines NONE DETECTED  NONE DETECTED   Amphetamines NONE DETECTED  NONE DETECTED   Tetrahydrocannabinol NONE DETECTED  NONE DETECTED   Barbiturates NONE DETECTED  NONE DETECTED  URINALYSIS, ROUTINE W REFLEX MICROSCOPIC      Result Value Ref Range   Color, Urine YELLOW  YELLOW   APPearance CLEAR  CLEAR   Specific Gravity, Urine 1.020  1.005 - 1.030   pH 6.0  5.0 - 8.0   Glucose, UA NEGATIVE  NEGATIVE mg/dL   Hgb urine dipstick TRACE (*) NEGATIVE   Bilirubin Urine NEGATIVE  NEGATIVE   Ketones, ur NEGATIVE  NEGATIVE mg/dL   Protein, ur NEGATIVE  NEGATIVE mg/dL   Urobilinogen, UA 0.2  0.0 - 1.0 mg/dL   Nitrite NEGATIVE  NEGATIVE   Leukocytes, UA NEGATIVE  NEGATIVE  TROPONIN I      Result Value Ref Range   Troponin I <0.30  <0.30 ng/mL  URINE MICROSCOPIC-ADD ON      Result Value Ref Range   RBC / HPF 3-6  <3 RBC/hpf  COMPREHENSIVE METABOLIC PANEL      Result Value Ref Range   Sodium 139  137 - 147 mEq/L   Potassium 3.8  3.7 - 5.3 mEq/L   Chloride 102  96 - 112 mEq/L   CO2 23  19 - 32 mEq/L   Glucose, Bld 114 (*) 70 -  99 mg/dL   BUN 8  6 - 23 mg/dL   Creatinine, Ser 1.61  0.50 - 1.35 mg/dL   Calcium 9.2  8.4 - 09.6 mg/dL   Total Protein 8.2  6.0 - 8.3 g/dL   Albumin 3.8  3.5 - 5.2 g/dL   AST 25  0 - 37 U/L   ALT 40  0 - 53 U/L   Alkaline Phosphatase 56  39 - 117 U/L     Total Bilirubin 0.4  0.3 - 1.2 mg/dL   GFR calc non Af Amer >90  >90 mL/min   GFR calc Af Amer >90  >90 mL/min   Anion gap 14  5 - 15  CBC      Result Value Ref Range   WBC 7.0  4.0 - 10.5 K/uL   RBC 4.47  4.22 - 5.81 MIL/uL   Hemoglobin 15.6  13.0 - 17.0 g/dL   HCT 04.5  40.9 - 81.1 %   MCV 99.1  78.0 - 100.0 fL   MCH 34.9 (*) 26.0 - 34.0 pg   MCHC 35.2  30.0 - 36.0 g/dL   RDW 91.4  78.2 - 95.6 %   Platelets 147 (*) 150 - 400 K/uL  LIPID PANEL      Result Value Ref Range   Cholesterol 220 (*) 0 - 200 mg/dL   Triglycerides 213 (*) <150 mg/dL   HDL 42  >08 mg/dL   Total CHOL/HDL Ratio 5.2     VLDL 57 (*) 0 - 40 mg/dL   LDL Cholesterol 657 (*) 0 - 99 mg/dL  HEMOGLOBIN Q4O      Result Value Ref Range   Hemoglobin A1C 5.6  <5.7 %   Mean Plasma Glucose 114  <117 mg/dL  TSH      Result Value Ref Range   TSH 0.649  0.350 - 4.500 uIU/mL  T4, FREE      Result Value Ref Range   Free T4 1.11  0.80 - 1.80 ng/dL  VITAMIN N62      Result Value Ref Range   Vitamin B-12 336  211 - 911 pg/mL  FOLATE      Result Value Ref Range   Folate 7.6    RPR      Result Value Ref Range   RPR Reactive (*) NON REAC  SEDIMENTATION RATE      Result Value Ref Range   Sed Rate 8  0 - 16 mm/hr  HIGH SENSITIVITY CRP      Result Value Ref Range   CRP, High Sensitivity 5.5 (*)   RPR TITER      Result Value Ref Range   RPR Titer 1:4 (*) NON REACTIVE  FLUORESCENT TREPONEMAL AB(FTA)-IGG-BLD      Result Value Ref Range   Fluorescent Treponemal AB, IgG Reactive (*)   I-STAT CHEM 8, ED      Result Value Ref Range   Sodium 140  137 - 147 mEq/L   Potassium 3.8  3.7 - 5.3 mEq/L   Chloride 104  96 - 112 mEq/L   BUN 7  6 - 23 mg/dL   Creatinine, Ser 9.52  0.50 - 1.35 mg/dL   Glucose, Bld 841 (*) 70 - 99 mg/dL   Calcium, Ion 3.24  4.01 - 1.23 mmol/L   TCO2 25  0 - 100 mmol/L   Hemoglobin 17.0  13.0 - 17.0 g/dL   HCT 02.7  25.3 - 66.4 %    The neurologically relevant items  on the patient's problem  list were reviewed on today's visit.  Neurologic Examination  A problem focused neurological exam (12 or more points of the single system neurologic examination, vital signs counts as 1 point, cranial nerves count for 8 points) was performed.  Blood pressure 134/88, pulse 90, height  (1.803 m), weight 198 lb 6.4 oz (89.994 kg).  General - Well nourished, well developed, in no apparent distress.  Ophthalmologic - Sharp disc margins OU.  Cardiovascular - Regular rate and rhythm with no murmur.  Mental Status -  Level of arousal and orientation to time, place, and person were intact. Language including expression, naming, repetition, comprehension was assessed and found intact. Attention span and concentration were normal. Fund of Knowledge was assessed and was intact.  Cranial Nerves II - XII - II - Visual field intact OU. III, IV, VI - Extraocular movements intact. V - Facial sensation intact bilaterally. VII - Facial movement intact bilaterally. VIII - Hearing & vestibular intact bilaterally. X - Palate elevates symmetrically. XI - Chin turning & shoulder shrug intact bilaterally. XII - Tongue protrusion intact.  Motor Strength - The patient's strength was normal in all extremities and pronator drift was absent.  Bulk was normal and fasciculations were absent.   Motor Tone - Muscle tone was assessed at the neck and appendages and was normal.  Reflexes - The patient's reflexes were normal in all extremities and he had no pathological reflexes.  Sensory - Light touch, temperature/pinprick and Romberg testing were assessed and were normal.    Coordination - The patient had normal movements in the hands and feet with no ataxia or dysmetria.  Tremor was absent.  Gait and Station - The patient's transfers, posture, gait, station, and turns were observed as normal.  Data reviewed: I personally reviewed the images and agree with the radiology interpretations.  Ct Angio Head  W/cm &/or Wo Cm  04/11/2014  Negative CTA head and neck.  Ct Head Wo Contrast  04/10/2014  Negative CT head.  Ct Angio Neck W/cm &/or Wo/cm  04/11/2014  Negative CTA head and neck.  Mr Brain Wo Contrast  04/10/2014  1. Normal MRI appearance of the brain.  2. Mild to moderate paranasal sinus inflammatory changes. Physiologic appearing adenoid and tonsillar hypertrophy.  2D Echocardiogram - Left ventricle: The cavity size was normal. Wall thickness was increased in a pattern of moderate LVH. Systolic function was normal. The estimated ejection fraction was in the range of 50% to 55%. Possible hypokinesis of the basal posterior myocardium. Doppler parameters are consistent with abnormal left ventricular relaxation (grade 1 diastolic dysfunction). Impressions: - Low normal LV function; possible hypokinesis of the basal posterior myocardium; no significant valvular abnormality.  Stress test 05/16/14 Exercise Capacity: Lexiscan with no exercise. BP Response: Normal blood pressure response. Clinical Symptoms: Mild shortness of breath. ECG Impression: No significant ST segment change suggestive of  ischemia. Comparison with Prior Nuclear Study: No previous nuclear study  performed Overall Impression: Low risk stress nuclear study demonstrating  a small region of qualitative without statistically significant  mild basal inferior ischemia. LV Wall Motion: Moderate global hypokinesis with EF 33%  EKG - Sinus tachycardia rate 100 beats per minute. LVH by voltage. Borderline T abnormalities, inferior leads. Anterior ST elevation, probably due to LVH. Since last tracing rate faster (29 Mar 2006) T wave inversion now evident in Lateral leads.  Component     Latest Ref Rng 04/11/2014  Cholesterol     0 - 200  mg/dL 161 (H)  Triglycerides     <150 mg/dL 096 (H)  HDL     >04 mg/dL 42  Total CHOL/HDL Ratio      5.2  VLDL     0 - 40 mg/dL 57 (H)  LDL (calc)     0 - 99 mg/dL 540 (H)    Hemoglobin J8J     <5.7 % 5.6  Mean Plasma Glucose     <117 mg/dL 191  TSH     4.782 - 9.562 uIU/mL 0.649  Free T4     0.80 - 1.80 ng/dL 1.30  Vitamin Q-65     211 - 911 pg/mL 336  Folate      7.6  RPR     NON REAC Reactive (A)  Sed Rate     0 - 16 mm/hr 8  CRP, High Sensitivity      5.5 (H)  RPR Titer     NON REACTIVE 1:4 (A)  Fluorescent Treponemal AB, IgG      Reactive (A)   Assessment: As you may recall, he is a 33 y.o. African American male with PMH of uncontrolled HTN, HLD, asthma and HIV infection presenting with acute onset 2 episodes of numbness and weakness involving his left arm and leg as well as slurry speech, resolved in about 2-3 hours. He has risk factor for stroke with HTN, HLD, HIV, low EF. He is on ASA and zocor for stroke prevention. HTN seems controlled well on 4 meds. He has not followed up with PCP yet for BP etiology work up. He has RPR 1:4 and previously treated for syphilis so neurosyphilis is low in DDx but would like him to follow up with his ID doctor for further follow up. His recent stress test showed EF 33%, he needs to follow up closely with cardiology.  Plan:  - continue ASA  and zocor for stroke prevention - continue meds for Bp control and check Bp at home daily - follow up with Dr. Rennis Golden for low EF  - follow up with Dr. Lawana Pai in [facility] for HIV and rule out neurosyphilis - follow up with PCP for HTN etiology work up and stroke risk factor modification - RTC in 3 months.  No orders of the defined types were placed in this encounter.    Patient Instructions  - continue take ASA  and zocor for stroke prevetion - continue to follow up with Dr. Rennis Golden in cardiology for low EF on stress test in 06/16/14 - follow up with Dr. Lawana Pai in [facility] in October - check BP at home everyday - continue BP meds and other medication. Compliant with meds - continue follow up with PCP in November for stroke risk factor modification - follow up in 3  months.   Marvel Plan, MD PhD [facility] [address], [address] [place], [place] [address] [phone]

## 2014-05-30 NOTE — Patient Instructions (Addendum)
-   continue take ASA 325mg  and zocor for stroke prevetion - continue to follow up with Dr. Rennis Golden in cardiology for low EF on stress test in 06/16/14 - follow up with Dr. Lawana Pai in [facility] in October - check BP at home everyday - continue BP meds and other medication. Compliant with meds - continue follow up with PCP in November for stroke risk factor modification - follow up in 3 months.

## 2014-06-16 ENCOUNTER — Ambulatory Visit: Payer: BC Managed Care – PPO | Admitting: Cardiovascular Disease

## 2014-06-16 ENCOUNTER — Encounter: Payer: Self-pay | Admitting: Internal Medicine

## 2014-06-16 ENCOUNTER — Ambulatory Visit (INDEPENDENT_AMBULATORY_CARE_PROVIDER_SITE_OTHER): Payer: BC Managed Care – PPO | Admitting: Internal Medicine

## 2014-06-16 VITALS — BP 136/85 | HR 87 | Ht 71.0 in | Wt 205.5 lb

## 2014-06-16 DIAGNOSIS — Z21 Asymptomatic human immunodeficiency virus [HIV] infection status: Secondary | ICD-10-CM

## 2014-06-16 DIAGNOSIS — R5383 Other fatigue: Secondary | ICD-10-CM

## 2014-06-16 DIAGNOSIS — B20 Human immunodeficiency virus [HIV] disease: Secondary | ICD-10-CM

## 2014-06-16 DIAGNOSIS — R9389 Abnormal findings on diagnostic imaging of other specified body structures: Secondary | ICD-10-CM

## 2014-06-16 DIAGNOSIS — R9439 Abnormal result of other cardiovascular function study: Secondary | ICD-10-CM

## 2014-06-16 DIAGNOSIS — R931 Abnormal findings on diagnostic imaging of heart and coronary circulation: Secondary | ICD-10-CM

## 2014-06-16 DIAGNOSIS — E785 Hyperlipidemia, unspecified: Secondary | ICD-10-CM

## 2014-06-16 DIAGNOSIS — I10 Essential (primary) hypertension: Secondary | ICD-10-CM

## 2014-06-16 DIAGNOSIS — R9431 Abnormal electrocardiogram [ECG] [EKG]: Secondary | ICD-10-CM

## 2014-06-16 DIAGNOSIS — D689 Coagulation defect, unspecified: Secondary | ICD-10-CM

## 2014-06-16 DIAGNOSIS — R5381 Other malaise: Secondary | ICD-10-CM

## 2014-06-16 DIAGNOSIS — R0789 Other chest pain: Secondary | ICD-10-CM

## 2014-06-16 DIAGNOSIS — Z01818 Encounter for other preprocedural examination: Secondary | ICD-10-CM

## 2014-06-16 NOTE — Patient Instructions (Signed)
Your physician has requested that you have a cardiac catheterization with Dr. Hilty. Cardiac catheterization is used to diagnose and/or treat various heart conditions. Doctors may recommend this procedure for a number of different reasons. The most common reason is to evaluate chest pain. Chest pain can be a symptom of coronary artery disease (CAD), and cardiac catheterization can show whether plaque is narrowing or blocking your heart's arteries. This procedure is also used to evaluate the valves, as well as measure the blood flow and oxygen levels in different parts of your heart. For further information please visit [email or web address]. Please follow instruction sheet, as given.  You will need to have blood work & a chest x-ray 3-5 days prior to this procedure.  Please go to [address] [facility] [facility] You do not need an appointment  

## 2014-06-16 NOTE — Progress Notes (Signed)
OFFICE NOTE  Chief Complaint:  Chest pain, DOE, recent abnormal echo  Primary Care Physician: Georgianne Fick, MD  HPI:  Justin Cox is a pleasant 33 year old male with past medical history 7 for long-standing hypertension, dyslipidemia, 2 recent TIAs, and HIV disease on medication with an undetectable viral load. He recently has been in the hospital for a second TIA. He had medication adjustments. An echocardiogram was performed which demonstrated a low normal EF of 50-55% with possible posterior basal wall motion abnormality. He is described increased frequency of chest pressure and shortness of breath with exertion over the past several weeks. 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 He does not exercise and is fairly sedentary when he is not working. There is no significant family history of heart disease however hypertension is somewhat rampant in the family.  Justin Cox returns today for followup. He underwent a nuclear stress test which showed a reduced EF of 33% with global hypokinesis and some mild reversible ischemia although was read as low risk. The EF does contradict the findings by echocardiography, suggestive of possible multivessel coronary disease. He continues to have episodes of chest discomfort more associated with exertion.  PMHx:  Past Medical History  Diagnosis Date  . Hypertension   . High cholesterol   . HIV infection   . Asthma     History reviewed. No pertinent past surgical history.  FAMHx:  Family History  Problem Relation Age of Onset  . Hypertension Other   . Hyperlipidemia Other   . Diabetes Other   . Hypertension Mother   . Diabetes Father   . Hyperlipidemia Father   . Hypertension Father     SOCHx:   reports that he quit smoking about 3 years ago. He does not have any smokeless tobacco history on file. He reports that he drinks about 1.5 ounces of alcohol per week. He reports that he does not use illicit  drugs.  ALLERGIES:  No Known Allergies  ROS: A comprehensive review of systems was negative except for: Respiratory: positive for dyspnea on exertion Cardiovascular: positive for chest pressure/discomfort  HOME MEDS: Current Outpatient Prescriptions  Medication Sig Dispense Refill  . albuterol (PROVENTIL HFA;VENTOLIN HFA) 108 (90 BASE) MCG/ACT inhaler Inhale 2 puffs into the lungs every 6 (six) hours as needed for wheezing or shortness of breath.      Marland Kitchen amLODipine (NORVASC) 10 MG tablet Take 10 mg by mouth daily.       Marland Kitchen amoxicillin-clavulanate (AUGMENTIN) 875-125 MG per tablet       . aspirin EC 325 MG EC tablet Take 1 tablet (325 mg total) by mouth daily.  30 tablet  0  . efavirenz-emtricitabine-tenofovir (ATRIPLA) 600-200-300 MG per tablet Take 1 tablet by mouth at bedtime.       . enalapril-hydrochlorothiazide (VASERETIC) 10-25 MG per tablet Take 1 tablet by mouth every morning.       Marland Kitchen guanFACINE (TENEX) 1 MG tablet Take 1 mg by mouth daily.      Marland Kitchen ibuprofen (ADVIL,MOTRIN) 200 MG tablet Take 400 mg by mouth every 6 (six) hours as needed for pain.      . metoprolol succinate (TOPROL-XL) 100 MG 24 hr tablet Take 100 mg by mouth daily. Take with or immediately following a meal.      . omeprazole (PRILOSEC) 20 MG capsule Take 1 capsule (20 mg total) by mouth daily.  15 capsule  0  . simvastatin (ZOCOR) 20 MG tablet Take 1  tablet (20 mg total) by mouth daily at 6 PM.  30 tablet  0   No current facility-administered medications for this visit.    LABS/IMAGING: No results found for this or any previous visit (from the past 48 hour(s)). No results found.  VITALS: BP 136/85  Pulse 87  Ht  (1.803 m)  Wt 205 lb 8 oz (93.214 kg)  BMI 28.67 kg/m2  EXAM: deferred  EKG: deferred  ASSESSMENT: 1. Exertional chest pain and dyspnea 2. Low normal EF of 50-55% with posterior basal wall motion abnormality 3. Abnormal EKG which is possibly ischemic 4. Long-standing  hypertension 5. Dyslipidemia 6. Recent TIA x2 7. HIV disease on appropriate medication, undetectable viral load  PLAN: 1.   Justin Cox had an abnormal nuclear stress test EF 33% and mild reversibility although read as low risk. The echocardiogram showed a low normal EF of 50-55% with posterior basal wall motion abnormality. I'm concerned about the differences between the studies as well as abnormality of wall motion on the echo. He has numerous risk factors for coronary disease and continues to have chest pain symptoms. We discussed the most definitive way to make a diagnosis and that would be cardiac catheterization. We discussed risks and benefits of heart catheterization and he agrees to proceed. We'll try to schedule that within the next couple of weeks.  [name], MD, [facility] Attending Cardiologist [facility]  Kynsley Whitehouse C 06/16/2014, 1:34 PM

## 2014-06-20 ENCOUNTER — Other Ambulatory Visit: Payer: Self-pay

## 2014-06-20 DIAGNOSIS — R9439 Abnormal result of other cardiovascular function study: Secondary | ICD-10-CM

## 2014-06-27 LAB — CBC
HCT: 43.2 % (ref 39.0–52.0)
HEMOGLOBIN: 15.6 g/dL (ref 13.0–17.0)
MCH: 34.7 pg — AB (ref 26.0–34.0)
MCHC: 36.1 g/dL — ABNORMAL HIGH (ref 30.0–36.0)
MCV: 96.2 fL (ref 78.0–100.0)
PLATELETS: 188 10*3/uL (ref 150–400)
RBC: 4.49 MIL/uL (ref 4.22–5.81)
RDW: 13.1 % (ref 11.5–15.5)
WBC: 4.6 10*3/uL (ref 4.0–10.5)

## 2014-06-28 LAB — BASIC METABOLIC PANEL
BUN: 9 mg/dL (ref 6–23)
CALCIUM: 9.5 mg/dL (ref 8.4–10.5)
CO2: 23 meq/L (ref 19–32)
Chloride: 101 mEq/L (ref 96–112)
Creat: 0.92 mg/dL (ref 0.50–1.35)
Glucose, Bld: 120 mg/dL — ABNORMAL HIGH (ref 70–99)
Potassium: 3.5 mEq/L (ref 3.5–5.3)
SODIUM: 139 meq/L (ref 135–145)

## 2014-06-28 LAB — PROTIME-INR
INR: 0.99 (ref <1.50)
Prothrombin Time: 13.1 seconds (ref 11.6–15.2)

## 2014-06-28 LAB — APTT: aPTT: 31 seconds (ref 24–37)

## 2014-06-28 LAB — TSH: TSH: 0.755 u[IU]/mL (ref 0.350–4.500)

## 2014-06-30 ENCOUNTER — Ambulatory Visit (HOSPITAL_COMMUNITY)
Admission: RE | Admit: 2014-06-30 | Discharge: 2014-06-30 | Disposition: A | Discharge status: Home / Self Care | Payer: BC Managed Care – PPO | Source: Ambulatory Visit | Attending: Internal Medicine | Admitting: Internal Medicine

## 2014-06-30 ENCOUNTER — Encounter (HOSPITAL_COMMUNITY)
Admission: RE | Disposition: A | Discharge status: Home / Self Care | Payer: Self-pay | Source: Ambulatory Visit | Attending: Internal Medicine

## 2014-06-30 ENCOUNTER — Encounter (HOSPITAL_COMMUNITY): Payer: Self-pay | Admitting: Internal Medicine

## 2014-06-30 DIAGNOSIS — E785 Hyperlipidemia, unspecified: Secondary | ICD-10-CM | POA: Diagnosis not present

## 2014-06-30 DIAGNOSIS — R9439 Abnormal result of other cardiovascular function study: Secondary | ICD-10-CM

## 2014-06-30 DIAGNOSIS — B20 Human immunodeficiency virus [HIV] disease: Secondary | ICD-10-CM | POA: Diagnosis not present

## 2014-06-30 DIAGNOSIS — R9431 Abnormal electrocardiogram [ECG] [EKG]: Secondary | ICD-10-CM | POA: Diagnosis present

## 2014-06-30 DIAGNOSIS — Z8673 Personal history of transient ischemic attack (TIA), and cerebral infarction without residual deficits: Secondary | ICD-10-CM | POA: Diagnosis not present

## 2014-06-30 DIAGNOSIS — I428 Other cardiomyopathies: Secondary | ICD-10-CM | POA: Diagnosis not present

## 2014-06-30 DIAGNOSIS — R931 Abnormal findings on diagnostic imaging of heart and coronary circulation: Secondary | ICD-10-CM

## 2014-06-30 DIAGNOSIS — Z7982 Long term (current) use of aspirin: Secondary | ICD-10-CM | POA: Insufficient documentation

## 2014-06-30 DIAGNOSIS — Z87891 Personal history of nicotine dependence: Secondary | ICD-10-CM | POA: Diagnosis not present

## 2014-06-30 DIAGNOSIS — R079 Chest pain, unspecified: Secondary | ICD-10-CM

## 2014-06-30 DIAGNOSIS — I10 Essential (primary) hypertension: Secondary | ICD-10-CM | POA: Insufficient documentation

## 2014-06-30 DIAGNOSIS — R0609 Other forms of dyspnea: Secondary | ICD-10-CM

## 2014-06-30 HISTORY — PX: LEFT HEART CATHETERIZATION WITH CORONARY ANGIOGRAM: SHX5451

## 2014-06-30 SURGERY — LEFT HEART CATHETERIZATION WITH CORONARY ANGIOGRAM
Anesthesia: LOCAL

## 2014-06-30 MED ORDER — ASPIRIN 81 MG PO CHEW: 81.0000 mg | CHEWABLE_TABLET | ORAL | Status: DC

## 2014-06-30 MED ORDER — LIDOCAINE HCL (PF) 1 % IJ SOLN
INTRAMUSCULAR | Status: AC
  Filled 2014-06-30: qty 30

## 2014-06-30 MED ORDER — VERAPAMIL HCL 2.5 MG/ML IV SOLN
INTRAVENOUS | Status: AC
  Filled 2014-06-30: qty 2

## 2014-06-30 MED ORDER — SODIUM CHLORIDE 0.9 % IV SOLN: 1.0000 mL/kg/h | INTRAVENOUS | Status: AC

## 2014-06-30 MED ORDER — ACETAMINOPHEN 325 MG PO TABS: 650.0000 mg | ORAL_TABLET | ORAL | Status: DC | PRN

## 2014-06-30 MED ORDER — FUROSEMIDE 20 MG PO TABS: 20.0000 mg | ORAL_TABLET | Freq: Every day | ORAL | Status: DC

## 2014-06-30 MED ORDER — HEPARIN (PORCINE) IN NACL 2-0.9 UNIT/ML-% IJ SOLN
INTRAMUSCULAR | Status: AC
  Filled 2014-06-30: qty 1000

## 2014-06-30 MED ORDER — FENTANYL CITRATE 0.05 MG/ML IJ SOLN
INTRAMUSCULAR | Status: AC
  Filled 2014-06-30: qty 2

## 2014-06-30 MED ORDER — MIDAZOLAM HCL 2 MG/2ML IJ SOLN
INTRAMUSCULAR | Status: AC
  Filled 2014-06-30: qty 2

## 2014-06-30 MED ORDER — NITROGLYCERIN 1 MG/10 ML FOR IR/CATH LAB
INTRA_ARTERIAL | Status: AC
  Filled 2014-06-30: qty 10

## 2014-06-30 MED ORDER — ONDANSETRON HCL 4 MG/2ML IJ SOLN: 4.0000 mg | Freq: Four times a day (QID) | INTRAMUSCULAR | Status: DC | PRN

## 2014-06-30 MED ORDER — SODIUM CHLORIDE 0.9 % IV SOLN
INTRAVENOUS | Status: DC
  Administered 2014-06-30: 06:00:00 via INTRAVENOUS

## 2014-06-30 MED ORDER — SODIUM CHLORIDE 0.9 % IJ SOLN: 3.0000 mL | INTRAMUSCULAR | Status: DC | PRN

## 2014-06-30 NOTE — Discharge Instructions (Signed)
Coronary Angiogram A coronary angiogram, also called coronary angiography, is an X-ray procedure used to look at the arteries in the heart. In this procedure, a dye (contrast dye) is injected through a long, hollow tube (catheter). The catheter is about the size of a piece of cooked spaghetti and is inserted through your groin, wrist, or arm. The dye is injected into each artery, and X-rays are then taken to show if there is a blockage in the arteries of your heart. LET [facility] CARE PROVIDER KNOW ABOUT:  Any allergies you have, including allergies to shellfish or contrast dye.   All medicines you are taking, including vitamins, herbs, eye drops, creams, and over-the-counter medicines.   Previous problems you or members of your family have had with the use of anesthetics.   Any blood disorders you have.   Previous surgeries you have had.  History of kidney problems or failure.   Other medical conditions you have. RISKS AND COMPLICATIONS  Generally, a coronary angiogram is a safe procedure. However, problems can occur and include:  Allergic reaction to the dye.  Bleeding from the access site or other locations.  Kidney injury, especially in people with impaired kidney function.  Stroke (rare).  Heart attack (rare). BEFORE THE PROCEDURE   Do not eat or drink anything after midnight the night before the procedure or as directed by your health care provider.   Ask your health care provider about changing or stopping your regular medicines. This is especially important if you are taking diabetes medicines or blood thinners. PROCEDURE  You may be given a medicine to help you relax (sedative) before the procedure. This medicine is given through an intravenous (IV) access tube that is inserted into one of your veins.   The area where the catheter will be inserted will be washed and shaved. This is usually done in the groin but may be done in the fold of your arm (near your  elbow) or in the wrist.   A medicine will be given to numb the area where the catheter will be inserted (local anesthetic).   The health care provider will insert the catheter into an artery. The catheter will be guided by using a special type of X-ray (fluoroscopy) of the blood vessel being examined.   A special dye will then be injected into the catheter, and X-rays will be taken. The dye will help to show where any narrowing or blockages are located in the heart arteries.  AFTER THE PROCEDURE   If the procedure is done through the leg, you will be kept in bed lying flat for several hours. You will be instructed to not bend or cross your legs.  The insertion site will be checked frequently.   The pulse in your feet or wrist will be checked frequently.   Additional blood tests, X-rays, and an electrocardiogram may be done.  Document Released: 03/12/2003 Document Revised: 01/20/2014 Document Reviewed: 01/28/2013 [facility] Patient Information 2015 [place], [place]. This information is not intended to replace advice given to you by your health care provider. Make sure you discuss any questions you have with your health care provider.    Excuse from Work, [employer], or Physical Activity ____[name]______________________________________________ needs to be excused from: __X___ Work _____ [employer] _____ Physical activity Beginning now and through the following date: __10/18/2015__________________ _____ He/she may return to work or school but still avoid physical activity from now until: ____________________ __X___ He/she may return to full physical activity as of: __10/19/2015_________________ [employer]  signature: [name]_Kenneth C. Hilty, MD_______________________________________  Date: _10/12/2015_____________________________________________________ Document Released: 03/01/2001 Document Revised: 11/28/2011 Document Reviewed: 09/05/2005 ExitCare Patient Information 2015 [place].  This information is not intended to replace advice given to you by your health care provider. Make sure you discuss any questions you have with your health care provider.   Radial Site Care Refer to this sheet in the next few weeks. These instructions provide you with information on caring for yourself after your procedure. Your caregiver may also give you more specific instructions. Your treatment has been planned according to current medical practices, but problems sometimes occur. Call your caregiver if you have any problems or questions after your procedure. HOME CARE INSTRUCTIONS  You may shower the day after the procedure.Remove the bandage (dressing) and gently wash the site with plain soap and water.Gently pat the site dry.  Do not apply powder or lotion to the site.  Do not submerge the affected site in water for 3 to 5 days.  Inspect the site at least twice daily.  Do not flex or bend the affected arm for 24 hours.  No lifting over 5 pounds (2.3 kg) for 5 days after your procedure.  Do not drive home if you are discharged the same day of the procedure. Have someone else drive you.  You may drive 24 hours after the procedure unless otherwise instructed by your caregiver.  Do not operate machinery or power tools for 24 hours.  A responsible adult should be with you for the first 24 hours after you arrive home. What to expect:  Any bruising will usually fade within 1 to 2 weeks.  Blood that collects in the tissue (hematoma) may be painful to the touch. It should usually decrease in size and tenderness within 1 to 2 weeks. SEEK IMMEDIATE MEDICAL CARE IF:  You have unusual pain at the radial site.  You have redness, warmth, swelling, or pain at the radial site.  You have drainage (other than a small amount of blood on the dressing).  You have chills.  You have a fever or persistent symptoms for more than 72 hours.  You have a fever and your symptoms suddenly get  worse.  Your arm becomes pale, cool, tingly, or numb.  You have heavy bleeding from the site. Hold pressure on the site. Document Released: 10/08/2010 Document Revised: 11/28/2011 Document Reviewed: 10/08/2010 [facility] Patient Information 2015 [place], [place]. This information is not intended to replace advice given to you by your health care provider. Make sure you discuss any questions you have with your health care provider.  Angiogram, Care After Refer to this sheet in the next few weeks. These instructions provide you with information on caring for yourself after your procedure. Your health care provider may also give you more specific instructions. Your treatment has been planned according to current medical practices, but problems sometimes occur. Call your health care provider if you have any problems or questions after your procedure.  WHAT TO EXPECT AFTER THE PROCEDURE After your procedure, it is typical to have the following sensations:  Minor discomfort or tenderness and a small bump at the catheter insertion site. The bump should usually decrease in size and tenderness within 1 to 2 weeks.  Any bruising will usually fade within 2 to 4 weeks. HOME CARE INSTRUCTIONS   You may need to keep taking blood thinners if they were prescribed for you. Take medicines only as directed by your health care provider.  Do not apply powder or  lotion to the site.  Do not take baths, swim, or use a hot tub until your health care provider approves.  You may shower 24 hours after the procedure. Remove the bandage (dressing) and gently wash the site with plain soap and water. Gently pat the site dry.  Inspect the site at least twice daily.  Limit your activity for the first 48 hours. Do not bend, squat, or lift anything over 20 lb (9 kg) or as directed by your health care provider.  Plan to have someone take you home after the procedure. Follow instructions about when you can drive or return to  work. SEEK MEDICAL CARE IF:  You get light-headed when standing up.  You have drainage (other than a small amount of blood on the dressing).  You have chills.  You have a fever.  You have redness, warmth, swelling, or pain at the insertion site. SEEK IMMEDIATE MEDICAL CARE IF:   You develop chest pain or shortness of breath, feel faint, or pass out.  You have bleeding, swelling larger than a walnut, or drainage from the catheter insertion site.  You develop pain, discoloration, coldness, or severe bruising in the leg or arm that held the catheter.  You have heavy bleeding from the site. If this happens, hold pressure on the site and call 911. MAKE SURE YOU:  Understand these instructions.  Will watch your condition.  Will get help right away if you are not doing well or get worse. Document Released: 03/24/2005 Document Revised: 01/20/2014 Document Reviewed: 01/28/2013 [facility] Patient Information 2015 [place], [place]. This information is not intended to replace advice given to you by your health care provider. Make sure you discuss any questions you have with your health care provider.

## 2014-06-30 NOTE — Progress Notes (Addendum)
Pressure removed from r groin. No bleeding noted, no hematoma present. Clean gauze place with opsite.  Hob flat, pedal pulses +2. Advised pt to remain flat with rle immobile until instructed otherwise. Urinal placed at bedside, denies need to void at this time. 2 cc air ermoved from tr band at r wrist, no bleeding noted, radial pulse present. Rue placed on pillow.

## 2014-06-30 NOTE — Progress Notes (Signed)
5 fr catheter removed from r groin with no complication, no obvious hematoma, no bleeding. Hob flat. No chest pain, no sob. Alert oriented.

## 2014-06-30 NOTE — CV Procedure (Signed)
CARDIAC CATHETERIZATION REPORT  Justin Cox   [id number] June 15, 1981  Performing Cardiologist: Chrystie NoseHILTY,Justin Cox Primary Physician: [name],[name], MD Primary Cardiologist:  [name]  Procedures Performed:  Left Heart Catheterization via 5 Fr right femoral artery access  Left Ventriculography, (RAO/LAO) 15 ml/sec for 30 ml total contrast  Native Coronary Angiography  Indication(s): chest pressure/discomfort and dyspnea  Pre-Procedural Diagnosis(es):  1. Abnormal nuclear stress test 2. Cardiomyopathy  Post-Procedural Diagnosis(es): 1. Non-ischemic cardiomyopathy  Pre-Procedural Non-invasive testing: Equivocal findings, possible small area of inferior ischemia, EF ranges from 35-55%  History: 33 y.o. male presented with past medical history 7 for long-standing hypertension, dyslipidemia, 2 recent TIAs, and HIV disease on medication with an undetectable viral load. He recently has been in the hospital for a second TIA. He had medication adjustments. An echocardiogram was performed which demonstrated a low normal EF of 50-55% with possible posterior basal wall motion abnormality. He is described increased frequency of chest pressure and shortness of breath with exertion over the past several weeks. 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 He does not exercise and is fairly sedentary when he is not working. There is no significant family history of heart disease however hypertension is somewhat rampant in the family.   [name] returns today for followup. He underwent a nuclear stress test which showed a reduced EF of 33% with global hypokinesis and some mild reversible ischemia although was read as low risk. The EF does contradict the findings by echocardiography, suggestive of possible multivessel coronary disease. He continues to have episodes of chest discomfort more associated with exertion.  Risks / Complications include, but not limited to: Death, MI,  CVA/TIA, VF/VT (with defibrillation), Bradycardia (need for temporary pacer placement), contrast induced nephropathy, bleeding / bruising / hematoma / pseudoaneurysm, vascular or coronary injury (with possible emergent CT or Vascular Surgery), adverse medication reactions, infection.    Consent: Risks of procedure as well as the alternatives and risks of each were explained to the (patient/caregiver).  Consent for procedure obtained.  Procedure: The patient was brought to the 2nd Floor [place] [facility] in the fasting state and prepped and draped in the usual sterile fashion for (Right groin and radial) access. A modified Allen's test with plethysmography was performed on the right wrist demonstrating adequate Ulnar Artery collateral flow.    Time Out: Verified patient identification, verified procedure, site/side was marked, verified correct patient position, special equipment/implants available, radiation safety measures in place (including badges and shielding), medications/allergies/relevent history reviewed, required imaging and test results available.  Performed  Procedure: The right wrist was anesthetized with 1% subcutaneous Lidocaine.  The right radial artery was accessed using the Seldinger Technique with placement of a 6 Fr Glide Sheath. The sheath was advanced but increasing resistance was noted. Once placed, the sheath could not be aspirated.  5 cc of radial cocktail was flushed, but no wire could be advanced due to arterial spasm.  The catheter was then removed and a TR band placed at 10 ml Air at [employer]08:22 (time).  Reverse Allen's test did reveal non-occlusive hemostasis.  Attention was then turned to the femoral artery.  The right femoral head was identified using tactile and fluoroscopic technique.  The right groin was anesthetized with 1% subcutaneous Lidocaine.  The right Common Femoral Artery was accessed using the Modified Seldinger Technique with placement of (5 Fr)  sheath using the Seldinger technique.  The sheath was aspirated and flushed.  A 5 Fr JL4  Catheter was advanced of over a Standard J wire into the ascending Aorta.  The catheter was used to engage the left coronary artery.  Multiple cineangiographic views of the left coronary artery system(s) were performed. A 5 Fr JR4 Catheter was advanced of over a Safety J wire into the ascending Aorta, this was occlusive to a small, non-dominant right coronary, therefore it was exchanged for a 79F No-torque right.  The catheter was used to engage the right coronary artery.  Multiple cineangiographic views of the right coronary artery system(s) were performed. This catheter was then exchanged over the Standard J wire for an angled Pigtail catheter that was advanced across the Aortic Valve.  LV hemodynamics were measured (and Left Ventriculography was performed).  LV hemodynamics were then re-sampled, and the catheter was pulled back across the Aortic Valve for measurement of "pull-back" gradient.  The catheter and the wire was removed completely out of the body. The patient was transferred to the holding area where the sheath was removed with manual pressure held for hemostasis.   Recovery: The patient was transported to the cath lab holding area in stable condition.   The patient  was stable before, during and following the procedure.   Patient did tolerate procedure well. There were not complications.  EBL: Minimal  Medications:  Premedication: none  Sedation:  2 mg IV Versed, 50 mcg IV Fentanyl  Contrast:  80 ml Omnipaque  Local Anesthesia: 5 cc 1% lidocaine  5 cc Radial cocktail  Hemodynamics:  Central Aortic Pressure / Mean Aortic Pressure: 114/83  LV Pressure / LV End diastolic Pressure:  19  Left Ventriculography:  EF:  35-40%  Wall Motion: global hypokinesis with basal inferior severe hypokinesis  MR: 1+  Coronary Angiographic Data:  Left Main:  Normal  Left Anterior Descending (LAD):  Large  caliber vessel, angiographically normal, wraps around the apex  1st diagonal (D1):  Large D1 without disease  2nd diagonal (D2):  Smaller mid-distal vessel  Circumflex (LCx):  Dominant, large caliber vessel without stenosis  1st obtuse marginal:  Large branch, no disease  2nd obtuse marginal: Large branch, no disease 3rd obtuse marginal: Large branch, no disease   posterior lateral branch:  Patent, no stenosis  Right Coronary Artery: Smaller vessel, non-dominant, no stenosis  Impression: 1.  No angiographically significant coronary artery disease 2.  LVEF 35-40% with global hypokinesis and more severe basal inferior hypokinesis 3.  LVEDP = 19 mmHg  Plan: 1.  This is likely a non-ischemic cardiomyopathy, possibly from HTN in the past or HIV disease/anti-retroviral medications 2.  Will start lasix 20 mg daily for elevated LVEDP of 19 mmHg, with normal systemic blood pressure 3.  Follow-up with me in 2-3 weeks.  Will further optimize heart failure medications at that time.  The case and results was discussed with the patient and family if available.  The case and results was not discussed with the patient's PCP. The case and results was discussed with the patient's Cardiologist.  Time Spent Directly with the Patient:  75 minutes  [name], MD, [facility] Attending Cardiologist [facility]  Hilda Wexler Cox 06/30/2014, 8:58 AM

## 2014-06-30 NOTE — Progress Notes (Signed)
2cc air removed from tr band, no complication

## 2014-06-30 NOTE — H&P (Signed)
     INTERVAL PROCEDURE H&P  History and Physical Interval Note:  06/30/2014 6:55 AM  Modena Nunnery has presented today for their planned procedure. The various methods of treatment have been discussed with the patient and family. After consideration of risks, benefits and other options for treatment, the patient has consented to the procedure.  The patients' outpatient history has been reviewed, patient examined, and no change in status from most recent office note within the past 30 days. I have reviewed the patients' chart and labs and will proceed as planned. Questions were answered to the patient's satisfaction.    Cath Lab Visit (complete for each Cath Lab visit)  Clinical Evaluation Leading to the Procedure:   ACS: No.  Non-ACS:    Anginal Classification: CCS III  Anti-ischemic medical therapy: Maximal Therapy (2 or more classes of medications)  Non-Invasive Test Results: Equivocal test results  Prior CABG: No previous CABG  [name], MD, [facility] Attending Cardiologist [facility]  HILTY,Kenneth C 06/30/2014, 6:55 AM

## 2014-06-30 NOTE — Progress Notes (Signed)
Resting quietly in bed, no acute distress. 2cc air removed from tr band at 1015 with no bleeding, no hematoma. Site at r groin intact, bandage clean no s/o hematoma. Report called to ss nurse. Transported to ss per stretcher in no acute distress

## 2014-07-03 ENCOUNTER — Telehealth: Payer: Self-pay | Admitting: Internal Medicine

## 2014-07-03 NOTE — Telephone Encounter (Signed)
Received form from [employer] [facility] [employer] [facility] to [facility] @ [place] to send letter/packet.  Sent 10.15.15 lp

## 2014-07-07 ENCOUNTER — Telehealth: Payer: Self-pay | Admitting: Internal Medicine

## 2014-07-08 NOTE — Telephone Encounter (Signed)
Close encounter 

## 2014-07-14 NOTE — Telephone Encounter (Signed)
10.26  Received Certification of Health Care Provider Form from [facility] for Dr Rennis Golden to review and sign.  Given to [place], RN for for Dr Rennis Golden to sign.  lp

## 2014-07-29 NOTE — Telephone Encounter (Signed)
07/25/14  Completed, signed paperwork for [employer] Disabilty & Leave given to Emi Belfast.  Paperwork was faxed to Disability & Leave Center by Woody Seller.  lp

## 2014-08-04 ENCOUNTER — Ambulatory Visit (INDEPENDENT_AMBULATORY_CARE_PROVIDER_SITE_OTHER): Payer: BC Managed Care – PPO | Admitting: Internal Medicine

## 2014-08-04 ENCOUNTER — Encounter: Payer: Self-pay | Admitting: Internal Medicine

## 2014-08-04 VITALS — BP 135/84 | HR 90 | Ht 71.0 in | Wt 202.6 lb

## 2014-08-04 DIAGNOSIS — B20 Human immunodeficiency virus [HIV] disease: Secondary | ICD-10-CM

## 2014-08-04 DIAGNOSIS — I428 Other cardiomyopathies: Secondary | ICD-10-CM

## 2014-08-04 DIAGNOSIS — I5021 Acute systolic (congestive) heart failure: Secondary | ICD-10-CM

## 2014-08-04 DIAGNOSIS — I429 Cardiomyopathy, unspecified: Secondary | ICD-10-CM

## 2014-08-04 DIAGNOSIS — I10 Essential (primary) hypertension: Secondary | ICD-10-CM

## 2014-08-04 DIAGNOSIS — Z21 Asymptomatic human immunodeficiency virus [HIV] infection status: Secondary | ICD-10-CM

## 2014-08-04 DIAGNOSIS — E785 Hyperlipidemia, unspecified: Secondary | ICD-10-CM

## 2014-08-04 DIAGNOSIS — I160 Hypertensive urgency: Secondary | ICD-10-CM

## 2014-08-04 MED ORDER — ENALAPRIL MALEATE 20 MG PO TABS: 20.0000 mg | ORAL_TABLET | Freq: Every day | ORAL | Status: DC

## 2014-08-04 NOTE — Progress Notes (Signed)
OFFICE NOTE  Chief Complaint:  Breathing better  Primary Care Physician: Georgianne Fick, MD  HPI:  Justin Cox is a pleasant 33 year old male with past medical history 7 for long-standing hypertension, dyslipidemia, 2 recent TIAs, and HIV disease on medication with an undetectable viral load. He recently has been in the hospital for a second TIA. He had medication adjustments. An echocardiogram was performed which demonstrated a low normal EF of 50-55% with possible posterior basal wall motion abnormality. He is described increased frequency of chest pressure and shortness of breath with exertion over the past several weeks. 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 He does not exercise and is fairly sedentary when he is not working. There is no significant family history of heart disease however hypertension is somewhat rampant in the family.  Justin Cox returns today for followup. He underwent a nuclear stress test which showed a reduced EF of 33% with global hypokinesis and some mild reversible ischemia although was read as low risk. The EF does contradict the findings by echocardiography, suggestive of possible multivessel coronary disease. He continues to have episodes of chest discomfort more associated with exertion.  He was referred for definitive cardiac catheterization by myself, with the following results:  Hemodynamics: Central Aortic Pressure / Mean Aortic Pressure: 114/83 LV Pressure / LV End diastolic Pressure: 19  Left Ventriculography: EF: 35-40% Wall Motion: global hypokinesis with basal inferior severe hypokinesis MR: 1+  Coronary Angiographic Data:  Left Main: Normal  Left Anterior Descending (LAD): Large caliber vessel, angiographically normal, wraps around the apex  1st diagonal (D1): Large D1 without disease  2nd diagonal (D2): Smaller mid-distal  vessel  Circumflex (LCx): Dominant, large caliber vessel without stenosis  1st obtuse marginal: Large branch, no disease  2nd obtuse marginal: Large branch, no disease 3rd obtuse marginal: Large branch, no disease  posterior lateral branch: Patent, no stenosis  Right Coronary Artery: Smaller vessel, non-dominant, no stenosis  Impression: 1. No angiographically significant coronary artery disease 2. LVEF 35-40% with global hypokinesis and more severe basal inferior hypokinesis 3. LVEDP = 19 mmHg  Plan: 1. This is likely a non-ischemic cardiomyopathy, possibly from HTN in the past or HIV disease/anti-retroviral medications 2. Will start lasix 20 mg daily for elevated LVEDP of 19 mmHg, with normal systemic blood pressure 3. Follow-up with me in 2-3 weeks. Will further optimize heart failure medications at that time.  Justin Cox returns today and is reportedly feeling much better. He is now on Lasix 20 mg daily. Blood pressure is pretty well controlled however there is room to further optimize his blood pressure and heart failure medications.  PMHx:  Past Medical History  Diagnosis Date  . Hypertension   . High cholesterol   . HIV infection   . Asthma     History reviewed. No pertinent past surgical history.  FAMHx:  Family History  Problem Relation Age of Onset  . Hypertension Other   . Hyperlipidemia Other   . Diabetes Other   . Hypertension Mother   . Diabetes Father   . Hyperlipidemia Father   . Hypertension Father     SOCHx:   reports that he quit smoking about 3 years ago. He does not have any smokeless tobacco history on file. He reports that he drinks about 1.5 oz of alcohol per week. He reports that he does not use illicit drugs.  ALLERGIES:  No Known Allergies  ROS: A comprehensive review of systems was negative except  for: Respiratory: positive for dyspnea on exertion  HOME MEDS: Current Outpatient Prescriptions  Medication Sig  Dispense Refill  . albuterol (PROVENTIL HFA;VENTOLIN HFA) 108 (90 BASE) MCG/ACT inhaler Inhale 2 puffs into the lungs every 6 (six) hours as needed for wheezing or shortness of breath.    Marland Kitchen. amLODipine (NORVASC) 10 MG tablet Take 5 mg by mouth daily.     Marland Kitchen. aspirin EC 325 MG EC tablet Take 1 tablet (325 mg total) by mouth daily. 30 tablet 0  . efavirenz-emtricitabine-tenofovir (ATRIPLA) 600-200-300 MG per tablet Take 1 tablet by mouth at bedtime.     . furosemide (LASIX) 20 MG tablet Take 1 tablet (20 mg total) by mouth daily. 30 tablet 5  . guanFACINE (TENEX) 1 MG tablet Take 1 mg by mouth at bedtime.     Marland Kitchen. ibuprofen (ADVIL,MOTRIN) 200 MG tablet Take 400 mg by mouth every 6 (six) hours as needed for pain.    . metoprolol succinate (TOPROL-XL) 100 MG 24 hr tablet Take 100 mg by mouth daily. Take with or immediately following a meal.    . omeprazole (PRILOSEC) 20 MG capsule Take 1 capsule (20 mg total) by mouth daily. 15 capsule 0  . simvastatin (ZOCOR) 20 MG tablet Take 1 tablet (20 mg total) by mouth daily at 6 PM. 30 tablet 0  . enalapril (VASOTEC) 20 MG tablet Take 1 tablet (20 mg total) by mouth daily. 30 tablet 6   No current facility-administered medications for this visit.    LABS/IMAGING: No results found for this or any previous visit (from the past 48 hour(s)). No results found.  VITALS: BP 135/84 mmHg  Pulse 90  Ht 5\' 11"  (1.803 m)  Wt 202 lb 9.6 oz (91.899 kg)  BMI 28.27 kg/m2  EXAM: General appearance: alert and no distress Neck: no carotid bruit, no JVD and thyroid not enlarged, symmetric, no tenderness/mass/nodules Lungs: clear to auscultation bilaterally Heart: regular rate and rhythm, S1, S2 normal, no murmur, click, rub or gallop Abdomen: soft, non-tender; bowel sounds normal; no masses,  no organomegaly Extremities: extremities normal, atraumatic, no cyanosis or edema Pulses: 2+ and symmetric Skin: Skin color, texture, turgor normal. No rashes or  lesions Neurologic: Grossly normal Psych: Pleasant  EKG: deferred  ASSESSMENT:  DOE - improved  Nonischemic cardiomyopathy, New York Heart Association class II symptoms, EF 35-40%  Normal coronary arteries by recent heart catheterization  Long-standing hypertension  Dyslipidemia  Recent TIA x2  HIV disease on appropriate medication, undetectable viral load  PLAN: 1.   Justin Cox does indeed have a nonischemic cardio myopathy with EF 35-40%. He currently has class 1-2 symptoms and is improved with the addition of Lasix. I would like to further optimize his heart failure medications and would recommend discontinuing enalapril HCTZ and replacing it with enalapril at a higher dose of 20 mg daily. I also feel we can decrease his Norvasc to 5 mg daily as it somewhat contradicts heart failure therapy. We will plan to see him back in 6 months. If need be, further up titration of his ACE inhibitor or beta blocker could be made as blood pressure allows.  Chrystie NoseKenneth C. Sherriann Szuch, MD, [facility] Attending Cardiologist [facility]  Jania Steinke C 08/04/2014, 11:08 AM

## 2014-08-04 NOTE — Patient Instructions (Addendum)
Your physician has recommended you make the following change in your medication...  1. STOP enalapril-hctz 2. START enalapril 20mg  once daily 3. DECREASE amlodipine to 5mg  once daily   Your physician wants you to follow-up in: 6 months with Dr. Rennis Golden. You will receive a reminder letter in the mail two months in advance. If you don't receive a letter, please call our office to schedule the follow-up appointment.  You have been referred to Cardiac Rehab @ [facility]

## 2014-08-06 ENCOUNTER — Telehealth: Payer: Self-pay | Admitting: *Deleted

## 2014-08-06 NOTE — Telephone Encounter (Signed)
Faxed order for phase 2 caridac rehab (for CHF) - no graded GXT required

## 2014-08-11 ENCOUNTER — Telehealth: Payer: Self-pay | Admitting: *Deleted

## 2014-08-11 NOTE — Telephone Encounter (Signed)
Faxed orders for maintenance cardiac rehab referral program - cardiomyopathy, htn, hyperlipidemia

## 2014-08-12 ENCOUNTER — Telehealth (HOSPITAL_COMMUNITY): Payer: Self-pay | Admitting: *Deleted

## 2014-08-12 ENCOUNTER — Telehealth: Payer: Self-pay | Admitting: Internal Medicine

## 2014-08-12 NOTE — Telephone Encounter (Signed)
LMTCB

## 2014-08-12 NOTE — Telephone Encounter (Signed)
Please call,concerning his restrictions for work for his disability.

## 2014-08-12 NOTE — Telephone Encounter (Signed)
Please her after 1:30,this is concerning Justin Cox.

## 2014-08-12 NOTE — Telephone Encounter (Signed)
Spoke with patient - he needs a date for the "lifting restrictions" - Larita Fife provided copy of disability forms. Will have Dr. Rennis Golden address on 11/25

## 2014-08-12 NOTE — Telephone Encounter (Signed)
Pt. Wants you or Dr. Rennis Golden to call him about his disability from work

## 2014-08-12 NOTE — Telephone Encounter (Signed)
Spoke with [employer]. She will have maria return call.

## 2014-08-12 NOTE — Telephone Encounter (Signed)
Spoke with Byrd Hesselbach. She will try to contact patient again for cardiac rehab program set up. She states he will need to check w/his insurance for coverage for this.

## 2014-08-13 ENCOUNTER — Telehealth: Payer: Self-pay | Admitting: Internal Medicine

## 2014-08-13 NOTE — Telephone Encounter (Signed)
Lifting restrictions documented to end on 08/04/14. Forms given to [facility], Medical Records to be faxed.

## 2014-08-13 NOTE — Telephone Encounter (Signed)
Received updated [employer] Form from Dr Rennis Golden notating restriction dates.  Faxed to [facility] 08/13/14 lp

## 2014-08-28 ENCOUNTER — Encounter (HOSPITAL_COMMUNITY): Payer: Self-pay | Admitting: Internal Medicine

## 2014-09-02 ENCOUNTER — Encounter: Payer: Self-pay | Admitting: Neurology

## 2014-09-02 ENCOUNTER — Ambulatory Visit (INDEPENDENT_AMBULATORY_CARE_PROVIDER_SITE_OTHER): Payer: BC Managed Care – PPO | Admitting: Neurology

## 2014-09-02 VITALS — BP 130/86 | HR 75 | Ht 71.0 in | Wt 202.4 lb

## 2014-09-02 DIAGNOSIS — I10 Essential (primary) hypertension: Secondary | ICD-10-CM

## 2014-09-02 DIAGNOSIS — G459 Transient cerebral ischemic attack, unspecified: Secondary | ICD-10-CM

## 2014-09-02 DIAGNOSIS — E785 Hyperlipidemia, unspecified: Secondary | ICD-10-CM

## 2014-09-02 DIAGNOSIS — G4733 Obstructive sleep apnea (adult) (pediatric): Secondary | ICD-10-CM | POA: Insufficient documentation

## 2014-09-02 DIAGNOSIS — I429 Cardiomyopathy, unspecified: Secondary | ICD-10-CM | POA: Insufficient documentation

## 2014-09-02 NOTE — Progress Notes (Addendum)
STROKE NEUROLOGY FOLLOW UP NOTE  NAME: Justin Cox DOB: Dec 11, 1980  REASON FOR VISIT: stroke follow up HISTORY FROM: pt and chart  Today we had the pleasure of seeing Justin Nunneryaron Bines in follow-up at our Neurology Clinic. Pt was accompanied by no one.   History Summary 33 y.o. male history of uncontrolled HTN, HLD, asthma and HIV infection presenting with acute onset 2 episodes of numbness and weakness involving his left arm and leg as well as slurry speech, resolved in about 2-3 hours. CT scan of his head showed no acute intracranial abnormality. MRI negative for acute stroke. And negative CTA head and neck. Blood pressure was noted to be elevated on arrival 189/109.  He has been having HTN for 10 years but only recently follow up at outpt working up for the etiology of HTN. His HTN was not controlled in the past. He was also found low normal EF at 50% on 2D echo and he complains of daily SOB. 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 He had hx of syphilis and got treated in health department and [facility]  During admission, his BP got better control with multiple anti-HTN meds and discharged home with ASA 325 and zocor for stroke prevention. And he was to follow up in clinic with cardiology, neurology, ID and PCP.  Follow up 05/27/14 - the patient has been doing fine. He stated that he only had one episode of left arm feeling cold for an hour last week. No other neurological complaint.  He checked his BP at home daily, usually runs 125/80. Today in clinic is 134/88. He is still on norvasc, enalapril, HCTZ and metoprolol.  He had cardiology follow up and did stress test, which showed a small region of qualitative without statistically significant mild basal inferior ischemia and LVEF 33%. He still complains of SOB and he is going to see Dr. Rennis GoldenHilty again on 9/28. He has not followed up with his ID doctor and PCP yet. He stated that he will  see them in Oct and Nov, respectively.   Interval History During the interval time, he is doing well without recurrent neurological symptoms. He was seen by Dr. Rennis GoldenHilty and cardiac cath was done showed EF 35-40%, but all the coronary vessels were patent. He was considered with non-ischemic cardiomyopathy due to HTN or HIV or HAAT therapy related. He was put on lasix 20mg , ACEI and BB as well as continued with amlodipine. He feels much better and his BP at home around 135/80 and today in clinic is 130/86.   He stated that he followed with [facility] and was told everything is good. He stated that when he lives with his parents, they complained that he was snoring during sleep and he also felt sleepiness in the pm. Pt complains of intermittent blurry vision, not very frequent, short lasting, he did not check BP at that time. No headache or N/V.  REVIEW OF SYSTEMS: Full 14 system review of systems performed and notable only for those listed below and in HPI above, all others are negative:  Constitutional: appetite change  Cardiovascular: N/A  Ear/Nose/Throat: trouble swallowing Skin: N/A  Eyes: eye redness  Respiratory: SOB  Gastroitestinal: N/A  Genitourinary: N/A Hematology/Lymphatic: N/A  Endocrine: excessive thirst  Musculoskeletal: N/A  Allergy/Immunology: N/A  Neurological: N/A  Psychiatric: snoring  The following represents the patient's updated allergies and side effects list: No  Known Allergies  Labs since last visit of relevance include the following: Results for orders placed or performed in visit on 06/16/14  APTT  Result Value Ref Range   aPTT 31 24 - 37 seconds  Basic metabolic panel  Result Value Ref Range   Sodium 139 135 - 145 mEq/L   Potassium 3.5 3.5 - 5.3 mEq/L   Chloride 101 96 - 112 mEq/L   CO2 23 19 - 32 mEq/L   Glucose, Bld 120 (H) 70 - 99 mg/dL   BUN 9 6 - 23 mg/dL   Creat 1.12 1.62 - 4.46 mg/dL   Calcium 9.5 8.4 - 95.0 mg/dL  CBC  Result  Value Ref Range   WBC 4.6 4.0 - 10.5 K/uL   RBC 4.49 4.22 - 5.81 MIL/uL   Hemoglobin 15.6 13.0 - 17.0 g/dL   HCT 72.2 57.5 - 05.1 %   MCV 96.2 78.0 - 100.0 fL   MCH 34.7 (H) 26.0 - 34.0 pg   MCHC 36.1 (H) 30.0 - 36.0 g/dL   RDW 83.3 58.2 - 51.8 %   Platelets 188 150 - 400 K/uL  Protime-INR  Result Value Ref Range   Prothrombin Time 13.1 11.6 - 15.2 seconds   INR 0.99 <1.50  TSH  Result Value Ref Range   TSH 0.755 0.350 - 4.500 uIU/mL    The neurologically relevant items on the patient's problem list were reviewed on today's visit.  Neurologic Examination  A problem focused neurological exam (12 or more points of the single system neurologic examination, vital signs counts as 1 point, cranial nerves count for 8 points) was performed.  Blood pressure 130/86, pulse 75, height 5\' 11"  (1.803 m), weight 202 lb 6.4 oz (91.808 kg).  General - Well nourished, well developed, in no apparent distress.  Ophthalmologic - Sharp disc margins OU.  Cardiovascular - Regular rate and rhythm with no murmur.  Mental Status -  Level of arousal and orientation to time, place, and person were intact. Language including expression, naming, repetition, comprehension was assessed and found intact. Attention span and concentration were normal. Fund of Knowledge was assessed and was intact.  Cranial Nerves II - XII - II - Visual field intact OU. III, IV, VI - Extraocular movements intact. V - Facial sensation intact bilaterally. VII - Facial movement intact bilaterally. VIII - Hearing & vestibular intact bilaterally. X - Palate elevates symmetrically. XI - Chin turning & shoulder shrug intact bilaterally. XII - Tongue protrusion intact.  Motor Strength - The patient's strength was normal in all extremities and pronator drift was absent.  Bulk was normal and fasciculations were absent.   Motor Tone - Muscle tone was assessed at the neck and appendages and was normal.  Reflexes - The patient's  reflexes were normal in all extremities and he had no pathological reflexes.  Sensory - Light touch, temperature/pinprick and Romberg testing were assessed and were normal.    Coordination - The patient had normal movements in the hands and feet with no ataxia or dysmetria.  Tremor was absent.  Gait and Station - The patient's transfers, posture, gait, station, and turns were observed as normal.  Data reviewed: I personally reviewed the images and agree with the radiology interpretations.  Ct Angio Head W/cm &/or Wo Cm  04/11/2014  Negative CTA head and neck.  Ct Head Wo Contrast  04/10/2014  Negative CT head.  Ct Angio Neck W/cm &/or Wo/cm  04/11/2014  Negative CTA head and neck.  Mr Brain  Wo Contrast  04/10/2014  1. Normal MRI appearance of the brain.  2. Mild to moderate paranasal sinus inflammatory changes. Physiologic appearing adenoid and tonsillar hypertrophy.  2D Echocardiogram - Left ventricle: The cavity size was normal. Wall thickness was increased in a pattern of moderate LVH. Systolic function was normal. The estimated ejection fraction was in the range of 50% to 55%. Possible hypokinesis of the basal posterior myocardium. Doppler parameters are consistent with abnormal left ventricular relaxation (grade 1 diastolic dysfunction). Impressions: - Low normal LV function; possible hypokinesis of the basal posterior myocardium; no significant valvular abnormality.  Stress test 05/16/14 Exercise Capacity: Lexiscan with no exercise. BP Response: Normal blood pressure response. Clinical Symptoms: Mild shortness of breath. ECG Impression: No significant ST segment change suggestive of  ischemia. Comparison with Prior Nuclear Study: No previous nuclear study  performed Overall Impression: Low risk stress nuclear study demonstrating  a small region of qualitative without statistically significant  mild basal inferior ischemia. LV Wall Motion: Moderate global hypokinesis with  EF 33%  Cardiac cath 06/30/14   Hemodynamics: Central Aortic Pressure / Mean Aortic Pressure: 114/83 LV Pressure / LV End diastolic Pressure: 19  Left Ventriculography: EF: 35-40% Wall Motion: global hypokinesis with basal inferior severe hypokinesis MR: 1+  Coronary Angiographic Data:  Left Main: Normal  Left Anterior Descending (LAD): Large caliber vessel, angiographically normal, wraps around the apex  1st diagonal (D1): Large D1 without disease  2nd diagonal (D2): Smaller mid-distal vessel  Circumflex (LCx): Dominant, large caliber vessel without stenosis  1st obtuse marginal: Large branch, no disease  2nd obtuse marginal: Large branch, no disease 3rd obtuse marginal: Large branch, no disease  posterior lateral branch: Patent, no stenosis  Right Coronary Artery: Smaller vessel, non-dominant, no stenosis  Impression: 1. No angiographically significant coronary artery disease 2. LVEF 35-40% with global hypokinesis and more severe basal inferior hypokinesis 3. LVEDP = 19 mmHg  EKG - Sinus tachycardia rate 100 beats per minute. LVH by voltage. Borderline T abnormalities, inferior leads. Anterior ST elevation, probably due to LVH. Since last tracing rate faster (29 Mar 2006) T wave inversion now evident in Lateral leads.  Component     Latest Ref Rng 04/11/2014  Cholesterol     0 - 200 mg/dL 161 (H)  Triglycerides     <150 mg/dL 096 (H)  HDL     >04 mg/dL 42  Total CHOL/HDL Ratio      5.2  VLDL     0 - 40 mg/dL 57 (H)  LDL (calc)     0 - 99 mg/dL 540 (H)  Hemoglobin J8J     <5.7 % 5.6  Mean Plasma Glucose     <117 mg/dL 191  TSH     4.782 - 9.562 uIU/mL 0.649  Free T4     0.80 - 1.80 ng/dL 1.30  Vitamin Q-65     211 - 911 pg/mL 336  Folate      7.6  RPR     NON REAC Reactive (A)  Sed Rate     0 - 16 mm/hr 8  CRP, High Sensitivity      5.5 (H)  RPR Titer     NON  REACTIVE 1:4 (A)  Fluorescent Treponemal AB, IgG      Reactive (A)   Assessment: As you may recall, he is a 33 y.o. African American male with PMH of uncontrolled HTN, HLD, asthma and HIV infection presenting with acute onset 2 episodes of numbness and weakness  involving his left arm and leg as well as slurry speech, resolved in about 2-3 hours in 03/2014. He has risk factor for stroke with HTN, HLD, HIV, low EF. Cardiac cath showed no coronary vessel stenosis but EF 35-40%, likely non-ischemic cardiomyopathy related to HTN, HIV, or HARRT. He is on ASA and zocor for stroke prevention, lasix, ACEI, BB for cardiomyopathy.  HTN seems controlled well currently. He has RPR 1:4 and previously treated for syphilis, and he follows up with his ID doctor in [place]. Has sign of OSA and will do sleep study. Intermittent blurry vision may related to hypertension, recommend to check BP twice a day. If BP still high in the pm, may consider to spread out BP meds.  Plan:  - continue ASA 325mg  and zocor for stroke prevention - continue current meds for Bp control and cardiomyopathy - Check Bp at home and bring to PCP for medication adjustment - follow up with cardiology and ID. - Follow up with your primary care physician for stroke risk factor modification. Recommend maintain blood pressure goal <130/80, diabetes with hemoglobin A1c goal below 6.5% and lipids with LDL cholesterol goal below 70 mg/dL.  - sleep study for evaluation of OSA - RTC in 6 months.  Orders Placed This Encounter  Procedures  . Ambulatory referral to Sleep Studies    Referral Priority:  Routine    Referral Type:  Consultation    Referral Reason:  Specialty Services Required    Number of Visits Requested:  1    Patient Instructions  - continue ASA and zocor for stroke prevention - continue home medication for BP control and cardiac prevention. - Follow up with your primary care physician for stroke risk factor modification. Recommend  maintain blood pressure goal <130/80, diabetes with hemoglobin A1c goal below 6.5% and lipids with LDL cholesterol goal below 70 mg/dL.  - check BP twice a day for two weeks and record and bring to PCP for medication adjustment. - regular exercise - eat healthy - sleep study referral - follow up in 6 months.    Marvel Plan, MD PhD [facility] [address], [address] [place], [place] [address] [phone]

## 2014-09-02 NOTE — Patient Instructions (Addendum)
-   continue ASA and zocor for stroke prevention - continue home medication for BP control and cardiac prevention. - Follow up with your primary care physician for stroke risk factor modification. Recommend maintain blood pressure goal <130/80, diabetes with hemoglobin A1c goal below 6.5% and lipids with LDL cholesterol goal below 70 mg/dL.  - check BP twice a day for two weeks and record and bring to PCP for medication adjustment. - regular exercise - eat healthy - sleep study referral - follow up in 6 months.

## 2014-09-16 ENCOUNTER — Telehealth: Payer: Self-pay | Admitting: Neurology

## 2014-09-16 DIAGNOSIS — G4719 Other hypersomnia: Secondary | ICD-10-CM

## 2014-09-16 DIAGNOSIS — E663 Overweight: Secondary | ICD-10-CM

## 2014-09-16 DIAGNOSIS — R0683 Snoring: Secondary | ICD-10-CM

## 2014-09-16 NOTE — Telephone Encounter (Signed)
Sleep study request review: This patient has an underlying medical history of HIV d/s, asthma, HLP and overweight state and is referred by Dr. Roda Shutters for an attended sleep study due to a report of snoring and EDS. I will order a split-night sleep study and see the patient in sleep medicine consultation afterwards. Please print this note and attach to chart.   Technologist instructions: Please score at 3% and split if 2 hour estimated AHI >15/h.    Huston Foley, MD, PhD [facility])

## 2014-09-16 NOTE — Telephone Encounter (Signed)
Dr. Marvel Plan, refers patient for attended sleep study.  Height: 5'11"  Weight: 202 lb 6.4 oz   BMI: 28.24  Past Medical History:  Hypertension    . High cholesterol   . HIV infection   . Asthma     Sleep Symptoms: sleepiness in the pm, snoring   Epworth Score: Unable to reach patient   Medication:  Albuterol Sulfate (Aero Soln) PROVENTIL HFA;VENTOLIN HFA 108 (90 BASE) MCG/ACT Inhale 2 puffs into the lungs every 6 (six) hours as needed for wheezing or shortness of breath.       AmLODIPine Besylate (Tab) NORVASC 10 MG Take 5 mg by mouth daily.       Aspirin (Tablet Delayed Response) aspirin 325 MG Take 1 tablet (325 mg total) by mouth daily.      Efavirenz-Emtricitab-Tenofovir (Tab) ATRIPLA 600-200-300 MG Take 1 tablet by mouth at bedtime.       Enalapril Maleate (Tab) VASOTEC 20 MG Take 1 tablet (20 mg total) by mouth daily.      Furosemide (Tab) LASIX 20 MG Take 1 tablet (20 mg total) by mouth daily.      GuanFACINE HCl (Tab) TENEX 1 MG Take 1 mg by mouth at bedtime.       Hydrocodone-Acetaminophen (Tab) NORCO/VICODIN 5-325 MG Take by mouth.      Ibuprofen (Tab) ADVIL,MOTRIN 200 MG Take 400 mg by mouth every 6 (six) hours as needed for pain.      Metoprolol Succinate (Tablet SR 24 hr) TOPROL-XL 100 MG Take 100 mg by mouth daily. Take with or immediately following a meal.      Omeprazole (Capsule Delayed Release) PRILOSEC 20 MG Take 1 capsule (20 mg total) by mouth daily.      Simvastatin (Tab) ZOCOR 20 MG Take 1 tablet (20 mg total) by mouth daily at 6 PM.       Ins: [employer]   Assessment & Plan: As you may recall, he is a 33 y.o. African American male with PMH of uncontrolled HTN, HLD, asthma and HIV infection presenting with acute onset 2 episodes of numbness and weakness involving his left arm and leg as well as slurry speech, resolved in about 2-3 hours in 03/2014. He has risk factor for stroke with HTN, HLD, HIV, low EF. 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 He is on ASA and zocor for stroke prevention, lasix, ACEI, BB for cardiomyopathy. HTN seems controlled well currently. He has RPR 1:4 and previously treated for syphilis, and he follows up with his ID doctor in [place]. Has sign of OSA and will do sleep study. Intermittent blurry vision may related to hypertension, recommend to check BP twice a day. If BP still high in the pm, may consider to spread out BP meds.  Plan:  - continue ASA 325mg  and zocor for stroke prevention - continue current meds for Bp control and cardiomyopathy - Check Bp at home and bring to PCP for medication adjustment - follow up with cardiology and ID. - Follow up with your primary care physician for stroke risk factor modification. Recommend maintain blood pressure goal <130/80, diabetes with hemoglobin A1c goal below 6.5% and lipids with LDL cholesterol goal below 70 mg/dL.  - sleep study for evaluation of OSA - RTC in 6 months.  Orders Placed This Encounter  Procedures  . Ambulatory referral to Sleep Studies    Referral Priority:  Routine    Referral  Type:  Consultation    Referral Reason:  Specialty Services Required    Number of Visits Requested:  1    Patient Instructions  - continue ASA and zocor for stroke prevention - continue home medication for BP control and cardiac prevention. - Follow up with your primary care physician for stroke risk factor modification. Recommend maintain blood pressure goal <130/80, diabetes with hemoglobin A1c goal below 6.5% and lipids with LDL cholesterol goal below 70 mg/dL.  - check BP twice a day for two weeks and record and bring to PCP for medication adjustment. - regular exercise - eat healthy - sleep study referral - follow up in 6 months.    Please review patient information and submit instructions for scheduling and orders  for sleep technologist. Thank you.

## 2014-09-25 ENCOUNTER — Encounter: Payer: Self-pay | Admitting: *Deleted

## 2014-09-25 ENCOUNTER — Telehealth: Payer: Self-pay | Admitting: Internal Medicine

## 2014-09-25 NOTE — Telephone Encounter (Signed)
Spoke with pt, he reports the insurance company needs a note on letter head saying he was placed on lifting restrictions. Letter with restrictions starting on 06-30-14 faxed to [phone].

## 2014-09-25 NOTE — Telephone Encounter (Signed)
Pt called in wanting to speak to Dr. Blanchie Dessert nurse about a correction that needed to be made to his leave of absence paperwork. Please call Thanks

## 2014-10-01 ENCOUNTER — Encounter: Payer: Self-pay | Admitting: Internal Medicine

## 2014-10-14 ENCOUNTER — Telehealth: Payer: Self-pay | Admitting: Internal Medicine

## 2014-10-14 NOTE — Telephone Encounter (Signed)
We have addressed this issue. Sent in clarification of work status last week.  Dr. Rexene Edison

## 2014-10-14 NOTE — Telephone Encounter (Signed)
Please call,need to know when patient will be able to return to work.Also if there will be any restrictions.

## 2014-10-14 NOTE — Telephone Encounter (Signed)
Clarified work restriction w/ Programmer, systems. Encounter closed.

## 2014-11-10 ENCOUNTER — Telehealth: Payer: Self-pay | Admitting: Neurology

## 2014-11-10 NOTE — Telephone Encounter (Signed)
Please be advised that several attempts have been made to contact your patient for a sleep study evaluation.  He has not responded to phone calls nor messages left on voicemail requesting a call back.  This referral will be closed at this time unless otherwise notified.

## 2014-11-17 ENCOUNTER — Telehealth: Payer: Self-pay | Admitting: Internal Medicine

## 2014-11-17 NOTE — Telephone Encounter (Signed)
Returned call to patient. He states today he received an "accomadation request" from his HR department at work regarding his job in relation to his lifting restrictions. It appears the issue is there an discrepancy in the requirements of his job title (needs to be able to lift >25lbs) and that Dr. Blanchie Dessert last letter said "no lifting >10lbs, duration indefinite". Advised patient it would be best if we had a copy of the "accomdation request" from his HR department in order to best decide what information is needed and what Dr. Rennis Golden feels he is safely able to take on in his place of work. He will bring copy of paperwork by the office and is aware Dr. Rennis Golden will not be in the office until Wednesday March 2nd.

## 2014-11-17 NOTE — Telephone Encounter (Signed)
Pt called in stating that he would like to speak to [facility] in reference to his weight restrictions. Please f/u  Thanks

## 2014-11-18 NOTE — Telephone Encounter (Signed)
We can provide an amended restriction to increase lifting to >25 lbs as long as he is willing and able to do that.  Dr. Rexene Edison

## 2014-11-19 NOTE — Telephone Encounter (Signed)
"  accomadation request" letter from patient's place of work on Dr. Blanchie Dessert cart for reference.

## 2014-11-20 ENCOUNTER — Encounter: Payer: Self-pay | Admitting: *Deleted

## 2014-11-20 NOTE — Telephone Encounter (Signed)
Letter composed. Will have MD sign 3/4

## 2014-11-20 NOTE — Telephone Encounter (Signed)
I read the letter .Marland Kitchen Please provide a letter saying he can lift >25 lbs, if that is what they require since he wants to work.  Dr. Rexene Edison

## 2014-11-24 NOTE — Telephone Encounter (Signed)
Letter for accomodation request for job was faxed to [facility] @ [phone]  Patient notified and he will pick up copy of letter and he is aware that this was faxed as well.

## 2015-01-05 ENCOUNTER — Telehealth: Payer: Self-pay | Admitting: *Deleted

## 2015-01-05 NOTE — Telephone Encounter (Signed)
Called and left the pt a message asking him to call back to the office and reschedule his follow up appt since Dr. Roda Shutters will not be in the office 03/04/15. When he calls, please reschedule him

## 2015-02-10 ENCOUNTER — Telehealth: Payer: Self-pay | Admitting: *Deleted

## 2015-02-11 ENCOUNTER — Encounter: Payer: Self-pay | Admitting: *Deleted

## 2015-02-11 NOTE — Telephone Encounter (Signed)
Letter mailed to patient re: please call back to reschedule. Your appt was cancelled due to provider, and we were unable to reach you.

## 2015-02-28 ENCOUNTER — Other Ambulatory Visit: Payer: Self-pay | Admitting: Internal Medicine

## 2015-03-04 ENCOUNTER — Ambulatory Visit: Payer: BC Managed Care – PPO | Admitting: Neurology

## 2015-10-27 ENCOUNTER — Emergency Department (HOSPITAL_COMMUNITY)
Admission: EM | Admit: 2015-10-27 | Discharge: 2015-10-27 | Disposition: A | Discharge status: Home / Self Care | Payer: BLUE CROSS/BLUE SHIELD | Attending: Emergency Medicine | Admitting: Emergency Medicine

## 2015-10-27 ENCOUNTER — Encounter (HOSPITAL_COMMUNITY): Payer: Self-pay

## 2015-10-27 DIAGNOSIS — I10 Essential (primary) hypertension: Secondary | ICD-10-CM | POA: Diagnosis not present

## 2015-10-27 DIAGNOSIS — R110 Nausea: Secondary | ICD-10-CM | POA: Insufficient documentation

## 2015-10-27 DIAGNOSIS — Z9889 Other specified postprocedural states: Secondary | ICD-10-CM | POA: Diagnosis not present

## 2015-10-27 DIAGNOSIS — R002 Palpitations: Secondary | ICD-10-CM | POA: Insufficient documentation

## 2015-10-27 DIAGNOSIS — Z7982 Long term (current) use of aspirin: Secondary | ICD-10-CM | POA: Insufficient documentation

## 2015-10-27 DIAGNOSIS — Z79899 Other long term (current) drug therapy: Secondary | ICD-10-CM | POA: Diagnosis not present

## 2015-10-27 DIAGNOSIS — R42 Dizziness and giddiness: Secondary | ICD-10-CM | POA: Diagnosis not present

## 2015-10-27 DIAGNOSIS — B20 Human immunodeficiency virus [HIV] disease: Secondary | ICD-10-CM | POA: Diagnosis not present

## 2015-10-27 DIAGNOSIS — J45909 Unspecified asthma, uncomplicated: Secondary | ICD-10-CM | POA: Diagnosis not present

## 2015-10-27 DIAGNOSIS — R55 Syncope and collapse: Secondary | ICD-10-CM | POA: Insufficient documentation

## 2015-10-27 DIAGNOSIS — E7800 Pure hypercholesterolemia, unspecified: Secondary | ICD-10-CM | POA: Insufficient documentation

## 2015-10-27 DIAGNOSIS — Z87891 Personal history of nicotine dependence: Secondary | ICD-10-CM | POA: Insufficient documentation

## 2015-10-27 DIAGNOSIS — R5383 Other fatigue: Secondary | ICD-10-CM | POA: Diagnosis not present

## 2015-10-27 LAB — CBC
HCT: 45.8 % (ref 39.0–52.0)
Hemoglobin: 15.7 g/dL (ref 13.0–17.0)
MCH: 35.1 pg — ABNORMAL HIGH (ref 26.0–34.0)
MCHC: 34.3 g/dL (ref 30.0–36.0)
MCV: 102.5 fL — ABNORMAL HIGH (ref 78.0–100.0)
Platelets: 147 10*3/uL — ABNORMAL LOW (ref 150–400)
RBC: 4.47 MIL/uL (ref 4.22–5.81)
RDW: 13.7 % (ref 11.5–15.5)
WBC: 4.1 10*3/uL (ref 4.0–10.5)

## 2015-10-27 LAB — BASIC METABOLIC PANEL
Anion gap: 10 (ref 5–15)
BUN: 8 mg/dL (ref 6–20)
CO2: 23 mmol/L (ref 22–32)
Calcium: 8.9 mg/dL (ref 8.9–10.3)
Chloride: 107 mmol/L (ref 101–111)
Creatinine, Ser: 0.86 mg/dL (ref 0.61–1.24)
GFR calc Af Amer: >60 mL/min (ref >60)
GFR calc non Af Amer: >60 mL/min (ref >60)
Glucose, Bld: 146 mg/dL — ABNORMAL HIGH (ref 65–99)
Potassium: 3.5 mmol/L (ref 3.5–5.1)
Sodium: 140 mmol/L (ref 135–145)

## 2015-10-27 LAB — URINALYSIS, ROUTINE W REFLEX MICROSCOPIC
Bilirubin Urine: NEGATIVE
Glucose, UA: NEGATIVE mg/dL
Hgb urine dipstick: NEGATIVE
Ketones, ur: 15 mg/dL — AB
Leukocytes, UA: NEGATIVE
Nitrite: NEGATIVE
Protein, ur: NEGATIVE mg/dL
Specific Gravity, Urine: 1.022 (ref 1.005–1.030)
pH: 7 (ref 5.0–8.0)

## 2015-10-27 LAB — CBG MONITORING, ED: Glucose-Capillary: 193 mg/dL — ABNORMAL HIGH (ref 65–99)

## 2015-10-27 MED ORDER — SODIUM CHLORIDE 0.9 % IV BOLUS (SEPSIS)
1000.0000 mL | Freq: Once | INTRAVENOUS | Status: AC
  Administered 2015-10-27: 1000 mL via INTRAVENOUS

## 2015-10-27 NOTE — Discharge Instructions (Signed)
Near-Syncope Near-syncope (commonly known as near fainting) is sudden weakness, dizziness, or feeling like you might pass out. During an episode of near-syncope, you may also develop pale skin, have tunnel vision, or feel sick to your stomach (nauseous). Near-syncope may occur when getting up after sitting or while standing for a long time. It is caused by a sudden decrease in blood flow to the brain. This decrease can result from various causes or triggers, most of which are not serious. However, because near-syncope can sometimes be a sign of something serious, a medical evaluation is required. The specific cause is often not determined. HOME CARE INSTRUCTIONS  Monitor your condition for any changes. The following actions may help to alleviate any discomfort you are experiencing:  Have someone stay with you until you feel stable.  Lie down right away and prop your feet up if you start feeling like you might faint. Breathe deeply and steadily. Wait until all the symptoms have passed. Most of these episodes last only a few minutes. You may feel tired for several hours.   Drink enough fluids to keep your urine clear or pale yellow.   If you are taking blood pressure or heart medicine, get up slowly when seated or lying down. Take several minutes to sit and then stand. This can reduce dizziness.  Follow up with your health care provider as directed. SEEK IMMEDIATE MEDICAL CARE IF:   You have a severe headache.   You have unusual pain in the chest, abdomen, or back.   You are bleeding from the mouth or rectum, or you have black or tarry stool.   You have an irregular or very fast heartbeat.   You have repeated fainting or have seizure-like jerking during an episode.   You faint when sitting or lying down.   You have confusion.   You have difficulty walking.   You have severe weakness.   You have vision problems.  MAKE SURE YOU:   Understand these instructions.  Will  watch your condition.  Will get help right away if you are not doing well or get worse.   This information is not intended to replace advice given to you by your health care provider. Make sure you discuss any questions you have with your health care provider.   Document Released: 09/05/2005 Document Revised: 09/10/2013 Document Reviewed: 02/08/2013 Elsevier Interactive Patient Education 2016 Elsevier Inc.  Holter Monitoring A Holter monitor is a small device that is used to detect abnormal heart rhythms. It clips to your clothing and is connected by wires to flat, sticky disks (electrodes) that attach to your chest. It is worn continuously for 24-48 hours. HOME CARE INSTRUCTIONS  Wear your Holter monitor at all times, even while exercising and sleeping, for as long as directed by your health care provider.  Make sure that the Holter monitor is safely clipped to your clothing or close to your body as recommended by your health care provider.  Do not get the monitor or wires wet.  Do not put body lotion or moisturizer on your chest.  Keep your skin clean.  Keep a diary of your daily activities, such as walking and doing chores. If you feel that your heartbeat is abnormal or that your heart is fluttering or skipping a beat:  Record what you are doing when it happens.  Record what time of day the symptoms occur.  Return your Holter monitor as directed by your health care provider.  Keep all follow-up visits as  directed by your health care provider. This is important. SEEK IMMEDIATE MEDICAL CARE IF:  You feel lightheaded or you faint.  You have trouble breathing.  You feel pain in your chest, upper arm, or jaw.  You feel sick to your stomach and your skin is pale, cool, or damp.  You heartbeat feels unusual or abnormal.   This information is not intended to replace advice given to you by your health care provider. Make sure you discuss any questions you have with your health  care provider.   Document Released: 06/03/2004 Document Revised: 09/26/2014 Document Reviewed: 04/14/2014 Elsevier Interactive Patient Education [employer]

## 2015-10-27 NOTE — ED Notes (Signed)
Per EMS, pt from home.  Pt c/o syncope.  Pt was sitting in chair.  Fell to floor.  Friends witnessed.  Pt states he was dizzy and then woke up on floor.  No hx of same.  No recent illness.  No chest pain/no shortness of breath, no n/v.  Oriented x 4 on EMS arrival.  Vitals: 137/95, hr 84, 98% ra, cbg 115. 16g LAC.

## 2015-10-27 NOTE — ED Provider Notes (Signed)
CSN: [id number]     Arrival date & time 10/27/15  1337 History   First MD Initiated Contact with Patient 10/27/15 1501     Chief Complaint  Patient presents with  . Loss of Consciousness     (Consider location/radiation/quality/duration/timing/severity/associated sxs/prior Treatment) HPI Comments: Patient presents with a near-syncopal episode. He has a history of hypertension, hyperlipidemia, asthma and HIV. He states that his viral load is undetectable. He states that he was feeling a bit fatigued today. He was sitting in a chair and started feeling dizzy. He states he felt lightheaded but he also had the sensation that the room was spinning. He got nauseated. He felt like his heart was beating hard. He states when he stood up he collapsed to the ground. He denies any injuries. He did not actually pass out. He remembers all events. He states when he was on the floor he felt a little tremulous but had no jerking movements. He also felt like his heart was beating fast and he felt a little short of breath at that point. He currently feels back to baseline. He has no dizziness. He has a very slight headache. He did not have a headache during the event. He denies any chest pain or current shortness of breath. He states he's been drinking well but hasn't been eating a lot. He denies any recent illnesses. No fevers cough or cold symptoms. He denies abdominal pain. He denies any urinary symptoms. Denies any history of similar events in the past.  Patient is a 35 y.o. male presenting with syncope.  Loss of Consciousness Associated symptoms: dizziness, nausea and palpitations   Associated symptoms: no chest pain, no diaphoresis, no fever, no headaches, no vomiting and no weakness     Past Medical History  Diagnosis Date  . Hypertension   . High cholesterol   . HIV infection (HCC)   . Asthma    Past Surgical History  Procedure Laterality Date  . Left heart catheterization with coronary angiogram N/A  06/30/2014    Procedure: LEFT HEART CATHETERIZATION WITH CORONARY ANGIOGRAM;  Surgeon: [name], MD;  Location: [facility] CATH LAB;  Service: Cardiovascular;  Laterality: N/A;   Family History  Problem Relation Age of Onset  . Hypertension Other   . Hyperlipidemia Other   . Diabetes Other   . Hypertension Mother   . Diabetes Father   . Hyperlipidemia Father   . Hypertension Father    Social History  Substance Use Topics  . Smoking status: Former Smoker -- 6 years    Quit date: 10/20/2010  . Smokeless tobacco: Never Used     Comment: smoked about 2 packs per week (when used to smoke)  . Alcohol Use: 1.8 oz/week    3 Standard drinks or equivalent per week     Comment: Occasional    Review of Systems  Constitutional: Positive for fatigue. Negative for fever, chills and diaphoresis.  HENT: Negative for congestion, rhinorrhea and sneezing.   Eyes: Negative.   Respiratory: Negative for cough and chest tightness.   Cardiovascular: Positive for palpitations and syncope. Negative for chest pain and leg swelling.  Gastrointestinal: Positive for nausea. Negative for vomiting, abdominal pain, diarrhea and blood in stool.  Genitourinary: Negative for frequency, hematuria, flank pain and difficulty urinating.  Musculoskeletal: Negative for back pain and arthralgias.  Skin: Negative for rash.  Neurological: Positive for dizziness and light-headedness. Negative for speech difficulty, weakness, numbness and headaches.      Allergies  Review of  patient's allergies indicates no known allergies.  Home Medications   Prior to Admission medications   Medication Sig Start Date End Date Taking? Authorizing Provider  albuterol (PROVENTIL HFA;VENTOLIN HFA) 108 (90 BASE) MCG/ACT inhaler Inhale 2 puffs into the lungs every 6 (six) hours as needed for wheezing or shortness of breath.   Yes Historical Provider, MD  allopurinol (ZYLOPRIM) 100 MG tablet Take 100 mg by mouth daily. 10/06/15  Yes  Historical Provider, MD  amLODipine (NORVASC) 10 MG tablet Take 5 mg by mouth daily.  04/01/14  Yes Historical Provider, MD  aspirin EC 325 MG EC tablet Take 1 tablet (325 mg total) by mouth daily. 04/11/14  Yes Elease Etienne, MD  efavirenz-emtricitabine-tenofovir (ATRIPLA) 600-200-300 MG per tablet Take 1 tablet by mouth at bedtime.    Yes Historical Provider, MD  enalapril (VASOTEC) 20 MG tablet Take 1 tablet (20 mg total) by mouth daily. 08/04/14  Yes [name], MD  furosemide (LASIX) 20 MG tablet TAKE ONE TABLET BY MOUTH ONCE DAILY 03/02/15  Yes [name], MD  guanFACINE (TENEX) 1 MG tablet Take 1 mg by mouth at bedtime.  05/29/14  Yes Historical Provider, MD  ibuprofen (ADVIL,MOTRIN) 200 MG tablet Take 400 mg by mouth every 6 (six) hours as needed for pain.   Yes Historical Provider, MD  losartan (COZAAR) 100 MG tablet Take 100 mg by mouth daily. 10/19/15  Yes Historical Provider, MD  metoprolol succinate (TOPROL-XL) 100 MG 24 hr tablet Take 100 mg by mouth daily. Take with or immediately following a meal.   Yes Historical Provider, MD  omeprazole (PRILOSEC) 20 MG capsule Take 1 capsule (20 mg total) by mouth daily. Patient taking differently: Take 20 mg by mouth daily as needed (heartburn/acid reflux).  09/08/13  Yes Shari Upstill, PA-C  simvastatin (ZOCOR) 20 MG tablet Take 1 tablet (20 mg total) by mouth daily at 6 PM. 04/11/14  Yes Elease Etienne, MD   BP 138/97 mmHg  Pulse 89  Temp(Src) 98.6 F (37 C) (Oral)  Resp 22  SpO2 95% Physical Exam  Constitutional: He is oriented to person, place, and time. He appears well-developed and well-nourished.  HENT:  Head: Normocephalic and atraumatic.  Eyes: Pupils are equal, round, and reactive to light.  Neck: Normal range of motion. Neck supple.  Cardiovascular: Normal rate, regular rhythm and normal heart sounds.   Pulmonary/Chest: Effort normal and breath sounds normal. No respiratory distress. He has no wheezes. He has no  rales. He exhibits no tenderness.  Abdominal: Soft. Bowel sounds are normal. There is no tenderness. There is no rebound and no guarding.  Musculoskeletal: Normal range of motion. He exhibits no edema.  Lymphadenopathy:    He has no cervical adenopathy.  Neurological: He is alert and oriented to person, place, and time. He has normal strength. No cranial nerve deficit or sensory deficit. GCS eye subscore is 4. GCS verbal subscore is 5. GCS motor subscore is 6.  FTN intact  Skin: Skin is warm and dry. No rash noted.  Psychiatric: He has a normal mood and affect.    ED Course  Procedures (including critical care time) Labs Review Labs Reviewed  BASIC METABOLIC PANEL - Abnormal; Notable for the following:    Glucose, Bld 146 (*)    All other components within normal limits  CBC - Abnormal; Notable for the following:    MCV 102.5 (*)    MCH 35.1 (*)    Platelets 147 (*)  All other components within normal limits  URINALYSIS, ROUTINE W REFLEX MICROSCOPIC (NOT AT [facility]) - Abnormal; Notable for the following:    Ketones, ur 15 (*)    All other components within normal limits  CBG MONITORING, ED - Abnormal; Notable for the following:    Glucose-Capillary 193 (*)    All other components within normal limits  CBG MONITORING, ED    Imaging Review No results found. I have personally reviewed and evaluated these images and lab results as part of my medical decision-making.   EKG Interpretation   Date/Time:  Tuesday October 27 2015 13:43:33 EST Ventricular Rate:  81 PR Interval:  145 QRS Duration: 96 QT Interval:  375 QTC Calculation: 435 R Axis:   -11 Text Interpretation:  Sinus rhythm Probable left atrial enlargement Left  ventricular hypertrophy Borderline T abnormalities, inferior leads similar  to 04/10/14 Confirmed by FLOYD MD, Reuel Boom [phone]) on 10/27/2015 1:49:56 PM      MDM   Final diagnoses:  Near syncope  Palpitations    Patient presents with a near-syncopal  episode. It was preceded by dizziness. He also had associated palpitations. He is completely back to baseline now. There is no evidence of arrhythmia on EKG. There is no evidence of ischemia. He is neurologically intact without suggestions of strokelike symptoms. He has no evidence of infectious illness. His labs are grossly normal. He was given a bolus of IV fluids. He's ambulating without symptoms. He was discharged home in good condition. I did advise him to have close follow-up with his PCP. I advised him that he may want to have a Holter monitor placed given the palpitations. He was given return precautions if symptoms recur.    Rolan Bucco, MD 10/27/15 [phone]

## 2015-10-27 NOTE — ED Notes (Signed)
Ambulated to restroom no difficulty

## 2015-11-02 ENCOUNTER — Telehealth: Payer: Self-pay | Admitting: Internal Medicine

## 2015-11-02 NOTE — Telephone Encounter (Signed)
Received records from [facility] for appointment with Dr Rennis Golden on 11/18/15.  Records given to [facility] (medical records) for Dr Blanchie Dessert schedule on 11/18/15. lp

## 2015-11-18 ENCOUNTER — Ambulatory Visit (INDEPENDENT_AMBULATORY_CARE_PROVIDER_SITE_OTHER): Payer: BLUE CROSS/BLUE SHIELD | Admitting: Internal Medicine

## 2015-11-18 ENCOUNTER — Encounter: Payer: Self-pay | Admitting: Internal Medicine

## 2015-11-18 VITALS — BP 120/84 | HR 100 | Ht 71.0 in | Wt 202.2 lb

## 2015-11-18 DIAGNOSIS — I428 Other cardiomyopathies: Secondary | ICD-10-CM

## 2015-11-18 DIAGNOSIS — I10 Essential (primary) hypertension: Secondary | ICD-10-CM | POA: Diagnosis not present

## 2015-11-18 DIAGNOSIS — R0609 Other forms of dyspnea: Secondary | ICD-10-CM | POA: Diagnosis not present

## 2015-11-18 DIAGNOSIS — R931 Abnormal findings on diagnostic imaging of heart and coronary circulation: Secondary | ICD-10-CM

## 2015-11-18 DIAGNOSIS — R55 Syncope and collapse: Secondary | ICD-10-CM | POA: Diagnosis not present

## 2015-11-18 DIAGNOSIS — B20 Human immunodeficiency virus [HIV] disease: Secondary | ICD-10-CM

## 2015-11-18 DIAGNOSIS — G4733 Obstructive sleep apnea (adult) (pediatric): Secondary | ICD-10-CM | POA: Diagnosis not present

## 2015-11-18 DIAGNOSIS — Z21 Asymptomatic human immunodeficiency virus [HIV] infection status: Secondary | ICD-10-CM | POA: Diagnosis not present

## 2015-11-18 DIAGNOSIS — I429 Cardiomyopathy, unspecified: Secondary | ICD-10-CM

## 2015-11-18 NOTE — Progress Notes (Signed)
OFFICE NOTE  Chief Complaint:  Pre-syncope  Primary Care Physician: Georgianne Fick, MD  HPI:  Justin Cox is a pleasant 35 year old male with past medical history 7 for long-standing hypertension, dyslipidemia, 2 recent TIAs, and HIV disease on medication with an undetectable viral load. He recently has been in the hospital for a second TIA. He had medication adjustments. An echocardiogram was performed which demonstrated a low normal EF of 50-55% with possible posterior basal wall motion abnormality. He is described increased frequency of chest pressure and shortness of breath with exertion over the past several weeks. 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 He does not exercise and is fairly sedentary when he is not working. There is no significant family history of heart disease however hypertension is somewhat rampant in the family.  Justin Cox returns today for followup. He underwent a nuclear stress test which showed a reduced EF of 33% with global hypokinesis and some mild reversible ischemia although was read as low risk. The EF does contradict the findings by echocardiography, suggestive of possible multivessel coronary disease. He continues to have episodes of chest discomfort more associated with exertion.  He was referred for definitive cardiac catheterization by myself, with the following results:  Hemodynamics: Central Aortic Pressure / Mean Aortic Pressure: 114/83 LV Pressure / LV End diastolic Pressure: 19  Left Ventriculography: EF: 35-40% Wall Motion: global hypokinesis with basal inferior severe hypokinesis MR: 1+  Coronary Angiographic Data:  Left Main: Normal  Left Anterior Descending (LAD): Large caliber vessel, angiographically normal, wraps around the apex  1st diagonal (D1): Large D1 without disease  2nd diagonal (D2): Smaller mid-distal vessel  Circumflex  (LCx): Dominant, large caliber vessel without stenosis  1st obtuse marginal: Large branch, no disease  2nd obtuse marginal: Large branch, no disease 3rd obtuse marginal: Large branch, no disease  posterior lateral branch: Patent, no stenosis  Right Coronary Artery: Smaller vessel, non-dominant, no stenosis  Impression: 1. No angiographically significant coronary artery disease 2. LVEF 35-40% with global hypokinesis and more severe basal inferior hypokinesis 3. LVEDP = 19 mmHg  Plan: 1. This is likely a non-ischemic cardiomyopathy, possibly from HTN in the past or HIV disease/anti-retroviral medications 2. Will start lasix 20 mg daily for elevated LVEDP of 19 mmHg, with normal systemic blood pressure 3. Follow-up with me in 2-3 weeks. Will further optimize heart failure medications at that time.  Justin Cox returns today and is reportedly feeling much better. He is now on Lasix 20 mg daily. Blood pressure is pretty well controlled however there is room to further optimize his blood pressure and heart failure medications.  I had the pleasure see Justin Cox back today in the office. Unfortunately he recently had a presyncopal episode. He felt like his heart may been racing during that episode. He's been under a lot of stress at work. He followed up with his primary care provider after being seen in the emergency department. Workup there was negative. He was placed on some lorazepam for anxiety. He denies any chest pain or worsening shortness of breath. Weight has been stable. He subsequently been changed from ramipril to losartan as he developed ACE inhibitor cough.  PMHx:  Past Medical History  Diagnosis Date  . Hypertension   . High cholesterol   . HIV infection (HCC)   . Asthma     Past Surgical History  Procedure Laterality Date  . Left heart catheterization with coronary angiogram N/A 06/30/2014  Procedure: LEFT HEART CATHETERIZATION WITH CORONARY ANGIOGRAM;   Surgeon: [name], MD;  Location: [facility] CATH LAB;  Service: Cardiovascular;  Laterality: N/A;    FAMHx:  Family History  Problem Relation Age of Onset  . Hypertension Other   . Hyperlipidemia Other   . Diabetes Other   . Hypertension Mother   . Diabetes Father   . Hyperlipidemia Father   . Hypertension Father     SOCHx:   reports that he quit smoking about 5 years ago. He has never used smokeless tobacco. He reports that he drinks about 1.8 oz of alcohol per week. He reports that he does not use illicit drugs.  ALLERGIES:  Allergies  Allergen Reactions  . Enalapril Cough    ROS: Pertinent items noted in HPI and remainder of comprehensive ROS otherwise negative.  HOME MEDS: Current Outpatient Prescriptions  Medication Sig Dispense Refill  . albuterol (PROVENTIL HFA;VENTOLIN HFA) 108 (90 BASE) MCG/ACT inhaler Inhale 2 puffs into the lungs every 6 (six) hours as needed for wheezing or shortness of breath.    . allopurinol (ZYLOPRIM) 100 MG tablet Take 100 mg by mouth daily.    Marland Kitchen amLODipine (NORVASC) 10 MG tablet Take 5 mg by mouth daily.     Marland Kitchen aspirin EC 325 MG EC tablet Take 1 tablet (325 mg total) by mouth daily. 30 tablet 0  . efavirenz-emtricitabine-tenofovir (ATRIPLA) 600-200-300 MG per tablet Take 1 tablet by mouth at bedtime.     . furosemide (LASIX) 20 MG tablet TAKE ONE TABLET BY MOUTH ONCE DAILY 30 tablet 6  . ibuprofen (ADVIL,MOTRIN) 200 MG tablet Take 400 mg by mouth every 6 (six) hours as needed for pain.    Marland Kitchen losartan (COZAAR) 100 MG tablet Take 100 mg by mouth daily.    . metoprolol succinate (TOPROL-XL) 100 MG 24 hr tablet Take 100 mg by mouth daily. Take with or immediately following a meal.    . omeprazole (PRILOSEC) 20 MG capsule Take 1 capsule (20 mg total) by mouth daily. (Patient taking differently: Take 20 mg by mouth daily as needed (heartburn/acid reflux). ) 15 capsule 0  . simvastatin (ZOCOR) 20 MG tablet Take 1 tablet (20 mg total) by mouth daily  at 6 PM. 30 tablet 0  . guanFACINE (TENEX) 2 MG tablet Take 1 tablet by mouth daily. Take 1 tab by mouth daily    . LORazepam (ATIVAN) 0.5 MG tablet Take 1 tablet by mouth daily. Take 1 tab by mouth daily     No current facility-administered medications for this visit.    LABS/IMAGING: No results found for this or any previous visit (from the past 48 hour(s)). No results found.  VITALS: BP 120/84 mmHg  Pulse 100  Ht 5\' 11"  (1.803 m)  Wt 202 lb 4 oz (91.74 kg)  BMI 28.22 kg/m2  EXAM: General appearance: alert and no distress Neck: no carotid bruit, no JVD and thyroid not enlarged, symmetric, no tenderness/mass/nodules Lungs: clear to auscultation bilaterally Heart: regular rate and rhythm, S1, S2 normal, no murmur, click, rub or gallop Abdomen: soft, non-tender; bowel sounds normal; no masses,  no organomegaly Extremities: extremities normal, atraumatic, no cyanosis or edema Pulses: 2+ and symmetric Skin: Skin color, texture, turgor normal. No rashes or lesions Neurologic: Grossly normal Psych: Pleasant  EKG: Normal sinus rhythm at 100, LVH, nonspecific T-wave changes  ASSESSMENT: 1. Nonischemic cardiomyopathy, New York Heart Association class II symptoms, EF 35-40% 2. Normal coronary arteries by recent heart catheterization 3. Long-standing hypertension  4. Dyslipidemia 5. Recent TIA x2 6. HIV disease on appropriate medication, undetectable viral load 7. Pre-syncope-? arrhythmia  PLAN: 1.   Mr. Moffatt has been having some intermittent palpitations and presyncope which may be due to arrhythmia, especially given his reduced LV function. I like to place a 30 day monitor to see if we can pick up any abnormal rhythms. He remains tachycardic despite being on high dose Toprol. Options could be increasing his Toprol or considering adding Corlanor. I will review his monitor including heart rate trends and if he shows persistent tachycardia, medication changes may be helpful. Blood  pressure is controlled at this point. He appears euvolemic with regards to his heart failure on his current dose of Lasix. He has been changed from ACE inhibitor over to ARB due to cough.  Follow-up in one month.  [name], MD, [facility] Attending Cardiologist [facility]  [name] 11/18/2015, 3:23 PM

## 2015-11-18 NOTE — Patient Instructions (Addendum)
Your physician has recommended that you wear an event monitor for 30 days. Event monitors are medical devices that record the heart's electrical activity. Doctors most often [place] these monitors to diagnose arrhythmias. Arrhythmias are problems with the speed or rhythm of the heartbeat. The monitor is a small, portable device. You can wear one while you do your normal daily activities. This is usually used to diagnose what is causing palpitations/syncope (passing out). -- please schedule a nurse visit to have the monitor placed -- pre-syncope, non-ischemic cardiomyopathy  Your physician recommends that you schedule a follow-up appointment after your monitor.

## 2015-11-19 ENCOUNTER — Other Ambulatory Visit: Payer: Self-pay | Admitting: Internal Medicine

## 2015-11-19 NOTE — Telephone Encounter (Signed)
REFILL 

## 2015-11-20 ENCOUNTER — Telehealth: Payer: Self-pay | Admitting: Internal Medicine

## 2015-11-20 DIAGNOSIS — R55 Syncope and collapse: Secondary | ICD-10-CM

## 2015-11-20 DIAGNOSIS — I428 Other cardiomyopathies: Secondary | ICD-10-CM

## 2015-11-20 NOTE — Telephone Encounter (Signed)
Returned patient's call.  Left voice mail to call office back

## 2015-11-20 NOTE — Telephone Encounter (Signed)
Spoke to patient   will contact him when monitor is present to apply. Verbalized understanding

## 2015-11-20 NOTE — Telephone Encounter (Signed)
Left message - office will contact you next week when a monitor is available Call either Monday or Tuesday

## 2015-11-20 NOTE — Telephone Encounter (Signed)
New Message  Pt calling to speak w/ RN to see if there are heart monitors for his appt @ 9am. Please call back and discuss.

## 2015-11-23 ENCOUNTER — Other Ambulatory Visit: Payer: Self-pay | Admitting: *Deleted

## 2015-11-23 NOTE — Addendum Note (Signed)
Addended by: Lindell Spar on: 11/23/2015 08:35 AM   Modules accepted: Orders

## 2015-11-23 NOTE — Telephone Encounter (Signed)
LM for patient that the [facility] office does not have any event monitors and that an order will be placed for him to have a 30 day event monitor placed at our [facility]. Order placed. Message sent to schedulers to arrange appt and contact patient.

## 2015-11-26 ENCOUNTER — Ambulatory Visit (INDEPENDENT_AMBULATORY_CARE_PROVIDER_SITE_OTHER): Payer: BLUE CROSS/BLUE SHIELD

## 2015-11-26 DIAGNOSIS — R55 Syncope and collapse: Secondary | ICD-10-CM | POA: Diagnosis not present

## 2015-11-26 DIAGNOSIS — I429 Cardiomyopathy, unspecified: Secondary | ICD-10-CM | POA: Diagnosis not present

## 2015-12-24 ENCOUNTER — Ambulatory Visit: Payer: BLUE CROSS/BLUE SHIELD | Admitting: Internal Medicine

## 2015-12-24 ENCOUNTER — Other Ambulatory Visit: Payer: Self-pay | Admitting: Internal Medicine

## 2015-12-24 NOTE — Telephone Encounter (Signed)
Rx refill sent to pharmacy. 

## 2015-12-30 ENCOUNTER — Ambulatory Visit: Payer: BLUE CROSS/BLUE SHIELD | Admitting: Internal Medicine

## 2016-01-06 ENCOUNTER — Encounter: Payer: Self-pay | Admitting: Internal Medicine

## 2016-01-06 ENCOUNTER — Ambulatory Visit (INDEPENDENT_AMBULATORY_CARE_PROVIDER_SITE_OTHER): Payer: BLUE CROSS/BLUE SHIELD | Admitting: Internal Medicine

## 2016-01-06 VITALS — BP 156/104 | HR 86 | Ht 71.0 in | Wt 198.6 lb

## 2016-01-06 DIAGNOSIS — Z21 Asymptomatic human immunodeficiency virus [HIV] infection status: Secondary | ICD-10-CM | POA: Diagnosis not present

## 2016-01-06 DIAGNOSIS — I428 Other cardiomyopathies: Secondary | ICD-10-CM

## 2016-01-06 DIAGNOSIS — I160 Hypertensive urgency: Secondary | ICD-10-CM

## 2016-01-06 DIAGNOSIS — I493 Ventricular premature depolarization: Secondary | ICD-10-CM

## 2016-01-06 DIAGNOSIS — E785 Hyperlipidemia, unspecified: Secondary | ICD-10-CM

## 2016-01-06 DIAGNOSIS — B20 Human immunodeficiency virus [HIV] disease: Secondary | ICD-10-CM

## 2016-01-06 DIAGNOSIS — I429 Cardiomyopathy, unspecified: Secondary | ICD-10-CM

## 2016-01-06 MED ORDER — FUROSEMIDE 20 MG PO TABS: 20.0000 mg | ORAL_TABLET | Freq: Every day | ORAL | Status: DC

## 2016-01-06 MED ORDER — OLMESARTAN MEDOXOMIL 40 MG PO TABS: 40.0000 mg | ORAL_TABLET | Freq: Every day | ORAL | Status: DC

## 2016-01-06 NOTE — Patient Instructions (Signed)
Your physician recommends that you schedule a follow-up appointment in ONE MONTH with Dr. Rennis Golden  Your physician has recommended you make the following change in your medication: STOP losartan. START olmesartan (benicar) 40mg  once daily for blood pressure.   Your lasix has been refilled.

## 2016-01-06 NOTE — Progress Notes (Signed)
OFFICE NOTE  Chief Complaint:  Follow-up monitor  Primary Care Physician: Georgianne Fick, MD  HPI:  Justin Cox is a pleasant 35 year old male with past medical history 7 for long-standing hypertension, dyslipidemia, 2 recent TIAs, and HIV disease on medication with an undetectable viral load. He recently has been in the hospital for a second TIA. He had medication adjustments. An echocardiogram was performed which demonstrated a low normal EF of 50-55% with possible posterior basal wall motion abnormality. He is described increased frequency of chest pressure and shortness of breath with exertion over the past several weeks. 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 He does not exercise and is fairly sedentary when he is not working. There is no significant family history of heart disease however hypertension is somewhat rampant in the family.  Justin Cox returns today for followup. He underwent a nuclear stress test which showed a reduced EF of 33% with global hypokinesis and some mild reversible ischemia although was read as low risk. The EF does contradict the findings by echocardiography, suggestive of possible multivessel coronary disease. He continues to have episodes of chest discomfort more associated with exertion.  He was referred for definitive cardiac catheterization by myself, with the following results:  Hemodynamics: Central Aortic Pressure / Mean Aortic Pressure: 114/83 LV Pressure / LV End diastolic Pressure: 19  Left Ventriculography: EF: 35-40% Wall Motion: global hypokinesis with basal inferior severe hypokinesis MR: 1+  Coronary Angiographic Data:  Left Main: Normal  Left Anterior Descending (LAD): Large caliber vessel, angiographically normal, wraps around the apex  1st diagonal (D1): Large D1 without disease  2nd diagonal (D2): Smaller mid-distal  vessel  Circumflex (LCx): Dominant, large caliber vessel without stenosis  1st obtuse marginal: Large branch, no disease  2nd obtuse marginal: Large branch, no disease 3rd obtuse marginal: Large branch, no disease  posterior lateral branch: Patent, no stenosis  Right Coronary Artery: Smaller vessel, non-dominant, no stenosis  Impression: 1. No angiographically significant coronary artery disease 2. LVEF 35-40% with global hypokinesis and more severe basal inferior hypokinesis 3. LVEDP = 19 mmHg  Plan: 1. This is likely a non-ischemic cardiomyopathy, possibly from HTN in the past or HIV disease/anti-retroviral medications 2. Will start lasix 20 mg daily for elevated LVEDP of 19 mmHg, with normal systemic blood pressure 3. Follow-up with me in 2-3 weeks. Will further optimize heart failure medications at that time.  Justin Cox returns today and is reportedly feeling much better. He is now on Lasix 20 mg daily. Blood pressure is pretty well controlled however there is room to further optimize his blood pressure and heart failure medications.  I had the pleasure see Justin Cox back today in the office. Unfortunately he recently had a presyncopal episode. He felt like his heart may been racing during that episode. He's been under a lot of stress at work. He followed up with his primary care provider after being seen in the emergency department. Workup there was negative. He was placed on some lorazepam for anxiety. He denies any chest pain or worsening shortness of breath. Weight has been stable. He subsequently been changed from ramipril to losartan as he developed ACE inhibitor cough.  Justin Cox returns today for follow-up of his monitor. He was noting increased palpitations. His monitor showed normal sinus rhythm and sinus tachycardia with occasional PVCs. He notes that he recently ran out of his Lasix. He has had some mild increase in shortness of breath. Blood pressure is  elevated today at 156/104. He reports his blood pressures at home are more like 135/100.  PMHx:  Past Medical History  Diagnosis Date  . Hypertension   . High cholesterol   . HIV infection (HCC)   . Asthma     Past Surgical History  Procedure Laterality Date  . Left heart catheterization with coronary angiogram N/A 06/30/2014    Procedure: LEFT HEART CATHETERIZATION WITH CORONARY ANGIOGRAM;  Surgeon: [name], MD;  Location: [facility] CATH LAB;  Service: Cardiovascular;  Laterality: N/A;    FAMHx:  Family History  Problem Relation Age of Onset  . Hypertension Other   . Hyperlipidemia Other   . Diabetes Other   . Hypertension Mother   . Diabetes Father   . Hyperlipidemia Father   . Hypertension Father     SOCHx:   reports that he quit smoking about 5 years ago. He has never used smokeless tobacco. He reports that he drinks about 1.8 oz of alcohol per week. He reports that he does not use illicit drugs.  ALLERGIES:  Allergies  Allergen Reactions  . Enalapril Cough    ROS: Pertinent items noted in HPI and remainder of comprehensive ROS otherwise negative.  HOME MEDS: Current Outpatient Prescriptions  Medication Sig Dispense Refill  . albuterol (PROVENTIL HFA;VENTOLIN HFA) 108 (90 BASE) MCG/ACT inhaler Inhale 2 puffs into the lungs every 6 (six) hours as needed for wheezing or shortness of breath.    . allopurinol (ZYLOPRIM) 100 MG tablet Take 100 mg by mouth daily.    Marland Kitchen amLODipine (NORVASC) 10 MG tablet Take 5 mg by mouth daily.     Marland Kitchen aspirin EC 325 MG EC tablet Take 1 tablet (325 mg total) by mouth daily. 30 tablet 0  . efavirenz-emtricitabine-tenofovir (ATRIPLA) 600-200-300 MG per tablet Take 1 tablet by mouth at bedtime.     . furosemide (LASIX) 20 MG tablet Take 1 tablet (20 mg total) by mouth daily. 30 tablet 5  . guanFACINE (TENEX) 2 MG tablet Take 1 tablet by mouth daily. Take 1 tab by mouth daily    . ibuprofen (ADVIL,MOTRIN) 200 MG tablet Take 400 mg by mouth  every 6 (six) hours as needed for pain.    Marland Kitchen LORazepam (ATIVAN) 0.5 MG tablet Take 1 tablet by mouth daily. Take 1 tab by mouth daily    . metoprolol succinate (TOPROL-XL) 100 MG 24 hr tablet Take 100 mg by mouth daily. Take with or immediately following a meal.    . omeprazole (PRILOSEC) 20 MG capsule Take 1 capsule (20 mg total) by mouth daily. (Patient taking differently: Take 20 mg by mouth daily as needed (heartburn/acid reflux). ) 15 capsule 0  . simvastatin (ZOCOR) 20 MG tablet Take 1 tablet (20 mg total) by mouth daily at 6 PM. 30 tablet 0  . olmesartan (BENICAR) 40 MG tablet Take 1 tablet (40 mg total) by mouth daily. 30 tablet 5   No current facility-administered medications for this visit.    LABS/IMAGING: No results found for this or any previous visit (from the past 48 hour(s)). No results found.  VITALS: BP 156/104 mmHg  Pulse 86  Ht 5\' 11"  (1.803 m)  Wt 198 lb 9.6 oz (90.084 kg)  BMI 27.71 kg/m2  EXAM: Deferred  EKG: Deferred  ASSESSMENT: 1. Nonischemic cardiomyopathy, New York Heart Association class II symptoms, EF 35-40% 2. Normal coronary arteries by recent heart catheterization 3. Long-standing hypertension 4. Dyslipidemia 5. Recent TIA x2 6. HIV disease on appropriate medication,  undetectable viral load 7. PVC's  PLAN: 1.   Justin Cox had sinus rhythm and sinus tachycardia with PVCs on his monitor. He reports his palpitations have improved somewhat. I do think stress plays a big role in this. I'm concerned about medication compliance. He recently ran out of Lasix. Will prescribe that again today. Blood pressure still not optimally controlled. I like to change his losartan over to Benicar 40 mg daily. Follow-up with me in one month for blood pressure check.  [name], MD, [facility] Attending Cardiologist [facility]  [name] 01/06/2016, 9:33 AM

## 2016-02-05 ENCOUNTER — Ambulatory Visit (INDEPENDENT_AMBULATORY_CARE_PROVIDER_SITE_OTHER): Payer: BLUE CROSS/BLUE SHIELD | Admitting: Internal Medicine

## 2016-02-05 ENCOUNTER — Encounter: Payer: Self-pay | Admitting: Internal Medicine

## 2016-02-05 VITALS — BP 140/98 | HR 86 | Ht 71.0 in | Wt 195.6 lb

## 2016-02-05 DIAGNOSIS — I429 Cardiomyopathy, unspecified: Secondary | ICD-10-CM | POA: Diagnosis not present

## 2016-02-05 DIAGNOSIS — I428 Other cardiomyopathies: Secondary | ICD-10-CM

## 2016-02-05 DIAGNOSIS — G459 Transient cerebral ischemic attack, unspecified: Secondary | ICD-10-CM

## 2016-02-05 DIAGNOSIS — I160 Hypertensive urgency: Secondary | ICD-10-CM | POA: Diagnosis not present

## 2016-02-05 NOTE — Patient Instructions (Addendum)
Medication Instructions:  Increase Amlodipine 7.5 mg(1 1/2 tablet) daily  Labwork: NONE  Testing/Procedures: NONE  Follow-Up: Your physician recommends that you schedule a follow-up appointment in: 2 Weeks with Baxter Hire or Tresa Endo for Blood Pressure check   Any Other Special Instructions Will Be Listed Below (If Applicable).   If you need a refill on your cardiac medications before your next appointment, please call your pharmacy.  Follow-up in 6 months with Dr. Rennis Golden

## 2016-02-05 NOTE — Progress Notes (Signed)
OFFICE NOTE  Chief Complaint:  Follow-up blood pressure  Primary Care Physician: Georgianne Fick, MD  HPI:  Justin Cox is a pleasant 35 year old male with past medical history 7 for long-standing hypertension, dyslipidemia, 2 recent TIAs, and HIV disease on medication with an undetectable viral load. He recently has been in the hospital for a second TIA. He had medication adjustments. An echocardiogram was performed which demonstrated a low normal EF of 50-55% with possible posterior basal wall motion abnormality. He is described increased frequency of chest pressure and shortness of breath with exertion over the past several weeks. 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 He does not exercise and is fairly sedentary when he is not working. There is no significant family history of heart disease however hypertension is somewhat rampant in the family.  [name] returns today for followup. He underwent a nuclear stress test which showed a reduced EF of 33% with global hypokinesis and some mild reversible ischemia although was read as low risk. The EF does contradict the findings by echocardiography, suggestive of possible multivessel coronary disease. He continues to have episodes of chest discomfort more associated with exertion.  He was referred for definitive cardiac catheterization by myself, with the following results:  Hemodynamics: Central Aortic Pressure / Mean Aortic Pressure: 114/83 LV Pressure / LV End diastolic Pressure: 19  Left Ventriculography: EF: 35-40% Wall Motion: global hypokinesis with basal inferior severe hypokinesis MR: 1+  Coronary Angiographic Data:  Left Main: Normal  Left Anterior Descending (LAD): Large caliber vessel, angiographically normal, wraps around the apex  1st diagonal (D1): Large D1 without disease  2nd diagonal (D2): Smaller mid-distal  vessel  Circumflex (LCx): Dominant, large caliber vessel without stenosis  1st obtuse marginal: Large branch, no disease  2nd obtuse marginal: Large branch, no disease 3rd obtuse marginal: Large branch, no disease  posterior lateral branch: Patent, no stenosis  Right Coronary Artery: Smaller vessel, non-dominant, no stenosis  Impression: 1. No angiographically significant coronary artery disease 2. LVEF 35-40% with global hypokinesis and more severe basal inferior hypokinesis 3. LVEDP = 19 mmHg  Plan: 1. This is likely a non-ischemic cardiomyopathy, possibly from HTN in the past or HIV disease/anti-retroviral medications 2. Will start lasix 20 mg daily for elevated LVEDP of 19 mmHg, with normal systemic blood pressure 3. Follow-up with me in 2-3 weeks. Will further optimize heart failure medications at that time.  [name] returns today and is reportedly feeling much better. He is now on Lasix 20 mg daily. Blood pressure is pretty well controlled however there is room to further optimize his blood pressure and heart failure medications.  I had the pleasure see Mr. Sitter back today in the office. Unfortunately he recently had a presyncopal episode. He felt like his heart may been racing during that episode. He's been under a lot of stress at work. He followed up with his primary care provider after being seen in the emergency department. Workup there was negative. He was placed on some lorazepam for anxiety. He denies any chest pain or worsening shortness of breath. Weight has been stable. He subsequently been changed from ramipril to losartan as he developed ACE inhibitor cough.  [name] returns today for follow-up of his monitor. He was noting increased palpitations. His monitor showed normal sinus rhythm and sinus tachycardia with occasional PVCs. He notes that he recently ran out of his Lasix. He has had some mild increase in shortness of breath. Blood pressure is  elevated today at 156/104. He reports his blood pressures at home are more like 135/100.  02/05/2016  [name] returns today for follow-up of blood pressure. He was noncompliant with his medications and blood pressure was elevated. I changed his losartan over to Benicar 40 mg daily. This seems to work well however blood pressure still elevated 140/98. He is getting 150 systolic at home. He reports his stress at work is gotten a little bit better. He is more compliant with Lasix. He will need additional blood pressure control.  PMHx:  Past Medical History  Diagnosis Date  . Hypertension   . High cholesterol   . HIV infection (HCC)   . Asthma     Past Surgical History  Procedure Laterality Date  . Left heart catheterization with coronary angiogram N/A 06/30/2014    Procedure: LEFT HEART CATHETERIZATION WITH CORONARY ANGIOGRAM;  Surgeon: [name], MD;  Location: [facility] CATH LAB;  Service: Cardiovascular;  Laterality: N/A;    FAMHx:  Family History  Problem Relation Age of Onset  . Hypertension Other   . Hyperlipidemia Other   . Diabetes Other   . Hypertension Mother   . Diabetes Father   . Hyperlipidemia Father   . Hypertension Father     SOCHx:   reports that he quit smoking about 5 years ago. He has never used smokeless tobacco. He reports that he drinks about 1.8 oz of alcohol per week. He reports that he does not use illicit drugs.  ALLERGIES:  Allergies  Allergen Reactions  . Enalapril Cough    ROS: Pertinent items noted in HPI and remainder of comprehensive ROS otherwise negative.  HOME MEDS: Current Outpatient Prescriptions  Medication Sig Dispense Refill  . albuterol (PROVENTIL HFA;VENTOLIN HFA) 108 (90 BASE) MCG/ACT inhaler Inhale 2 puffs into the lungs every 6 (six) hours as needed for wheezing or shortness of breath.    . allopurinol (ZYLOPRIM) 100 MG tablet Take 100 mg by mouth daily.    Marland Kitchen amLODipine (NORVASC) 10 MG tablet Take 7.5 mg by mouth daily.      Marland Kitchen aspirin EC 325 MG EC tablet Take 1 tablet (325 mg total) by mouth daily. 30 tablet 0  . efavirenz-emtricitabine-tenofovir (ATRIPLA) 600-200-300 MG per tablet Take 1 tablet by mouth at bedtime.     . furosemide (LASIX) 20 MG tablet Take 1 tablet (20 mg total) by mouth daily. 30 tablet 5  . guanFACINE (TENEX) 2 MG tablet Take 1 tablet by mouth daily. Take 1 tab by mouth daily    . ibuprofen (ADVIL,MOTRIN) 200 MG tablet Take 400 mg by mouth every 6 (six) hours as needed for pain.    Marland Kitchen LORazepam (ATIVAN) 0.5 MG tablet Take 1 tablet by mouth daily. Take 1 tab by mouth daily    . metoprolol succinate (TOPROL-XL) 100 MG 24 hr tablet Take 100 mg by mouth daily. Take with or immediately following a meal.    . olmesartan (BENICAR) 40 MG tablet Take 1 tablet (40 mg total) by mouth daily. 30 tablet 5  . omeprazole (PRILOSEC) 20 MG capsule Take 1 capsule (20 mg total) by mouth daily. (Patient taking differently: Take 20 mg by mouth daily as needed (heartburn/acid reflux). ) 15 capsule 0  . simvastatin (ZOCOR) 20 MG tablet Take 1 tablet (20 mg total) by mouth daily at 6 PM. 30 tablet 0   No current facility-administered medications for this visit.    LABS/IMAGING: No results found for this or any previous visit (from  the past 48 hour(s)). No results found.  VITALS: BP 140/98 mmHg  Pulse 86  Ht 5\' 11"  (1.803 m)  Wt 195 lb 9.6 oz (88.724 kg)  BMI 27.29 kg/m2  EXAM: Deferred  EKG: Deferred  ASSESSMENT: 1. Nonischemic cardiomyopathy, New York Heart Association class II symptoms, EF 35-40% 2. Normal coronary arteries by recent heart catheterization 3. Long-standing hypertension 4. Dyslipidemia 5. Recent TIA x2 6. HIV disease on appropriate medication, undetectable viral load 7. PVC's  PLAN: 1.   Mr. Vanhook Is not at goal with blood pressure. I like to increase his amlodipine to 7.5 mg daily. We will plan a follow-up with Belenda Cruise or Nicholaus Bloom in the hypertension clinic in a couple of weeks to see  if blood pressure can be more optimized. Follow-up with me in 6 months.  [name], MD, [facility] Attending Cardiologist [facility]  [name] 02/05/2016, 9:37 AM

## 2016-02-25 ENCOUNTER — Ambulatory Visit (INDEPENDENT_AMBULATORY_CARE_PROVIDER_SITE_OTHER): Payer: BLUE CROSS/BLUE SHIELD | Admitting: Pharmacist Clinician (PhC)/ Clinical Pharmacy Specialist

## 2016-02-25 VITALS — BP 150/104 | HR 76 | Ht 71.0 in | Wt 193.2 lb

## 2016-02-25 DIAGNOSIS — I10 Essential (primary) hypertension: Secondary | ICD-10-CM | POA: Diagnosis not present

## 2016-02-25 MED ORDER — AMLODIPINE BESYLATE 10 MG PO TABS: 10.0000 mg | ORAL_TABLET | Freq: Every day | ORAL | Status: DC

## 2016-02-25 NOTE — Progress Notes (Signed)
02/25/2016 Modena Nunnery 1981/05/06 [id number]   HPI:  Justin Cox is a 35 y.o. male patient of Dr Rennis Golden, with a PMH below who presents today for hypertension clinic evaluation.  He has a long-standing history of hypertension, first being treated shortly after finishing high school.  He states compliance with his medications, and has been checking his blood pressure daily on a cuff that is 53-20 years old.    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 albuterol (PROVENTIL HFA;VENTOLIN HFA) 108 (90 BASE) MCG/ACT inhaler Inhale 2 puffs into the lungs every 6 (six) hours as needed for wheezing or shortness of breath.    . allopurinol (ZYLOPRIM) 100 MG tablet Take 100 mg by mouth daily.    Marland Kitchen amLODipine (NORVASC) 10 MG tablet Take 1 tablet (10 mg total) by mouth daily. 30 tablet 5  . aspirin EC 325 MG EC  tablet Take 1 tablet (325 mg total) by mouth daily. 30 tablet 0  . efavirenz-emtricitabine-tenofovir (ATRIPLA) 600-200-300 MG per tablet Take 1 tablet by mouth at bedtime.     . furosemide (LASIX) 20 MG tablet Take 1 tablet (20 mg total) by mouth daily. 30 tablet 5  . guanFACINE (TENEX) 2 MG tablet Take 1 tablet by mouth daily. Take 1 tab by mouth daily    . ibuprofen (ADVIL,MOTRIN) 200 MG tablet Take 400 mg by mouth every 6 (six) hours as needed for pain.    Marland Kitchen LORazepam (ATIVAN) 0.5 MG tablet Take 1 tablet by mouth daily. Take 1 tab by mouth daily    . metoprolol succinate (TOPROL-XL) 100 MG 24 hr tablet Take 100 mg by mouth daily. Take with or immediately following a meal.    . olmesartan (BENICAR) 40 MG tablet Take 1 tablet (40 mg total) by mouth daily. 30 tablet 5  . omeprazole (PRILOSEC) 20 MG capsule Take 1 capsule (20 mg total) by mouth daily. (Patient taking differently: Take 20 mg by mouth daily as needed (heartburn/acid reflux). ) 15 capsule 0  . simvastatin (ZOCOR) 20 MG tablet Take 1 tablet (20 mg total) by mouth daily at 6 PM. 30 tablet 0   No current facility-administered medications for this visit.    Allergies  Allergen Reactions  . Enalapril Cough  Past Medical History  Diagnosis Date  . Hypertension   . High cholesterol   . HIV infection (HCC)   . Asthma     Blood pressure 150/104, pulse 76, height  (1.803 m), weight 193 lb 3.2 oz (87.635 kg).    Phillips Hay PharmD CPP [facility]

## 2016-02-25 NOTE — Assessment & Plan Note (Signed)
Today his pressure remains high at 150/104.  We discussed the long term implications of hypertension as well as some possible underlying causes, in addition to family history.  Today will increase amlodipine to 10 mg and move it to evenings with his guanfacine.  He is to check his BP at home daily for the next 3 weeks and return with his cuff and log at that time.  He does admit to snoring, discussed the possibility of a sleep study if we see no improvements with med change, especially after addition of a diuretic.  Will see him for follow up in 3 weeks.

## 2016-02-25 NOTE — Patient Instructions (Signed)
Return for a a follow up appointment in 3 week  Your blood pressure today is 150/104  Check your blood pressure at home daily and keep record of the readings.  Take your BP meds as follows:  Increase amlodipine to 10 mg and move it to night time with the guanfacine.  Continue all other medications as is.  Bring all of your meds, your BP cuff and your record of home blood pressures to your next appointment.  Exercise as you're able, try to walk approximately 30 minutes per day.  Keep salt intake to a minimum, especially watch canned and prepared boxed foods.  Eat more fresh fruits and vegetables and fewer canned items.  Avoid eating in fast food restaurants.    HOW TO TAKE YOUR BLOOD PRESSURE: . Rest 5 minutes before taking your blood pressure. .  Don't smoke or drink caffeinated beverages for at least 30 minutes before. . Take your blood pressure before (not after) you eat. . Sit comfortably with your back supported and both feet on the floor (don't cross your legs). . Elevate your arm to heart level on a table or a desk. . Use the proper sized cuff. It should fit smoothly and snugly around your bare upper arm. There should be enough room to slip a fingertip under the cuff. The bottom edge of the cuff should be 1 inch above the crease of the elbow. . Ideally, take 3 measurements at one sitting and record the average.

## 2016-03-15 ENCOUNTER — Ambulatory Visit: Payer: BLUE CROSS/BLUE SHIELD

## 2016-09-02 ENCOUNTER — Other Ambulatory Visit: Payer: Self-pay | Admitting: Internal Medicine

## 2016-09-02 NOTE — Telephone Encounter (Signed)
Rx(s) sent to pharmacy electronically.  

## 2017-04-07 MED ORDER — ATRIPLA 600 MG-200 MG-300 MG TABLET
ORAL_TABLET | Freq: Every evening | ORAL | 2 refills | 0 days | Status: CP
Start: 2017-04-07 — End: 2017-04-25

## 2017-04-25 ENCOUNTER — Ambulatory Visit
Admission: RE | Admit: 2017-04-25 | Discharge: 2017-04-25 | Disposition: A | Discharge status: Home / Self Care | Payer: BC Managed Care – PPO | Attending: Infectious Disease | Admitting: Infectious Disease

## 2017-04-25 DIAGNOSIS — B20 Human immunodeficiency virus [HIV] disease: Secondary | ICD-10-CM

## 2017-04-25 DIAGNOSIS — I10 Essential (primary) hypertension: Secondary | ICD-10-CM

## 2017-04-25 MED ORDER — BICTEGRAVIR 50 MG-EMTRICITABINE 200 MG-TENOFOVIR ALAFENAM 25 MG TABLET
ORAL_TABLET | Freq: Every day | ORAL | 11 refills | 0.00000 days | Status: CP
Start: 2017-04-25 — End: 2018-02-06

## 2017-10-26 MED ORDER — METOPROLOL SUCCINATE ER 100 MG TABLET,EXTENDED RELEASE 24 HR
ORAL_TABLET | 3 refills | 0 days | Status: CP
Start: 2017-10-26 — End: 2018-03-13

## 2018-02-06 ENCOUNTER — Encounter
Admit: 2018-02-06 | Discharge: 2018-02-07 | Payer: PRIVATE HEALTH INSURANCE | Attending: Infectious Disease | Primary: Infectious Disease

## 2018-02-06 DIAGNOSIS — B20 Human immunodeficiency virus [HIV] disease: Secondary | ICD-10-CM

## 2018-02-06 DIAGNOSIS — Z113 Encounter for screening for infections with a predominantly sexual mode of transmission: Principal | ICD-10-CM

## 2018-02-06 DIAGNOSIS — I10 Essential (primary) hypertension: Secondary | ICD-10-CM

## 2018-02-06 DIAGNOSIS — E7800 Pure hypercholesterolemia, unspecified: Secondary | ICD-10-CM

## 2018-02-06 MED ORDER — BICTEGRAVIR 50 MG-EMTRICITABINE 200 MG-TENOFOVIR ALAFENAM 25 MG TABLET
ORAL_TABLET | Freq: Every day | ORAL | 11 refills | 0.00000 days | Status: CP
Start: 2018-02-06 — End: 2019-03-12

## 2018-03-13 MED ORDER — METOPROLOL SUCCINATE ER 100 MG TABLET,EXTENDED RELEASE 24 HR
ORAL_TABLET | 3 refills | 0 days | Status: CP
Start: 2018-03-13 — End: 2018-07-21

## 2018-07-13 ENCOUNTER — Ambulatory Visit: Payer: Self-pay | Admitting: Surgery

## 2018-07-13 NOTE — H&P (Addendum)
History of Present Illness Justin Cox. Justin Zorn MD; 07/13/2018 11:54 AM) The patient is a 37 year old male who presents with an umbilical hernia. Referred by Dr. Nicholos Johns for umbilical hernia  This is a 37 year old male who presents with a 4 year history of a slowly enlarging umbilical hernia. The patient works at [employer] and remembers Christmas Eve 4 years ago. He was pushing a very heavy stack of shopping carts when he felt a pulling sensation in his umbilicus. Subsequently he developed a bulge in this area. It has become larger and more uncomfortable. He presents now to seek surgical consultation for repair. The patient sees a specialist in [place] for his HIV. He states that his cell counts are stable and the viral load is low.   Past Surgical History (Justin Cox, RMA; 07/13/2018 9:35 AM) No pertinent past surgical history   Allergies (Justin Cox, RMA; 07/13/2018 9:37 AM) No Known Drug Allergies [07/13/2018]: Allergies Reconciled   Medication History (Justin Cox, RMA; 07/13/2018 9:37 AM) Justin Cox (50-200-25MG  Tablet, Oral) Active. Rosuvastatin Calcium (10MG  Tablet, Oral) Active. Allopurinol (100MG  Tablet, Oral) Active. Aspirin (325MG  Tablet, Oral) Active. Medications Reconciled  Social History (Justin Cox, RMA; 07/13/2018 9:35 AM) Alcohol use  Occasional alcohol use. Caffeine use  Carbonated beverages. No drug use  Tobacco use  Former smoker.  Family History (Justin Cox, RMA; 07/13/2018 9:35 AM) Diabetes Mellitus  Father. Hypertension  Brother, Father, Mother.  Other Problems (Justin Cox, RMA; 07/13/2018 9:35 AM) Asthma  Congestive Heart Failure  HIV-positive  Hypercholesterolemia  Umbilical Hernia Repair     Review of Systems (Justin A. Brown RMA; 07/13/2018 9:36 AM) General Not Present- Appetite Loss, Chills, Fatigue, Fever, Night Sweats, Weight Gain and Weight Loss. Skin Not Present- Change in  Wart/Mole, Dryness, Hives, Jaundice, New Lesions, Non-Healing Wounds, Rash and Ulcer. HEENT Not Present- Earache, Hearing Loss, Hoarseness, Nose Bleed, Oral Ulcers, Ringing in the Ears, Seasonal Allergies, Sinus Pain, Sore Throat, Visual Disturbances, Wears glasses/contact lenses and Yellow Eyes. Respiratory Not Present- Bloody sputum, Chronic Cough, Difficulty Breathing, Snoring and Wheezing. Cardiovascular Not Present- Chest Pain, Difficulty Breathing Lying Down, Leg Cramps, Palpitations, Rapid Heart Rate, Shortness of Breath and Swelling of Extremities. Gastrointestinal Not Present- Abdominal Pain, Bloating, Bloody Stool, Change in Bowel Habits, Chronic diarrhea, Constipation, Difficulty Swallowing, Excessive gas, Gets full quickly at meals, Hemorrhoids, Indigestion, Nausea, Rectal Pain and Vomiting. Neurological Not Present- Decreased Memory, Fainting, Headaches, Numbness, Seizures, Tingling, Tremor, Trouble walking and Weakness. Hematology Present- HIV. Not Present- Blood Thinners, Easy Bruising, Excessive bleeding, Gland problems and Persistent Infections.  Vitals (Justin A. Brown RMA; 07/13/2018 9:36 AM) 07/13/2018 9:36 AM Weight: 213.8 lb Height: 72in Body Surface Area: 2.19 m Body Mass Index: 29 kg/m  Temp.: 98.59F  Pulse: 88 (Regular)  BP: 128/86 (Sitting, Left Arm, Standard)       Physical Exam Molli Hazard K. Marynell Bies MD; 07/13/2018 11:54 AM) The physical exam findings are as follows: Note:WDWN in NAD Eyes: Pupils equal, round; sclera anicteric HENT: Oral mucosa moist; good dentition Neck: No masses palpated, no thyromegaly Lungs: CTA bilaterally; normal respiratory effort CV: Regular rate and rhythm; no murmurs; extremities well-perfused with no edema Abd: +bowel sounds, soft, non-tender, no palpable organomegaly; protruding umbilical hernia with small scar at the top of the umbilicus from previous piercing - partially reducible Skin: Warm, dry; no sign of  jaundice Psychiatric - alert and oriented x 4; calm mood and affect    Assessment & Plan Molli Hazard K. Taniya Dasher MD; 07/13/2018  9:53 AM) UMBILICAL HERNIA WITHOUT OBSTRUCTION OR GANGRENE (K42.9) Current Plans Schedule for Surgery - Umbilical hernia repair with mesh. The surgical procedure has been discussed with the patient. Potential risks, benefits, alternative treatments, and expected outcomes have been explained. All of the patient's questions at this time have been answered. The likelihood of reaching the patient's treatment goal is good. The patient understand the proposed surgical procedure and wishes to proceed. HIV DISEASE (B20)  Cardiac clearance - Dr. Beatris Ship. Corliss Skains, MD, [facility] Surgery  General/ Trauma Surgery Beeper [phone]  07/13/2018 11:56 AM

## 2018-07-24 MED ORDER — METOPROLOL SUCCINATE ER 100 MG TABLET,EXTENDED RELEASE 24 HR
ORAL_TABLET | 1 refills | 0 days | Status: CP
Start: 2018-07-24 — End: ?

## 2018-08-27 ENCOUNTER — Encounter (HOSPITAL_BASED_OUTPATIENT_CLINIC_OR_DEPARTMENT_OTHER): Payer: Self-pay | Admitting: *Deleted

## 2018-08-27 NOTE — Progress Notes (Signed)
Notified Toniann Fail after reviewing notes, cardiac testing that we will not be able to do pt here due to EF 35% , pt is on entresto and has heart failure. Faxed the above to [place].

## 2018-08-27 NOTE — Progress Notes (Signed)
Notified Wendy at Dr. Fatima Sanger  Office that we need cardiac clearance for pt. Last note in epic 01/2016 and pt was suppose to follow up in 2-3 weeks and has not.

## 2018-08-29 ENCOUNTER — Other Ambulatory Visit: Payer: Self-pay

## 2018-08-29 ENCOUNTER — Encounter (HOSPITAL_COMMUNITY): Payer: Self-pay | Admitting: *Deleted

## 2018-08-29 NOTE — Progress Notes (Signed)
Pt denies SOB and chest pain. Pt stated that he is under the care of Dr. Jacinto Halim, Cardiology. Pt denies having a chest x ray within the ;last year but stated that an EKG was performed " a couple of months ago." requested records from Dr. Jacinto Halim. Pt made aware to stop taking Aspirin (does not take), vitamins, fish oil, and herbal medications. Do not take any NSAIDs ie: Ibuprofen, Advil, Naproxen (Aleve), Motrin, BC and Goody Powder. Pt verbalized understanding of all pre-op instructions. PA, Anesthesiology asked to review pt history; see note.

## 2018-08-29 NOTE — Progress Notes (Signed)
Case:  [id number] Date/Time:  08/30/18 1045   Procedures:      HERNIA REPAIR UMBILICAL ADULT ERAS PATHWAY (N/A )     INSERTION OF MESH (N/A )   Anesthesia type:  General   Pre-op diagnosis:  Umbilical hernia   Location:  [facility] ROOM 02 / [facility] OR   Surgeon:  Manus Rudd, MD    Anesthesia Chart Review: SAME DAY WORKUP  DISCUSSION: 37 yo male former smoker. Pertinent hx includes HTN, HIV+ status, Asthma, NICM (EF 42% by echo 07/2018).  Pt follows with Dr. Jacinto Halim for hx of NICM. Per his note 08/02/2018 "Coronary angiography on 06/30/2014 which revealed normal coronary arteries with ejection fraction of 35 to 40%.   He is now on maximal medical therapy still continues to have orthopnea and shortness of breath on exertion.  No chest pain, dizziness, or syncope.  No leg edema."  Most recent echo per Dr. Verl Dicker note: TTE 07/23/2018 showed moderate concentric hypertrophy left ventricle.  Moderate decrease in LV systolic function with moderate global hypokinesis.  Normal diastolic filling pattern.  Visual EF is 35%.  Calculated EF 42%.  Mild left regurgitation.  No evidence of pulmonary retention.  Structurally normal pulmonic valve regurgitation.  No significant from 03/30/2017.  [facility] for management of HIV. HIV RNA quant undetectable by labs 02/06/2018.   Anticipate he can proceed as planned barring acute status change.   VS: There were no vitals taken for this visit.  PROVIDERS: Georgianne Fick, MD is PCP  Delrae Rend, MD is Cardiologist  Hightow-Weidman, Wynona Neat, MD is ID Physician  LABS: Will need DOS labs  IMAGES: N/A   EKG: Recent EKG tracing requested from Dr. Verl Dicker office, if note rcv'd pt will need EKG on DOS.  11/18/2015:  NSR. Rate 100. Possible LAE. LVH. Nonspecific T wave abnormality.  CV: TTE 07/23/2018 (Outside record, in Dr. Jacinto Halim notes on pt chart): Moderate concentric hypertrophy left ventricle.  Moderate decrease in LV systolic function with  moderate global hypokinesis.  Normal diastolic filling pattern.  Visual EF is 35%.  Calculated EF 42%.  Mild tricuspid regurgitation.  No evidence of pulmonary retention.  Structurally normal pulmonic valve with mild regurgitation.  No significant change from 03/30/2017.   Cath 06/30/2014: Hemodynamics: Central Aortic Pressure / Mean Aortic Pressure: 114/83 LV Pressure / LV End diastolic Pressure: 19  Left Ventriculography: EF: 35-40% Wall Motion: global hypokinesis with basal inferior severe hypokinesis MR: 1+  Coronary Angiographic Data:  Left Main: Normal  Left Anterior Descending (LAD): Large caliber vessel, angiographically normal, wraps around the apex  1st diagonal (D1): Large D1 without disease  2nd diagonal (D2): Smaller mid-distal vessel  Circumflex (LCx): Dominant, large caliber vessel without stenosis  1st obtuse marginal: Large branch, no disease  2nd obtuse marginal: Large branch, no disease 3rd obtuse marginal: Large branch, no disease  posterior lateral branch: Patent, no stenosis  Right Coronary Artery: Smaller vessel, non-dominant, no stenosis  Impression: 1. No angiographically significant coronary artery disease 2. LVEF 35-40% with global hypokinesis and more severe basal inferior hypokinesis 3. LVEDP = 19 mmHg  Plan: 1. This is likely a non-ischemic cardiomyopathy, possibly from HTN in the past or HIV disease/anti-retroviral medications 2. Will start lasix 20 mg daily for elevated LVEDP of 19 mmHg, with normal systemic blood pressure 3. Follow-up with me in 2-3 weeks. Will further optimize heart failure medications at that time.  Past Medical History:  Diagnosis Date  . Asthma   .  High cholesterol   . HIV infection (HCC)   . Hypertension     Past Surgical History:  Procedure Laterality Date  . LEFT HEART CATHETERIZATION WITH CORONARY ANGIOGRAM N/A  06/30/2014   Procedure: LEFT HEART CATHETERIZATION WITH CORONARY ANGIOGRAM;  Surgeon: [name], MD;  Location: [facility] CATH LAB;  Service: Cardiovascular;  Laterality: N/A;    MEDICATIONS: No current facility-administered medications for this encounter.    Marland Kitchen allopurinol (ZYLOPRIM) 100 MG tablet  . Ascorbic Acid (VITAMIN C) 1000 MG tablet  . aspirin EC 325 MG EC tablet  . aspirin-acetaminophen-caffeine (EXCEDRIN MIGRAINE) 250-250-65 MG tablet  . BIDIL 20-37.5 MG tablet  . BIKTARVY 50-200-25 MG TABS tablet  . ibuprofen (ADVIL,MOTRIN) 200 MG tablet  . metoprolol succinate (TOPROL-XL) 100 MG 24 hr tablet  . rosuvastatin (CRESTOR) 10 MG tablet     [facility] Short Stay Center/Anesthesiology Phone [phone] 08/29/2018 3:48 PM

## 2018-08-29 NOTE — Anesthesia Preprocedure Evaluation (Addendum)
Anesthesia Evaluation  Patient identified by MRN, date of birth, ID band Patient awake    Reviewed: Allergy & Precautions, NPO status , Patient's Chart, lab work & pertinent test results, reviewed documented beta blocker date and time   History of Anesthesia Complications Negative for: history of anesthetic complications  Airway Mallampati: II  TM Distance: >3 FB Neck ROM: Full   Comment: Tongue piercing Dental no notable dental hx.    Pulmonary asthma , sleep apnea , former smoker,    Pulmonary exam normal        Cardiovascular Exercise Tolerance: Good hypertension, Pt. on medications and Pt. on home beta blockers +CHF and + DOE  Normal cardiovascular exam  Able to walk a flight of stairs without stopping   Neuro/Psych TIAnegative psych ROS   GI/Hepatic negative GI ROS, Neg liver ROS,   Endo/Other  negative endocrine ROS  Renal/GU negative Renal ROS  negative genitourinary   Musculoskeletal  (+) Arthritis ,   Abdominal   Peds  Hematology  (+) HIV,   Anesthesia Other Findings 37 yo M for umbilical hernia repair - TIA, asthma, HTN, NICM (35-40%), HIV, gout  Reproductive/Obstetrics                          Anesthesia Physical Anesthesia Plan  ASA: III  Anesthesia Plan: General   Post-op Pain Management:    Induction: Intravenous  PONV Risk Score and Plan: 2 and Ondansetron, Dexamethasone, Midazolam and Treatment may vary due to age or medical condition  Airway Management Planned: Oral ETT  Additional Equipment: None  Intra-op Plan:   Post-operative Plan: Extubation in OR  Informed Consent: I have reviewed the patients History and Physical, chart, labs and discussed the procedure including the risks, benefits and alternatives for the proposed anesthesia with the patient or authorized representative who has indicated his/her understanding and acceptance.     Plan Discussed  with:   Anesthesia Plan Comments: (See PAT note 08/29/2018 by Antionette Poles, PA-C\)      Anesthesia Quick Evaluation

## 2018-08-29 NOTE — Progress Notes (Signed)
Pt denies recent labs. 

## 2018-08-30 ENCOUNTER — Encounter (HOSPITAL_COMMUNITY): Payer: Self-pay

## 2018-08-30 ENCOUNTER — Other Ambulatory Visit: Payer: Self-pay

## 2018-08-30 ENCOUNTER — Encounter (HOSPITAL_COMMUNITY)
Admission: RE | Disposition: A | Discharge status: Home / Self Care | Payer: Self-pay | Source: Home / Self Care | Attending: Surgery

## 2018-08-30 ENCOUNTER — Ambulatory Visit (HOSPITAL_COMMUNITY): Payer: BLUE CROSS/BLUE SHIELD | Admitting: Anesthesiology

## 2018-08-30 ENCOUNTER — Ambulatory Visit (HOSPITAL_COMMUNITY)
Admission: RE | Admit: 2018-08-30 | Discharge: 2018-08-30 | Disposition: A | Discharge status: Home / Self Care | Payer: BLUE CROSS/BLUE SHIELD | Attending: Surgery | Admitting: Surgery

## 2018-08-30 DIAGNOSIS — G4730 Sleep apnea, unspecified: Secondary | ICD-10-CM | POA: Insufficient documentation

## 2018-08-30 DIAGNOSIS — E7800 Pure hypercholesterolemia, unspecified: Secondary | ICD-10-CM | POA: Diagnosis not present

## 2018-08-30 DIAGNOSIS — Z79899 Other long term (current) drug therapy: Secondary | ICD-10-CM | POA: Insufficient documentation

## 2018-08-30 DIAGNOSIS — Z87891 Personal history of nicotine dependence: Secondary | ICD-10-CM | POA: Diagnosis not present

## 2018-08-30 DIAGNOSIS — Z8249 Family history of ischemic heart disease and other diseases of the circulatory system: Secondary | ICD-10-CM | POA: Insufficient documentation

## 2018-08-30 DIAGNOSIS — Z21 Asymptomatic human immunodeficiency virus [HIV] infection status: Secondary | ICD-10-CM | POA: Insufficient documentation

## 2018-08-30 DIAGNOSIS — J45909 Unspecified asthma, uncomplicated: Secondary | ICD-10-CM | POA: Insufficient documentation

## 2018-08-30 DIAGNOSIS — I110 Hypertensive heart disease with heart failure: Secondary | ICD-10-CM | POA: Insufficient documentation

## 2018-08-30 DIAGNOSIS — I428 Other cardiomyopathies: Secondary | ICD-10-CM | POA: Diagnosis not present

## 2018-08-30 DIAGNOSIS — M109 Gout, unspecified: Secondary | ICD-10-CM | POA: Insufficient documentation

## 2018-08-30 DIAGNOSIS — I509 Heart failure, unspecified: Secondary | ICD-10-CM | POA: Insufficient documentation

## 2018-08-30 DIAGNOSIS — K429 Umbilical hernia without obstruction or gangrene: Secondary | ICD-10-CM | POA: Diagnosis not present

## 2018-08-30 DIAGNOSIS — Z7982 Long term (current) use of aspirin: Secondary | ICD-10-CM | POA: Insufficient documentation

## 2018-08-30 HISTORY — DX: Heart failure, unspecified: I50.9

## 2018-08-30 HISTORY — DX: Headache, unspecified: R51.9

## 2018-08-30 HISTORY — PX: INSERTION OF MESH: SHX5868

## 2018-08-30 HISTORY — DX: Transient cerebral ischemic attack, unspecified: G45.9

## 2018-08-30 HISTORY — DX: Umbilical hernia without obstruction or gangrene: K42.9

## 2018-08-30 HISTORY — PX: UMBILICAL HERNIA REPAIR: SHX196

## 2018-08-30 HISTORY — DX: Headache: R51

## 2018-08-30 LAB — CBC
HEMATOCRIT: 49.3 % (ref 39.0–52.0)
Hemoglobin: 15.7 g/dL (ref 13.0–17.0)
MCH: 33 pg (ref 26.0–34.0)
MCHC: 31.8 g/dL (ref 30.0–36.0)
MCV: 103.6 fL — AB (ref 80.0–100.0)
Platelets: 156 10*3/uL (ref 150–400)
RBC: 4.76 MIL/uL (ref 4.22–5.81)
RDW: 13.1 % (ref 11.5–15.5)
WBC: 5.1 10*3/uL (ref 4.0–10.5)
nRBC: 0 % (ref 0.0–0.2)

## 2018-08-30 LAB — BASIC METABOLIC PANEL
Anion gap: 13 (ref 5–15)
BUN: 12 mg/dL (ref 6–20)
CALCIUM: 9.1 mg/dL (ref 8.9–10.3)
CHLORIDE: 106 mmol/L (ref 98–111)
CO2: 21 mmol/L — ABNORMAL LOW (ref 22–32)
Creatinine, Ser: 1.12 mg/dL (ref 0.61–1.24)
GFR calc Af Amer: >60 mL/min (ref >60)
Glucose, Bld: 96 mg/dL (ref 70–99)
Potassium: 3.7 mmol/L (ref 3.5–5.1)
Sodium: 140 mmol/L (ref 135–145)

## 2018-08-30 SURGERY — REPAIR, HERNIA, UMBILICAL, ADULT
Anesthesia: General | Site: Abdomen

## 2018-08-30 MED ORDER — OXYCODONE HCL 5 MG PO TABS
5.0000 mg | ORAL_TABLET | Freq: Once | ORAL | Status: AC | PRN
  Administered 2018-08-30: 5 mg via ORAL

## 2018-08-30 MED ORDER — ONDANSETRON HCL 4 MG/2ML IJ SOLN
INTRAMUSCULAR | Status: DC | PRN
  Administered 2018-08-30: 4 mg via INTRAVENOUS

## 2018-08-30 MED ORDER — GABAPENTIN 300 MG PO CAPS
ORAL_CAPSULE | ORAL | Status: AC
  Administered 2018-08-30: 300 mg via ORAL
  Filled 2018-08-30: qty 1

## 2018-08-30 MED ORDER — ROCURONIUM BROMIDE 50 MG/5ML IV SOSY
PREFILLED_SYRINGE | INTRAVENOUS | Status: AC
  Filled 2018-08-30: qty 5

## 2018-08-30 MED ORDER — MIDAZOLAM HCL 2 MG/2ML IJ SOLN
INTRAMUSCULAR | Status: DC | PRN
  Administered 2018-08-30: 2 mg via INTRAVENOUS

## 2018-08-30 MED ORDER — ROCURONIUM BROMIDE 50 MG/5ML IV SOSY
PREFILLED_SYRINGE | INTRAVENOUS | Status: DC | PRN
  Administered 2018-08-30: 50 mg via INTRAVENOUS

## 2018-08-30 MED ORDER — FENTANYL CITRATE (PF) 250 MCG/5ML IJ SOLN
INTRAMUSCULAR | Status: DC | PRN
  Administered 2018-08-30: 25 ug via INTRAVENOUS
  Administered 2018-08-30: 100 ug via INTRAVENOUS

## 2018-08-30 MED ORDER — LIDOCAINE 2% (20 MG/ML) 5 ML SYRINGE
INTRAMUSCULAR | Status: AC
  Filled 2018-08-30: qty 5

## 2018-08-30 MED ORDER — ACETAMINOPHEN 500 MG PO TABS
ORAL_TABLET | ORAL | Status: AC
  Administered 2018-08-30: 1000 mg via ORAL
  Filled 2018-08-30: qty 2

## 2018-08-30 MED ORDER — OXYCODONE HCL 5 MG PO TABS: 5.0000 mg | ORAL_TABLET | Freq: Four times a day (QID) | ORAL | 0 refills | Status: DC | PRN

## 2018-08-30 MED ORDER — GABAPENTIN 300 MG PO CAPS
300.0000 mg | ORAL_CAPSULE | ORAL | Status: AC
  Administered 2018-08-30: 300 mg via ORAL

## 2018-08-30 MED ORDER — OXYCODONE HCL 5 MG/5ML PO SOLN: 5.0000 mg | Freq: Once | ORAL | Status: AC | PRN

## 2018-08-30 MED ORDER — ACETAMINOPHEN 500 MG PO TABS
1000.0000 mg | ORAL_TABLET | ORAL | Status: AC
  Administered 2018-08-30: 1000 mg via ORAL

## 2018-08-30 MED ORDER — OXYCODONE HCL 5 MG PO TABS
ORAL_TABLET | ORAL | Status: AC
  Filled 2018-08-30: qty 1

## 2018-08-30 MED ORDER — 0.9 % SODIUM CHLORIDE (POUR BTL) OPTIME
TOPICAL | Status: DC | PRN
  Administered 2018-08-30: 1000 mL

## 2018-08-30 MED ORDER — BUPIVACAINE-EPINEPHRINE (PF) 0.25% -1:200000 IJ SOLN
INTRAMUSCULAR | Status: AC
  Filled 2018-08-30: qty 30

## 2018-08-30 MED ORDER — STERILE WATER FOR IRRIGATION IR SOLN
Status: DC | PRN
  Administered 2018-08-30: 1000 mL

## 2018-08-30 MED ORDER — CEFAZOLIN SODIUM-DEXTROSE 2-4 GM/100ML-% IV SOLN
2.0000 g | INTRAVENOUS | Status: AC
  Administered 2018-08-30: 2 g via INTRAVENOUS

## 2018-08-30 MED ORDER — SUGAMMADEX SODIUM 200 MG/2ML IV SOLN
INTRAVENOUS | Status: DC | PRN
  Administered 2018-08-30: 400 mg via INTRAVENOUS

## 2018-08-30 MED ORDER — FENTANYL CITRATE (PF) 100 MCG/2ML IJ SOLN: 25.0000 ug | INTRAMUSCULAR | Status: DC | PRN

## 2018-08-30 MED ORDER — CHLORHEXIDINE GLUCONATE CLOTH 2 % EX PADS: 6.0000 | MEDICATED_PAD | Freq: Once | CUTANEOUS | Status: DC

## 2018-08-30 MED ORDER — DEXAMETHASONE SODIUM PHOSPHATE 10 MG/ML IJ SOLN
INTRAMUSCULAR | Status: DC | PRN
  Administered 2018-08-30: 10 mg via INTRAVENOUS

## 2018-08-30 MED ORDER — FENTANYL CITRATE (PF) 250 MCG/5ML IJ SOLN
INTRAMUSCULAR | Status: AC
  Filled 2018-08-30: qty 5

## 2018-08-30 MED ORDER — LIDOCAINE 2% (20 MG/ML) 5 ML SYRINGE
INTRAMUSCULAR | Status: DC | PRN
  Administered 2018-08-30: 100 mg via INTRAVENOUS

## 2018-08-30 MED ORDER — BUPIVACAINE-EPINEPHRINE 0.25% -1:200000 IJ SOLN
INTRAMUSCULAR | Status: DC | PRN
  Administered 2018-08-30: 10 mL

## 2018-08-30 MED ORDER — SUGAMMADEX SODIUM 500 MG/5ML IV SOLN
INTRAVENOUS | Status: AC
  Filled 2018-08-30: qty 5

## 2018-08-30 MED ORDER — ONDANSETRON HCL 4 MG/2ML IJ SOLN
INTRAMUSCULAR | Status: AC
  Filled 2018-08-30: qty 2

## 2018-08-30 MED ORDER — PROPOFOL 10 MG/ML IV BOLUS
INTRAVENOUS | Status: DC | PRN
  Administered 2018-08-30: 100 mg via INTRAVENOUS
  Administered 2018-08-30: 50 mg via INTRAVENOUS

## 2018-08-30 MED ORDER — LACTATED RINGERS IV SOLN
INTRAVENOUS | Status: DC
  Administered 2018-08-30: 11:00:00 via INTRAVENOUS

## 2018-08-30 MED ORDER — DEXAMETHASONE SODIUM PHOSPHATE 10 MG/ML IJ SOLN
INTRAMUSCULAR | Status: AC
  Filled 2018-08-30: qty 1

## 2018-08-30 MED ORDER — MIDAZOLAM HCL 2 MG/2ML IJ SOLN
INTRAMUSCULAR | Status: AC
  Filled 2018-08-30: qty 2

## 2018-08-30 MED ORDER — ONDANSETRON HCL 4 MG/2ML IJ SOLN: 4.0000 mg | Freq: Once | INTRAMUSCULAR | Status: DC | PRN

## 2018-08-30 MED ORDER — CEFAZOLIN SODIUM-DEXTROSE 2-4 GM/100ML-% IV SOLN
INTRAVENOUS | Status: AC
  Filled 2018-08-30: qty 100

## 2018-08-30 SURGICAL SUPPLY — 49 items
BENZOIN TINCTURE PRP APPL 2/3 (GAUZE/BANDAGES/DRESSINGS) ×3 IMPLANT
BLADE CLIPPER SURG (BLADE) ×3 IMPLANT
BLADE SURG 15 STRL LF DISP TIS (BLADE) ×1 IMPLANT
BLADE SURG 15 STRL SS (BLADE) ×2
CANISTER SUCT 3000ML PPV (MISCELLANEOUS) IMPLANT
CHLORAPREP W/TINT 26ML (MISCELLANEOUS) ×3 IMPLANT
CLOSURE STERI-STRIP 1/2X4 (GAUZE/BANDAGES/DRESSINGS) ×1
CLOSURE WOUND 1/2 X4 (GAUZE/BANDAGES/DRESSINGS) ×1
CLSR STERI-STRIP ANTIMIC 1/2X4 (GAUZE/BANDAGES/DRESSINGS) ×2 IMPLANT
COVER SURGICAL LIGHT HANDLE (MISCELLANEOUS) ×3 IMPLANT
COVER WAND RF STERILE (DRAPES) IMPLANT
DRAPE LAPAROTOMY 100X72 PEDS (DRAPES) ×3 IMPLANT
DRAPE UTILITY XL STRL (DRAPES) ×3 IMPLANT
DRSG TEGADERM 4X4.75 (GAUZE/BANDAGES/DRESSINGS) ×3 IMPLANT
ELECT CAUTERY BLADE 6.4 (BLADE) ×3 IMPLANT
ELECT REM PT RETURN 9FT ADLT (ELECTROSURGICAL) ×3
ELECTRODE REM PT RTRN 9FT ADLT (ELECTROSURGICAL) ×1 IMPLANT
GAUZE 4X4 16PLY RFD (DISPOSABLE) IMPLANT
GAUZE SPONGE 2X2 8PLY STRL LF (GAUZE/BANDAGES/DRESSINGS) ×1 IMPLANT
GLOVE BIO SURGEON STRL SZ7 (GLOVE) ×3 IMPLANT
GLOVE BIOGEL PI IND STRL 7.5 (GLOVE) ×1 IMPLANT
GLOVE BIOGEL PI INDICATOR 7.5 (GLOVE) ×2
GOWN STRL REUS W/ TWL LRG LVL3 (GOWN DISPOSABLE) ×2 IMPLANT
GOWN STRL REUS W/TWL LRG LVL3 (GOWN DISPOSABLE) ×4
KIT BASIN OR (CUSTOM PROCEDURE TRAY) ×3 IMPLANT
KIT TURNOVER KIT B (KITS) ×3 IMPLANT
MANIFOLD NEPTUNE WASTE (CANNULA) ×3 IMPLANT
MARKER SKIN DUAL TIP RULER LAB (MISCELLANEOUS) ×3 IMPLANT
MESH VENTRALEX ST 1-7/10 CRC S (Mesh General) ×3 IMPLANT
NEEDLE HYPO 25GX1X1/2 BEV (NEEDLE) ×3 IMPLANT
NS IRRIG 1000ML POUR BTL (IV SOLUTION) ×3 IMPLANT
PACK SURGICAL SETUP 50X90 (CUSTOM PROCEDURE TRAY) ×3 IMPLANT
PAD ARMBOARD 7.5X6 YLW CONV (MISCELLANEOUS) ×3 IMPLANT
PENCIL BUTTON HOLSTER BLD 10FT (ELECTRODE) ×3 IMPLANT
PENCIL SMOKE EVACUATOR (MISCELLANEOUS) ×3 IMPLANT
SPONGE GAUZE 2X2 STER 10/PKG (GAUZE/BANDAGES/DRESSINGS) ×2
STRIP CLOSURE SKIN 1/2X4 (GAUZE/BANDAGES/DRESSINGS) ×2 IMPLANT
SUT MNCRL AB 4-0 PS2 18 (SUTURE) ×3 IMPLANT
SUT NOVA NAB DX-16 0-1 5-0 T12 (SUTURE) ×3 IMPLANT
SUT NOVA NAB GS-21 0 18 T12 DT (SUTURE) ×6 IMPLANT
SUT VIC AB 3-0 SH 27 (SUTURE) ×2
SUT VIC AB 3-0 SH 27X BRD (SUTURE) ×1 IMPLANT
SYR BULB 3OZ (MISCELLANEOUS) ×3 IMPLANT
SYR CONTROL 10ML LL (SYRINGE) ×3 IMPLANT
TOWEL OR 17X24 6PK STRL BLUE (TOWEL DISPOSABLE) ×3 IMPLANT
TOWEL OR 17X26 10 PK STRL BLUE (TOWEL DISPOSABLE) IMPLANT
TUBE CONNECTING 12'X1/4 (SUCTIONS)
TUBE CONNECTING 12X1/4 (SUCTIONS) IMPLANT
YANKAUER SUCT BULB TIP NO VENT (SUCTIONS) IMPLANT

## 2018-08-30 NOTE — Discharge Instructions (Signed)
[facility] _______[facility] ° °UMBILICAL HERNIA REPAIR: POST OP INSTRUCTIONS ° °Always review your discharge instruction sheet given to you by the facility where your surgery was performed. °IF YOU HAVE DISABILITY OR FAMILY LEAVE FORMS, YOU MUST BRING THEM TO THE OFFICE FOR PROCESSING.   °DO NOT GIVE THEM TO YOUR DOCTOR. ° °1. A  prescription for pain medication may be given to you upon discharge.  Take your pain medication as prescribed, if needed.  If narcotic pain medicine is not needed, then you may take acetaminophen (Tylenol) or ibuprofen (Advil) as needed. °2. Take your usually prescribed medications unless otherwise directed. °If you need a refill on your pain medication, please contact your pharmacy.  They will contact our office to request authorization. Prescriptions will not be filled after 5 pm or on week-ends. °3. You should follow a light diet the first 24 hours after arrival home, such as soup and crackers, etc.  Be sure to include lots of fluids daily.  Resume your normal diet the day after surgery. °4.Most patients will experience some swelling and bruising around the umbilicus or in the groin and scrotum.  Ice packs and reclining will help.  Swelling and bruising can take several days to resolve.  °6. It is common to experience some constipation if taking pain medication after surgery.  Increasing fluid intake and taking a stool softener (such as Colace) will usually help or prevent this problem from occurring.  A mild laxative (Milk of Magnesia or Miralax) should be taken according to package directions if there are no bowel movements after 48 hours. °7. Unless discharge instructions indicate otherwise, you may remove your bandages 24-48 hours after surgery, and you may shower at that time.  You may have steri-strips (small skin tapes) in place directly over the incision.  These strips should be left on the skin for 7-10 days.  If your surgeon used skin glue on the incision, you may  shower in 24 hours.  The glue will flake off over the next 2-3 weeks.  Any sutures or staples will be removed at the office during your follow-up visit. °8. ACTIVITIES:  You may resume regular (light) daily activities beginning the next day--such as daily self-care, walking, climbing stairs--gradually increasing activities as tolerated.  You may have sexual intercourse when it is comfortable.  Refrain from any heavy lifting or straining until approved by your doctor. ° °a.You may drive when you are no longer taking prescription pain medication, you can comfortably wear a seatbelt, and you can safely maneuver your car and apply brakes. °b.RETURN TO WORK:   °_____________________________________________ ° °9.You should see your doctor in the office for a follow-up appointment approximately 2-3 weeks after your surgery.  Make sure that you call for this appointment within a day or two after you arrive home to insure a convenient appointment time. °10.OTHER INSTRUCTIONS: _________________________ °   _____________________________________ ° °WHEN TO CALL YOUR DOCTOR: °1. Fever over 101.0 °2. Inability to urinate °3. Nausea and/or vomiting °4. Extreme swelling or bruising °5. Continued bleeding from incision. °6. Increased pain, redness, or drainage from the incision ° °The clinic staff is available to answer your questions during regular business hours.  Please don’t hesitate to call and ask to speak to one of the nurses for clinical concerns.  If you have a medical emergency, go to the nearest emergency room or call 911.  A surgeon from [facility] is always on call at the hospital ° ° °[address], [address], [place], [place]  [address] ? ° [address] [address], , [place]   [address] °[phone] ? [phone] ? FAX [phone] °Web site: [email or web address] ° ° ° °Post Anesthesia Home Care Instructions ° °Activity: °Get plenty of rest for the remainder of the day. A responsible  individual must stay with you for 24 hours following the procedure.  °For the next 24 hours, DO NOT: °-Drive a car °-Operate machinery °-Drink alcoholic beverages °-Take any medication unless instructed by your physician °-Make any legal decisions or sign important papers. ° °Meals: °Start with liquid foods such as gelatin or soup. Progress to regular foods as tolerated. Avoid greasy, spicy, heavy foods. If nausea and/or vomiting occur, drink only clear liquids until the nausea and/or vomiting subsides. Call your physician if vomiting continues. ° °Special Instructions/Symptoms: °Your throat may feel dry or sore from the anesthesia or the breathing tube placed in your throat during surgery. If this causes discomfort, gargle with warm salt water. The discomfort should disappear within 24 hours. ° °If you had a scopolamine patch placed behind your ear for the management of post- operative nausea and/or vomiting: ° °1. The medication in the patch is effective for 72 hours, after which it should be removed.  Wrap patch in a tissue and discard in the trash. Wash hands thoroughly with soap and water. °2. You may remove the patch earlier than 72 hours if you experience unpleasant side effects which may include dry mouth, dizziness or visual disturbances. °3. Avoid touching the patch. Wash your hands with soap and water after contact with the patch. °  ° °

## 2018-08-30 NOTE — H&P (Signed)
History of Present Illness  The patient is a 37 year old male who presents with an umbilical hernia. Referred by Dr. Nicholos Johns for umbilical hernia  This is a 37 year old male who presents with a 4 year history of a slowly enlarging umbilical hernia. The patient works at [employer] and remembers Christmas Eve 4 years ago. He was pushing a very heavy stack of shopping carts when he felt a pulling sensation in his umbilicus. Subsequently he developed a bulge in this area. It has become larger and more uncomfortable. He presents now to seek surgical consultation for repair. The patient sees a specialist in [place] for his HIV. He states that his cell counts are stable and the viral load is low.   Past Surgical History No pertinent past surgical history   Allergies  No Known Drug Allergies [07/13/2018]: Allergies Reconciled   Medication History  Biktarvy (50-200-25MG  Tablet, Oral) Active. Rosuvastatin Calcium (10MG  Tablet, Oral) Active. Allopurinol (100MG  Tablet, Oral) Active. Aspirin (325MG  Tablet, Oral) Active. Medications Reconciled  Social History  Alcohol use  Occasional alcohol use. Caffeine use  Carbonated beverages. No drug use  Tobacco use  Former smoker.  Family History  Diabetes Mellitus  Father. Hypertension  Brother, Father, Mother.  Other Problems  Asthma  Congestive Heart Failure  HIV-positive  Hypercholesterolemia  Umbilical Hernia Repair     Review of Systems  General Not Present- Appetite Loss, Chills, Fatigue, Fever, Night Sweats, Weight Gain and Weight Loss. Skin Not Present- Change in Wart/Mole, Dryness, Hives, Jaundice, New Lesions, Non-Healing Wounds, Rash and Ulcer. HEENT Not Present- Earache, Hearing Loss, Hoarseness, Nose Bleed, Oral Ulcers, Ringing in the Ears, Seasonal Allergies, Sinus Pain, Sore Throat, Visual Disturbances, Wears glasses/contact lenses and Yellow Eyes. Respiratory Not Present- Bloody sputum,  Chronic Cough, Difficulty Breathing, Snoring and Wheezing. Cardiovascular Not Present- Chest Pain, Difficulty Breathing Lying Down, Leg Cramps, Palpitations, Rapid Heart Rate, Shortness of Breath and Swelling of Extremities. Gastrointestinal Not Present- Abdominal Pain, Bloating, Bloody Stool, Change in Bowel Habits, Chronic diarrhea, Constipation, Difficulty Swallowing, Excessive gas, Gets full quickly at meals, Hemorrhoids, Indigestion, Nausea, Rectal Pain and Vomiting. Neurological Not Present- Decreased Memory, Fainting, Headaches, Numbness, Seizures, Tingling, Tremor, Trouble walking and Weakness. Hematology Present- HIV. Not Present- Blood Thinners, Easy Bruising, Excessive bleeding, Gland problems and Persistent Infections.  Vitals  Weight: 213.8 lb Height: 72in Body Surface Area: 2.19 m Body Mass Index: 29 kg/m  Temp.: 98.49F  Pulse: 88 (Regular)  BP: 128/86 (Sitting, Left Arm, Standard)       Physical Exam  The physical exam findings are as follows: Note:WDWN in NAD Eyes: Pupils equal, round; sclera anicteric HENT: Oral mucosa moist; good dentition Neck: No masses palpated, no thyromegaly Lungs: CTA bilaterally; normal respiratory effort CV: Regular rate and rhythm; no murmurs; extremities well-perfused with no edema Abd: +bowel sounds, soft, non-tender, no palpable organomegaly; protruding umbilical hernia with small scar at the top of the umbilicus from previous piercing - partially reducible Skin: Warm, dry; no sign of jaundice Psychiatric - alert and oriented x 4; calm mood and affect    Assessment & Plan  UMBILICAL HERNIA WITHOUT OBSTRUCTION OR GANGRENE (K42.9) Current Plans Schedule for Surgery - Umbilical hernia repair with mesh. The surgical procedure has been discussed with the patient. Potential risks, benefits, alternative treatments, and expected outcomes have been explained. All of the patient's questions at this time have been  answered. The likelihood of reaching the patient's treatment goal is good. The patient understand the proposed surgical procedure and wishes  to proceed. HIV DISEASE (B20)   Wilmon Arms. Corliss Skains, MD, [facility]  General/ Trauma Surgery Beeper [phone]  08/30/2018 9:49 AM

## 2018-08-30 NOTE — Op Note (Signed)
Indications:  The patient presented with a history of an enlarging painful umbilical hernia.  The patient was examined and we recommended umbilical hernia repair with mesh.  Pre-operative diagnosis:  Umbilical hernia  Post-operative diagnosis:  Same  Procedure:  Umbilical hernia repair with mesh  Procedure Details  The patient was seen again in the Holding Room. The risks, benefits, complications, treatment options, and expected outcomes were discussed with the patient. The possibilities of reaction to medication, pulmonary aspiration, perforation of viscus, bleeding, recurrent infection, the need for additional procedures, and development of a complication requiring transfusion or further operation were discussed with the patient and/or family. There was concurrence with the proposed plan, and informed consent was obtained. The site of surgery was properly noted/marked. The patient was taken to the Operating Room, identified as Justin Cox, and the procedure verified as umbilical hernia repair. A Time Out was held and the above information confirmed.  After an adequate level of general anesthesia was obtained, the patient's abdomen was prepped with Chloraprep and draped in sterile fashion.  We made a transverse incision below the umbilicus.  Dissection was carried down to the hernia sac with cautery.  We dissected bluntly around the hernia sac down to the edge of the fascial defect.  We reduced the hernia sac back into the pre-peritoneal space.  The fascial defect measured 1.5 cm.  We cleared the fascia in all directions.  A small Ventralex mesh was inserted into the pre-peritoneal space and was deployed.  The mesh was secured with four trans-fascial sutures of 0 Novofil.  The fascial defect was closed with multiple interrupted figure-of-eight 1 Novofil sutures.  The base of the umbilicus was tacked down with 3-0 Vicryl.  3-0 Vicryl was used to close the subcutaneous tissues and 4-0 Monocryl was used to  close the skin.  Steri-strips and clean dressing were applied.  The patient was extubated and brought to the recovery room in stable condition.  All sponge, instrument, and needle counts were correct prior to closure and at the conclusion of the case.   Estimated Blood Loss: Minimal          Complications: None; patient tolerated the procedure well.         Disposition: PACU - hemodynamically stable.         Condition: stable  Wilmon Arms. Corliss Skains, MD, [facility]  General/ Trauma Surgery Beeper [phone]  08/30/2018 11:35 AM

## 2018-08-30 NOTE — Anesthesia Procedure Notes (Signed)
Procedure Name: Intubation Date/Time: 08/30/2018 10:50 AM Performed by: Modena Morrow, CRNA Pre-anesthesia Checklist: Patient identified, Emergency Drugs available, Suction available, Patient being monitored and Timeout performed Patient Re-evaluated:Patient Re-evaluated prior to induction Oxygen Delivery Method: Circle system utilized Preoxygenation: Pre-oxygenation with 100% oxygen Induction Type: IV induction Ventilation: Mask ventilation without difficulty Laryngoscope Size: Glidescope Grade View: Grade I Tube type: Oral Tube size: 7.5 mm Number of attempts: 2 Placement Confirmation: ETT inserted through vocal cords under direct vision,  positive ETCO2 and breath sounds checked- equal and bilateral Secured at: 23 cm Tube secured with: Tape Dental Injury: Teeth and Oropharynx as per pre-operative assessment  Comments: Hyacinth Meeker 2 Grade 1 view but patient did not have BBS. Utilized [employer]

## 2018-08-30 NOTE — Anesthesia Postprocedure Evaluation (Signed)
Anesthesia Post Note  Patient: Justin Cox  Procedure(s) Performed: HERNIA REPAIR UMBILICAL ADULT ERAS PATHWAY (N/A Abdomen) INSERTION OF MESH (N/A Abdomen)     Patient location during evaluation: PACU Anesthesia Type: General Level of consciousness: awake and alert Pain management: pain level controlled Vital Signs Assessment: post-procedure vital signs reviewed and stable Respiratory status: spontaneous breathing, nonlabored ventilation and respiratory function stable Cardiovascular status: blood pressure returned to baseline and stable Postop Assessment: no apparent nausea or vomiting Anesthetic complications: no    Last Vitals:  Vitals:   08/30/18 0935 08/30/18 1150  BP: (!) 146/91 (!) 164/91  Pulse:  85  Resp:  18  Temp:  (!) 36.4 C  SpO2:  94%    Last Pain:  Vitals:   08/30/18 1150  PainSc: 0-No pain                 Lucretia Kern

## 2018-08-30 NOTE — Transfer of Care (Signed)
Immediate Anesthesia Transfer of Care Note  Patient: Justin Cox  Procedure(s) Performed: HERNIA REPAIR UMBILICAL ADULT ERAS PATHWAY (N/A Abdomen) INSERTION OF MESH (N/A Abdomen)  Patient Location: PACU  Anesthesia Type:General  Level of Consciousness: drowsy and patient cooperative  Airway & Oxygen Therapy: Patient Spontanous Breathing and Patient connected to face mask oxygen  Post-op Assessment: Report given to RN, Post -op Vital signs reviewed and stable and Patient moving all extremities  Post vital signs: Reviewed and stable  Last Vitals:  Vitals Value Taken Time  BP    Temp    Pulse 93 08/30/2018 11:53 AM  Resp 14 08/30/2018 11:53 AM  SpO2 95 % 08/30/2018 11:53 AM  Vitals shown include unvalidated device data.  Last Pain:  Vitals:   08/30/18 0934  PainSc: 0-No pain         Complications: No apparent anesthesia complications

## 2018-08-31 ENCOUNTER — Encounter (HOSPITAL_COMMUNITY): Payer: Self-pay | Admitting: Surgery

## 2018-09-10 ENCOUNTER — Encounter (HOSPITAL_COMMUNITY): Payer: Self-pay | Admitting: Surgery

## 2018-11-20 ENCOUNTER — Telehealth: Payer: Self-pay

## 2018-11-30 NOTE — Progress Notes (Signed)
Subjective:  Primary Physician:  Merrilee Seashore, MD  Patient ID: Justin Cox, male    DOB: 1980-10-01, 38 y.o.   MRN: [id number]  Chief Complaint  Patient presents with  . Congestive Heart Failure  . Hypertension    HPI: Justin Cox  is a 38 y.o. male  with nonischemic dilated cardiomyopathy, coronary angiography on 06/30/2014 which revealed normal coronary arteries with ejection fraction of 35-40%, HIV infection, hyperlipidemia, mixed, hypertension, and resistant hypertension. Has had negative renal artery duplex in July 2018.  Comes in for 3 month follow-up visit of cardiomyopathy and also hypertension which has been resistant to control. Tolerating all his medications well. His dyspnea has improved and no PND or orthopnea. He was also referred for sleep study, he has now made an appointment for the same. No chest pain, dizziness or syncope. No leg edema. He has been compliant with his meds and states he has made lifestyle changes especially with his diet.  Past Medical History:  Diagnosis Date  . Asthma   . CHF (congestive heart failure) ([place])   . Headache   . High cholesterol   . HIV infection ([place])   . Hypertension   . TIA (transient ischemic attack)   . Umbilical hernia     Past Surgical History:  Procedure Laterality Date  . INSERTION OF MESH N/A 08/30/2018   Procedure: INSERTION OF MESH;  Surgeon: Donnie Mesa, MD;  Location: [place];  Service: General;  Laterality: N/A;  . LEFT HEART CATHETERIZATION WITH CORONARY ANGIOGRAM N/A 06/30/2014   Procedure: LEFT HEART CATHETERIZATION WITH CORONARY ANGIOGRAM;  Surgeon: Pixie Casino, MD;  Location: [facility] CATH LAB;  Service: Cardiovascular;  Laterality: N/A;  . UMBILICAL HERNIA REPAIR N/A 08/30/2018   Procedure: HERNIA REPAIR UMBILICAL ADULT ERAS PATHWAY;  Surgeon: Donnie Mesa, MD;  Location: [place];  Service: General;  Laterality: N/A;    Social History   Socioeconomic History  . Marital status: Single   Spouse name: Not on file  . Number of children: 0  . Years of education: 12th  . Highest education level: Not on file  Occupational History    Employer: [employer]  . Financial resource strain: Not on file  . Food insecurity:    Worry: Not on file    Inability: Not on file  . Transportation needs:    Medical: Not on file    Non-medical: Not on file  Tobacco Use  . Smoking status: Former Smoker    Years: 6.00    Last attempt to quit: 10/20/2010    Years since quitting: 8.1  . Smokeless tobacco: Never Used  . Tobacco comment: smoked about 2 packs per week (when used to smoke)  Substance and Sexual Activity  . Alcohol use: Yes    Alcohol/week: 3.0 standard drinks    Types: 3 Standard drinks or equivalent per week    Comment: Occasional  . Drug use: No  . Sexual activity: Not on file  Lifestyle  . Physical activity:    Days per week: Not on file    Minutes per session: Not on file  . Stress: Not on file  Relationships  . Social connections:    Talks on phone: Not on file    Gets together: Not on file    Attends religious service: Not on file    Active member of club or organization: Not on file    Attends meetings of clubs or organizations: Not on file  Relationship status: Not on file  . Intimate partner violence:    Fear of current or ex partner: Not on file    Emotionally abused: Not on file    Physically abused: Not on file    Forced sexual activity: Not on file  Other Topics Concern  . Not on file  Social History Narrative   Patient is single with no children.   Patient is right handed.   Patient has hs education.   Patient drinks 1 cup daily.    Current Outpatient Medications on File Prior to Visit  Medication Sig Dispense Refill  . allopurinol (ZYLOPRIM) 100 MG tablet Take 100 mg by mouth daily.    . Ascorbic Acid (VITAMIN C) 1000 MG tablet Take 1,000 mg by mouth daily.    Marland Kitchen aspirin EC 325 MG EC tablet Take 1 tablet (325 mg total) by mouth  daily. 30 tablet 0  . aspirin-acetaminophen-caffeine (EXCEDRIN MIGRAINE) 250-250-65 MG tablet Take 1 tablet by mouth 2 (two) times daily as needed for headache.    Marland Kitchen BIKTARVY 50-200-25 MG TABS tablet Take 1 tablet by mouth every evening.  11  . ibuprofen (ADVIL,MOTRIN) 200 MG tablet Take 400 mg by mouth every 8 (eight) hours as needed (pain).     . rosuvastatin (CRESTOR) 10 MG tablet Take 10 mg by mouth daily.  1  . amLODipine (NORVASC) 10 MG tablet Take 1 tablet by mouth daily.    Marland Kitchen BYSTOLIC 20 MG TABS Take 20 mg by mouth daily.    Marland Kitchen ENTRESTO 97-103 MG Take 1 tablet by mouth 2 (two) times daily.     No current facility-administered medications on file prior to visit.     Review of Systems  Constitutional: Negative for malaise/fatigue and weight loss.  Respiratory: Positive for shortness of breath (On Exertion). Negative for cough and hemoptysis.   Cardiovascular: Positive for palpitations (occasionally while resting) and orthopnea (over the last month has to sleep on 2 pillows). Negative for chest pain, claudication and leg swelling.  Gastrointestinal: Negative for abdominal pain, blood in stool, constipation, heartburn and vomiting.  Genitourinary: Negative for dysuria.  Musculoskeletal: Negative for joint pain and myalgias.  Neurological: Negative for dizziness, focal weakness and headaches.  Endo/Heme/Allergies: Does not bruise/bleed easily.  Psychiatric/Behavioral: Negative for depression. The patient is not nervous/anxious.   All other systems reviewed and are negative.      Objective:  Blood pressure 124/88, pulse 73, height 5' 11" (1.803 m), weight 217 lb (98.4 kg), SpO2 95 %. Body mass index is 30.27 kg/m.  Physical Exam  Constitutional: He appears well-developed and well-nourished. No distress.  HENT:  Head: Atraumatic.  Eyes: Conjunctivae are normal.  Neck: Neck supple. No JVD present. No thyromegaly present.  Cardiovascular: Normal rate, regular rhythm, normal heart  sounds and intact distal pulses. Exam reveals no gallop.  No murmur heard. Pulmonary/Chest: Effort normal and breath sounds normal.  Abdominal: Soft. Bowel sounds are normal.  Musculoskeletal: Normal range of motion.        General: No edema.  Neurological: He is alert.  Skin: Skin is warm and dry.  Psychiatric: He has a normal mood and affect.    CARDIAC STUDIES:   Echocardiogram 07/23/2018: Left ventricle cavity is normal in size. Moderate concentric hypertrophy of the left ventricle. Moderate decrease in LV systolic function with moderate global hypokinesis. Normal diastolic filling pattern. Visual EF is 35%. Calculated EF 42%. Mild tricuspid regurgitation. No evidence of pulmonary hypertension. Structurally normal pulmonic valve with mild  regurgitation. No significant change from 03/30/2017.  Renal artery duplex 03/30/2017: Renal arteries were not well visualized due to significant gas shadowing.  Right kidney appears to be small measuring 7.39 x 3.93 x 3.9 cm and left kidney also appears small measuring 8.3 x 4.36 x 4.3 cm. Normal intrarenal vascular perfusion is noted in both kidneys.  Nuclear Stress test (Outside) 05/16/2014: Impression: Exercise Capacity:  Lexiscan with no exercise. BP Response:  Normal blood pressure response. Clinical Symptoms:  Mild shortness of breath. ECG Impression:  No significant ST segment change suggestive of ischemia. Comparison with Prior Nuclear Study: No previous nuclear study performed. Overall Impression:  Low risk stress nuclear study demonstrating a small region of qualitative without statistically significant mild basal inferior ischemia.   Recent Labs:   BMP Latest Ref Rng & Units 08/30/2018  Glucose 70 - 99 mg/dL 96  BUN 6 - 20 mg/dL 12  Creatinine 0.61 - 1.24 mg/dL 1.12  Sodium 135 - 145 mmol/L 140  Potassium 3.5 - 5.1 mmol/L 3.7  Chloride 98 - 111 mmol/L 106  CO2 22 - 32 mmol/L 21(L)  Calcium 8.9 - 10.3 mg/dL 9.1  eGFR Af Amer  >54m/min  >60   CBC Latest Ref Rng & Units 08/30/2018  WBC 4.0 - 10.5 K/uL 5.1  Hemoglobin 13.0 - 17.0 g/dL 15.7  Hematocrit 39.0 - 52.0 % 49.3  Platelets 150 - 400 K/uL 156   06/14/2018: Hemoglobin A1c 5.2%.  Cholesterol 207, triglycerides 118, HDL 37, LDL 146.  Lab Results  Component Value Date   TSH 0.755 06/27/2014    Assessment & Recommendations:   Essential hypertension - Plan: EKG 12-Lead  Nonischemic cardiomyopathy (HCC)  Hypercholesteremia - Plan: Lipid Panel With LDL/HDL Ratio EKG 12/03/2018: Normal sinus rhythm at rate of 69 bpm, normal axis, nonspecific inferior T-wave abnormality.  Borderline criteria for LVH.  Recommendation: He is presently doing well and is tolerating all his medications. He has been compliant with Bystolic and also Entresto.  Blood pressure is now well controlled. He has finally made an appointment for sleep study.  He is also on statin therapy and needs lipid panel testing.   Unfortunately has not been a significant changes LVEF in Nov 2019 and the presumed is due to uncontrolled hypertension.  I'll see him back in 6 months for follow-up.  JAdrian Prows MD, FAvamar Center For Endoscopyinc3/17/2020, 10:09 PM [facility]. [place] Pager: [phone] Office: [phone] no answer Cell [phone]

## 2018-12-03 ENCOUNTER — Other Ambulatory Visit: Payer: Self-pay

## 2018-12-03 ENCOUNTER — Encounter: Payer: Self-pay | Admitting: Cardiology

## 2018-12-03 ENCOUNTER — Ambulatory Visit: Payer: BC Managed Care – PPO | Admitting: Cardiology

## 2018-12-03 VITALS — BP 124/88 | HR 73 | Ht 71.0 in | Wt 217.0 lb

## 2018-12-03 DIAGNOSIS — E7800 Pure hypercholesterolemia, unspecified: Secondary | ICD-10-CM

## 2018-12-03 DIAGNOSIS — I428 Other cardiomyopathies: Secondary | ICD-10-CM

## 2018-12-03 DIAGNOSIS — I10 Essential (primary) hypertension: Secondary | ICD-10-CM

## 2018-12-04 DIAGNOSIS — B20 Human immunodeficiency virus [HIV] disease: Principal | ICD-10-CM

## 2018-12-04 DIAGNOSIS — I10 Essential (primary) hypertension: Principal | ICD-10-CM

## 2018-12-04 DIAGNOSIS — K0889 Other specified disorders of teeth and supporting structures: Principal | ICD-10-CM

## 2019-01-08 ENCOUNTER — Other Ambulatory Visit: Payer: Self-pay | Admitting: Cardiology

## 2019-01-22 ENCOUNTER — Other Ambulatory Visit: Payer: Self-pay | Admitting: Cardiology

## 2019-01-22 NOTE — Telephone Encounter (Signed)
Please fill

## 2019-03-12 MED ORDER — BIKTARVY 50 MG-200 MG-25 MG TABLET
ORAL_TABLET | 0 refills | 0 days | Status: CP
Start: 2019-03-12 — End: 2019-04-11

## 2019-04-11 MED ORDER — BIKTARVY 50 MG-200 MG-25 MG TABLET
ORAL_TABLET | 0 refills | 0 days | Status: CP
Start: 2019-04-11 — End: 2019-05-16

## 2019-05-16 MED ORDER — BIKTARVY 50 MG-200 MG-25 MG TABLET
ORAL_TABLET | 0 refills | 0 days | Status: CP
Start: 2019-05-16 — End: ?

## 2019-06-02 ENCOUNTER — Other Ambulatory Visit: Payer: Self-pay | Admitting: Cardiology

## 2019-06-02 DIAGNOSIS — B20 Human immunodeficiency virus [HIV] disease: Secondary | ICD-10-CM

## 2019-06-04 MED ORDER — BIKTARVY 50 MG-200 MG-25 MG TABLET
ORAL_TABLET | 0 refills | 0 days | Status: CP
Start: 2019-06-04 — End: 2019-06-18

## 2019-06-07 ENCOUNTER — Other Ambulatory Visit: Payer: Self-pay

## 2019-06-07 ENCOUNTER — Ambulatory Visit (INDEPENDENT_AMBULATORY_CARE_PROVIDER_SITE_OTHER): Payer: BLUE CROSS/BLUE SHIELD | Admitting: Cardiology

## 2019-06-07 ENCOUNTER — Encounter: Payer: Self-pay | Admitting: Cardiology

## 2019-06-07 VITALS — BP 134/88 | HR 81 | Temp 98.0°F | Ht 72.0 in | Wt 208.2 lb

## 2019-06-07 DIAGNOSIS — I10 Essential (primary) hypertension: Secondary | ICD-10-CM | POA: Diagnosis not present

## 2019-06-07 DIAGNOSIS — E7800 Pure hypercholesterolemia, unspecified: Secondary | ICD-10-CM | POA: Diagnosis not present

## 2019-06-07 DIAGNOSIS — I5042 Chronic combined systolic (congestive) and diastolic (congestive) heart failure: Secondary | ICD-10-CM

## 2019-06-07 DIAGNOSIS — I110 Hypertensive heart disease with heart failure: Secondary | ICD-10-CM

## 2019-06-07 DIAGNOSIS — I428 Other cardiomyopathies: Secondary | ICD-10-CM | POA: Diagnosis not present

## 2019-06-07 MED ORDER — AMLODIPINE BESYLATE 10 MG PO TABS: 10.0000 mg | ORAL_TABLET | Freq: Every day | ORAL | 3 refills | Status: DC

## 2019-06-07 MED ORDER — BYSTOLIC 20 MG PO TABS: 1.0000 | ORAL_TABLET | Freq: Every day | ORAL | 3 refills | Status: DC

## 2019-06-07 MED ORDER — ENTRESTO 97-103 MG PO TABS: 1.0000 | ORAL_TABLET | Freq: Two times a day (BID) | ORAL | 3 refills | Status: DC

## 2019-06-07 NOTE — Progress Notes (Signed)
Primary Physician/Referring:  Merrilee Seashore, MD  Patient ID: Justin Cox, male    DOB: [birth year], 38 y.o.   MRN: [id number]  Chief Complaint  Patient presents with  . Follow-up    6 month  . Hypertension  . Hyperlipidemia  . Congestive Heart Failure   HPI:    Justin Cox  is a 38 y.o. AAM with  with nonischemic dilated cardiomyopathy, coronary angiography on 06/30/2014 which revealed normal coronary arteries with ejection fraction of 35-40%, HIV infection, hyperlipidemia, mixed, hypertension, and resistant hypertension. Has had negative renal artery duplex in July 2018.  Comes in for 6 month follow-up visit of cardiomyopathy and also hypertension which has been resistant to control. Tolerating all his medications well. His dyspnea has improved and no PND or orthopnea. He was also referred for sleep study, but did not keep appointment for the same.   No chest pain, dizziness or syncope. No leg edema. He has been compliant with his meds and states he has made lifestyle changes especially with his diet and has lost about 10 Lbs.   Past Medical History:  Diagnosis Date  . Asthma   . CHF (congestive heart failure) ([place])   . Headache   . High cholesterol   . HIV infection ([place])   . Hypertension   . TIA (transient ischemic attack)   . Umbilical hernia    Past Surgical History:  Procedure Laterality Date  . INSERTION OF MESH N/A 08/30/2018   Procedure: INSERTION OF MESH;  Surgeon: Donnie Mesa, MD;  Location: [place];  Service: General;  Laterality: N/A;  . LEFT HEART CATHETERIZATION WITH CORONARY ANGIOGRAM N/A 06/30/2014   Procedure: LEFT HEART CATHETERIZATION WITH CORONARY ANGIOGRAM;  Surgeon: Pixie Casino, MD;  Location: [facility] CATH LAB;  Service: Cardiovascular;  Laterality: N/A;  . UMBILICAL HERNIA REPAIR N/A 08/30/2018   Procedure: HERNIA REPAIR UMBILICAL ADULT ERAS PATHWAY;  Surgeon: Donnie Mesa, MD;  Location: [place];  Service: General;  Laterality: N/A;   Social  History   Socioeconomic History  . Marital status: Single    Spouse name: Not on file  . Number of children: 0  . Years of education: 12th  . Highest education level: Not on file  Occupational History    Employer: [employer]  . Financial resource strain: Not on file  . Food insecurity    Worry: Not on file    Inability: Not on file  . Transportation needs    Medical: Not on file    Non-medical: Not on file  Tobacco Use  . Smoking status: Former Smoker    Years: 6.00    Quit date: 10/20/2010    Years since quitting: 8.6  . Smokeless tobacco: Never Used  . Tobacco comment: smoked about 2 packs per week (when used to smoke)  Substance and Sexual Activity  . Alcohol use: Yes    Alcohol/week: 3.0 standard drinks    Types: 3 Standard drinks or equivalent per week    Comment: Occasional  . Drug use: No  . Sexual activity: Not on file  Lifestyle  . Physical activity    Days per week: Not on file    Minutes per session: Not on file  . Stress: Not on file  Relationships  . Social Herbalist on phone: Not on file    Gets together: Not on file    Attends religious service: Not on file    Active member of club or organization: Not  on file    Attends meetings of clubs or organizations: Not on file    Relationship status: Not on file  . Intimate partner violence    Fear of current or ex partner: Not on file    Emotionally abused: Not on file    Physically abused: Not on file    Forced sexual activity: Not on file  Other Topics Concern  . Not on file  Social History Narrative   Patient is single with no children.   Patient is right handed.   Patient has hs education.   Patient drinks 1 cup daily.   ROS  Review of Systems  Constitution: Positive for malaise/fatigue (due to poor sleeping habits). Negative for chills, decreased appetite and weight gain.  Cardiovascular: Negative for dyspnea on exertion, leg swelling and syncope.  Endocrine: Negative for  cold intolerance.  Hematologic/Lymphatic: Does not bruise/bleed easily.  Musculoskeletal: Negative for joint swelling.  Gastrointestinal: Negative for abdominal pain, anorexia, change in bowel habit, hematochezia and melena.  Neurological: Negative for headaches and light-headedness.  Psychiatric/Behavioral: Negative for depression and substance abuse.  All other systems reviewed and are negative.  Objective   Vitals with BMI 06/07/2019 12/03/2018 08/30/2018  Height 6' 0"  5' 11"  -  Weight 208 lbs 3 oz 217 lbs -  BMI 56.81 27.51 -  Systolic 700 174 944  Diastolic 88 88 967  Pulse 81 73 73    Blood pressure 134/88, pulse 81, temperature 98 F (36.7 C), height 6' (1.829 m), weight 208 lb 3.2 oz (94.4 kg), SpO2 97 %. Body mass index is 28.24 kg/m.   Physical Exam  Constitutional: He appears well-developed and well-nourished. No distress.  HENT:  Head: Atraumatic.  Eyes: Conjunctivae are normal.  Neck: Neck supple. No JVD present. No thyromegaly present.  Cardiovascular: Normal rate, regular rhythm, normal heart sounds and intact distal pulses. Exam reveals no gallop.  No murmur heard. Pulmonary/Chest: Effort normal and breath sounds normal.  Abdominal: Soft. Bowel sounds are normal.  Musculoskeletal: Normal range of motion.  Neurological: He is alert.  Skin: Skin is warm and dry.  Psychiatric: He has a normal mood and affect.   Radiology: No results found.  Laboratory examination:   Recent Labs    08/30/18 0928 06/07/19 1302  NA 140 144  K 3.7 4.2  CL 106 106  CO2 21* 20  GLUCOSE 96 109*  BUN 12 8  CREATININE 1.12 1.06  CALCIUM 9.1 9.9  GFRNONAA >60 89  GFRAA >60 103   CMP Latest Ref Rng & Units 06/07/2019 08/30/2018 10/27/2015  Glucose 65 - 99 mg/dL 109(H) 96 146(H)  BUN 6 - 20 mg/dL 8 12 8   Creatinine 0.76 - 1.27 mg/dL 1.06 1.12 0.86  Sodium 134 - 144 mmol/L 144 140 140  Potassium 3.5 - 5.2 mmol/L 4.2 3.7 3.5  Chloride 96 - 106 mmol/L 106 106 107  CO2 20 - 29  mmol/L 20 21(L) 23  Calcium 8.7 - 10.2 mg/dL 9.9 9.1 8.9  Total Protein 6.0 - 8.5 g/dL 8.3 - -  Total Bilirubin 0.0 - 1.2 mg/dL 0.4 - -  Alkaline Phos 39 - 117 IU/L 65 - -  AST 0 - 40 IU/L 31 - -  ALT 0 - 44 IU/L 43 - -   CBC Latest Ref Rng & Units 08/30/2018 10/27/2015 06/27/2014  WBC 4.0 - 10.5 K/uL 5.1 4.1 4.6  Hemoglobin 13.0 - 17.0 g/dL 15.7 15.7 15.6  Hematocrit 39.0 - 52.0 % 49.3 45.8 43.2  Platelets 150 - 400 K/uL 156 147(L) 188   Lipid Panel     Component Value Date/Time   CHOL 145 06/07/2019 1302   TRIG 269 (H) 06/07/2019 1302   HDL 35 (L) 06/07/2019 1302   CHOLHDL 5.2 04/11/2014 0854   VLDL 57 (H) 04/11/2014 0854   LDLCALC 121 (H) 04/11/2014 0854  LDL           76    06/07/2019    HEMOGLOBIN A1C Lab Results  Component Value Date   HGBA1C 5.6 04/11/2014   MPG 114 04/11/2014   TSH No results for input(s): TSH in the last 8760 hours. Medications   Allergies  Allergen Reactions  . Enalapril Cough     Prior to Admission medications   Medication Sig Start Date End Date Taking? Authorizing Provider  allopurinol (ZYLOPRIM) 100 MG tablet Take 100 mg by mouth daily. 10/06/15  Yes [provider]  amLODipine (NORVASC) 10 MG tablet Take 1 tablet (10 mg total) by mouth daily. 06/07/19  Yes Adrian Prows, MD  Ascorbic Acid (VITAMIN C) 1000 MG tablet Take 1,000 mg by mouth daily.   Yes [provider]  aspirin EC 325 MG EC tablet Take 1 tablet (325 mg total) by mouth daily. 04/11/14  Yes Hongalgi, Lenis Dickinson, MD  aspirin-acetaminophen-caffeine (EXCEDRIN MIGRAINE) 647 838 3506 MG tablet Take 1 tablet by mouth 2 (two) times daily as needed for headache.   Yes [provider]  BIKTARVY 50-200-25 MG TABS tablet Take 1 tablet by mouth every evening. 07/27/18  Yes [provider]  ENTRESTO 97-103 MG Take 1 tablet by mouth 2 (two) times daily. 06/07/19  Yes Adrian Prows, MD  ibuprofen (ADVIL,MOTRIN) 200 MG tablet Take 400 mg by mouth every 8 (eight) hours as  needed (pain).    Yes [provider]  Nebivolol HCl (BYSTOLIC) 20 MG TABS Take 1 tablet (20 mg total) by mouth daily. 06/07/19  Yes Adrian Prows, MD  rosuvastatin (CRESTOR) 10 MG tablet TAKE 1 TABLET BY MOUTH ONCE DAILY IN THE MORNING 06/03/19  Yes Adrian Prows, MD     Current Outpatient Medications  Medication Instructions  . allopurinol (ZYLOPRIM) 100 mg, Oral, Daily  . amLODipine (NORVASC) 10 mg, Oral, Daily  . aspirin-acetaminophen-caffeine (EXCEDRIN MIGRAINE) 250-250-65 MG tablet 1 tablet, Oral, 2 times daily PRN  . aspirin 325 mg, Oral, Daily  . BIKTARVY 50-200-25 MG TABS tablet 1 tablet, Oral, Every evening  . Bystolic 20 mg, Oral, Daily  . ENTRESTO 97-103 MG 1 tablet, Oral, 2 times daily  . ibuprofen (ADVIL) 400 mg, Oral, Every 8 hours PRN  . rosuvastatin (CRESTOR) 10 MG tablet TAKE 1 TABLET BY MOUTH ONCE DAILY IN THE MORNING  . vitamin C 1,000 mg, Oral, Daily    Cardiac Studies:   Nuclear Stress test (Outside) 05/16/2014: Impression: Exercise Capacity:  Lexiscan with no exercise. BP Response:  Normal blood pressure response. Clinical Symptoms:  Mild shortness of breath. ECG Impression:  No significant ST segment change suggestive of ischemia. Comparison with Prior Nuclear Study: No previous nuclear study performed. Overall Impression:  Low risk stress nuclear study demonstrating a small region of qualitative without statistically significant mild basal inferior ischemia.  Coronary angiogram 06/30/2014: Normal coronary arteries, LVEF 35 to 40% with global hypokinesis and basal inferior hypokinesis.  EDP 19 mmHg.  Renal artery duplex 03/30/2017: Renal arteries were not well visualized due to significant gas shadowing.  Right kidney appears to be small measuring 7.39 x 3.93 x 3.9 cm and left  kidney also appears small measuring 8.3 x 4.36 x 4.3 cm. Normal intrarenal vascular perfusion is noted in both kidneys.  Echocardiogram 07/23/2018: Left ventricle cavity is normal in  size. Moderate concentric hypertrophy of the left ventricle. Moderate decrease in LV systolic function with moderate global hypokinesis. Normal diastolic filling pattern. Visual EF is 35%. Calculated EF 42%. Mild tricuspid regurgitation. No evidence of pulmonary hypertension. Structurally normal pulmonic valve with mild regurgitation. No significant change from 03/30/2017.  Assessment     ICD-10-CM   1. Nonischemic cardiomyopathy (HCC)  I42.8 EKG 12-Lead    PCV ECHOCARDIOGRAM COMPLETE    ENTRESTO 97-103 MG  2. 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 Hypercholesteremia  E78.00 Lipid Panel With LDL/HDL Ratio  4. Essential hypertension  I10 CMP14+EGFR    amLODipine (NORVASC) 10 MG tablet    EKG 06/07/2019: Normal sinus rhythm with rate of 71 bpm, left atrial abnormality, left axis deviation, early R wave transition in V2, consider RVH.  Possible delta wave noted in lead II, V4, V5 and V6.   EKG 12/03/2018: Normal sinus rhythm at rate of 69 bpm, normal axis, nonspecific inferior T-wave abnormality.  Borderline criteria for LVH.  Recommendations:   Savvas Roper  is a 38 y.o. AAM with  with nonischemic dilated cardiomyopathy, coronary angiography on 06/30/2014 which revealed normal coronary arteries with ejection fraction of 35-40%, HIV infection, hyperlipidemia, mixed, hypertension, and resistant hypertension. Has had negative renal artery duplex in July 2018, presents here for 6 month office visit, he has made significant lifestyle changes, has lost about 10 pounds in weight since last office visit, blood pressure is now very well controlled.  There is no clinical evidence of heart failure.  He is tolerating statins, Crestor 10 mg daily.  He needs repeat labs including CBC, CMP and lipid profile testing.  TSH previously was normal.  I'll also repeat  echocardiogram to reevaluate his LV systolic function as he is now on guidelines directed medical therapy.  I'd like to see him back in 6 months for follow-up. On maximal dose of metoprolol succinate, his blood pressure was not well-controlled and also he had marked fatigue.  He is presently on Bystolic and is doing well.  I suspect his fatigue is related to his job, he has variable shifts throughout the week.  Doubt he has sleep apnea.  I have reviewed his labs, lipids improved markedly. No change in therapy.   Adrian Prows, MD, [facility] 06/08/2019, 8:47 AM [facility]. [place] Pager: [phone] Office: [phone] If no answer Cell [phone]

## 2019-06-08 LAB — CMP14+EGFR
ALT: 43 IU/L (ref 0–44)
AST: 31 IU/L (ref 0–40)
Albumin/Globulin Ratio: 1.4 (ref 1.2–2.2)
Albumin: 4.9 g/dL (ref 4.0–5.0)
Alkaline Phosphatase: 65 IU/L (ref 39–117)
BUN/Creatinine Ratio: 8 — ABNORMAL LOW (ref 9–20)
BUN: 8 mg/dL (ref 6–20)
Bilirubin Total: 0.4 mg/dL (ref 0.0–1.2)
CO2: 20 mmol/L (ref 20–29)
Calcium: 9.9 mg/dL (ref 8.7–10.2)
Chloride: 106 mmol/L (ref 96–106)
Creatinine, Ser: 1.06 mg/dL (ref 0.76–1.27)
GFR calc Af Amer: 103 mL/min/{1.73_m2} (ref >59)
GFR calc non Af Amer: 89 mL/min/{1.73_m2} (ref >59)
Globulin, Total: 3.4 g/dL (ref 1.5–4.5)
Glucose: 109 mg/dL — ABNORMAL HIGH (ref 65–99)
Potassium: 4.2 mmol/L (ref 3.5–5.2)
Sodium: 144 mmol/L (ref 134–144)
Total Protein: 8.3 g/dL (ref 6.0–8.5)

## 2019-06-08 LAB — LIPID PANEL WITH LDL/HDL RATIO
Cholesterol, Total: 145 mg/dL (ref 100–199)
HDL: 35 mg/dL — ABNORMAL LOW (ref >39)
LDL Chol Calc (NIH): 67 mg/dL (ref 0–99)
LDL/HDL Ratio: 1.9 ratio (ref 0.0–3.6)
Triglycerides: 269 mg/dL — ABNORMAL HIGH (ref 0–149)
VLDL Cholesterol Cal: 43 mg/dL — ABNORMAL HIGH (ref 5–40)

## 2019-06-11 ENCOUNTER — Encounter
Admit: 2019-06-11 | Discharge: 2019-06-11 | Payer: PRIVATE HEALTH INSURANCE | Attending: Infectious Disease | Primary: Infectious Disease

## 2019-06-11 DIAGNOSIS — E785 Hyperlipidemia, unspecified: Secondary | ICD-10-CM

## 2019-06-11 DIAGNOSIS — I10 Essential (primary) hypertension: Secondary | ICD-10-CM

## 2019-06-11 DIAGNOSIS — Z7982 Long term (current) use of aspirin: Secondary | ICD-10-CM

## 2019-06-11 DIAGNOSIS — B20 Human immunodeficiency virus [HIV] disease: Secondary | ICD-10-CM

## 2019-06-11 DIAGNOSIS — E7800 Pure hypercholesterolemia, unspecified: Secondary | ICD-10-CM

## 2019-06-17 DIAGNOSIS — B20 Human immunodeficiency virus [HIV] disease: Secondary | ICD-10-CM

## 2019-06-18 DIAGNOSIS — D013 Carcinoma in situ of anus and anal canal: Secondary | ICD-10-CM

## 2019-06-18 MED ORDER — BIKTARVY 50 MG-200 MG-25 MG TABLET
ORAL_TABLET | 0 refills | 0 days | Status: CP
Start: 2019-06-18 — End: ?

## 2019-06-20 ENCOUNTER — Ambulatory Visit (INDEPENDENT_AMBULATORY_CARE_PROVIDER_SITE_OTHER): Payer: BLUE CROSS/BLUE SHIELD

## 2019-06-20 ENCOUNTER — Other Ambulatory Visit: Payer: Self-pay

## 2019-06-20 DIAGNOSIS — I428 Other cardiomyopathies: Secondary | ICD-10-CM | POA: Diagnosis not present

## 2019-06-20 DIAGNOSIS — I110 Hypertensive heart disease with heart failure: Secondary | ICD-10-CM

## 2019-06-20 DIAGNOSIS — I5042 Chronic combined systolic (congestive) and diastolic (congestive) heart failure: Secondary | ICD-10-CM

## 2019-07-25 ENCOUNTER — Encounter
Admit: 2019-07-25 | Discharge: 2019-07-26 | Payer: PRIVATE HEALTH INSURANCE | Attending: Infectious Disease | Primary: Infectious Disease

## 2019-07-25 DIAGNOSIS — D013 Carcinoma in situ of anus and anal canal: Principal | ICD-10-CM

## 2019-08-22 DIAGNOSIS — B20 Human immunodeficiency virus [HIV] disease: Principal | ICD-10-CM

## 2019-08-27 DIAGNOSIS — B20 Human immunodeficiency virus [HIV] disease: Principal | ICD-10-CM

## 2019-08-27 MED ORDER — BIKTARVY 50 MG-200 MG-25 MG TABLET: tablet | 0 refills | 0 days | Status: AC

## 2019-08-27 MED ORDER — BIKTARVY 50 MG-200 MG-25 MG TABLET
ORAL_TABLET | Freq: Every day | ORAL | 0 refills | 0.00000 days | Status: CP
Start: 2019-08-27 — End: 2019-08-27

## 2019-09-19 ENCOUNTER — Other Ambulatory Visit: Payer: Self-pay | Admitting: Cardiology

## 2019-12-05 ENCOUNTER — Other Ambulatory Visit: Payer: Self-pay

## 2019-12-05 ENCOUNTER — Ambulatory Visit: Payer: BC Managed Care – PPO

## 2019-12-05 ENCOUNTER — Ambulatory Visit: Payer: BLUE CROSS/BLUE SHIELD | Admitting: Cardiology

## 2019-12-05 ENCOUNTER — Ambulatory Visit: Payer: BLUE CROSS/BLUE SHIELD

## 2019-12-05 ENCOUNTER — Encounter: Payer: Self-pay | Admitting: Cardiology

## 2019-12-05 VITALS — BP 123/84 | HR 76 | Temp 97.9°F | Resp 16 | Ht 72.0 in | Wt 209.0 lb

## 2019-12-05 DIAGNOSIS — I110 Hypertensive heart disease with heart failure: Secondary | ICD-10-CM

## 2019-12-05 DIAGNOSIS — I428 Other cardiomyopathies: Secondary | ICD-10-CM

## 2019-12-05 DIAGNOSIS — E782 Mixed hyperlipidemia: Secondary | ICD-10-CM

## 2019-12-05 DIAGNOSIS — R002 Palpitations: Secondary | ICD-10-CM

## 2019-12-05 NOTE — Progress Notes (Signed)
Primary Physician/Referring:  Merrilee Seashore, MD  Patient ID: Justin Cox, male    DOB: 05-29-1981, 39 y.o.   MRN: [id number]  No chief complaint on file.  HPI:    Justin Cox  is a 39 y.o. AAM with  with nonischemic dilated cardiomyopathy, coronary angiography on 06/30/2014 which revealed normal coronary arteries with ejection fraction of 35-40%, HIV infection, hyperlipidemia, mixed, hypertension, and resistant hypertension. Has had negative renal artery duplex in July 2018.  Comes in for 6 month follow-up visit of cardiomyopathy and also hypertension.Tolerating all his medications well. His dyspnea has improved and no PND or orthopnea. No chest pain, dizziness or syncope. No leg edema. He has been compliant with his meds and states he has kept up with good lifestyle changes.  He has noticed frequent episodes of palpitations that last several seconds mostly at rest and occasionally with exertion activity and has been concerned about it that started about 3 weeks ago.  Past Medical History:  Diagnosis Date  . Asthma   . CHF (congestive heart failure) ([place])   . Headache   . High cholesterol   . HIV infection ([place])   . Hypertension   . TIA (transient ischemic attack)   . Umbilical hernia    Past Surgical History:  Procedure Laterality Date  . INSERTION OF MESH N/A 08/30/2018   Procedure: INSERTION OF MESH;  Surgeon: Donnie Mesa, MD;  Location: [place];  Service: General;  Laterality: N/A;  . LEFT HEART CATHETERIZATION WITH CORONARY ANGIOGRAM N/A 06/30/2014   Procedure: LEFT HEART CATHETERIZATION WITH CORONARY ANGIOGRAM;  Surgeon: Pixie Casino, MD;  Location: [facility] CATH LAB;  Service: Cardiovascular;  Laterality: N/A;  . UMBILICAL HERNIA REPAIR N/A 08/30/2018   Procedure: HERNIA REPAIR UMBILICAL ADULT ERAS PATHWAY;  Surgeon: Donnie Mesa, MD;  Location: [place];  Service: General;  Laterality: N/A;   Social History   Tobacco Use  . Smoking status: Former Smoker    Years:  6.00    Quit date: 10/20/2010    Years since quitting: 9.1  . Smokeless tobacco: Never Used  . Tobacco comment: smoked about 2 packs per week (when used to smoke)  Substance Use Topics  . Alcohol use: Yes    Alcohol/week: 3.0 standard drinks    Types: 3 Standard drinks or equivalent per week    Comment: Occasional    Family History  Problem Relation Age of Onset  . Hypertension Mother   . Diabetes Father   . Hyperlipidemia Father   . Hypertension Father   . Hypertension Other   . Hyperlipidemia Other   . Diabetes Other   . Hypertension Brother     ROS  Review of Systems  Constitution: Negative for malaise/fatigue and weight gain.  Cardiovascular: Positive for palpitations. Negative for chest pain, dyspnea on exertion and leg swelling.  Gastrointestinal: Negative for melena.   Objective   Vitals with BMI 12/05/2019 06/07/2019 12/03/2018  Height 6\' 0"  6\' 0"  5\' 11"   Weight 209 lbs 208 lbs 3 oz 217 lbs  BMI 28.34 82.42 35.36  Systolic 144 315 400  Diastolic 84 88 88  Pulse 76 81 73    Blood pressure 123/84, pulse 76, temperature 97.9 F (36.6 C), temperature source Temporal, resp. rate 16, height 6' (1.829 m), weight 209 lb (94.8 kg), SpO2 95 %. Body mass index is 28.35 kg/m.   Physical Exam  Cardiovascular: Normal rate, regular rhythm, normal heart sounds and intact distal pulses. Exam reveals no gallop.  No murmur  heard. No leg edema, no JVD.  Pulmonary/Chest: Effort normal and breath sounds normal.  Abdominal: Soft. Bowel sounds are normal.   Radiology: No results found.  Laboratory examination:   Recent Labs    06/07/19 1302  NA 144  K 4.2  CL 106  CO2 20  GLUCOSE 109*  BUN 8  CREATININE 1.06  CALCIUM 9.9  GFRNONAA 89  GFRAA 103   CMP Latest Ref Rng & Units 06/07/2019 08/30/2018 10/27/2015  Glucose 65 - 99 mg/dL 630(Z) 96 601(U)  BUN 6 - 20 mg/dL 8 12 8   Creatinine 0.76 - 1.27 mg/dL 9.32 3.55  Sodium 134 - 144 mmol/L 144 140 140  Potassium 3.5 -  5.2 mmol/L 4.2 3.7 3.5  Chloride 96 - 106 mmol/L 106 106 107  CO2 20 - 29 mmol/L 20 21(L) 23  Calcium 8.7 - 10.2 mg/dL 9.9 9.1 8.9  Total Protein 6.0 - 8.5 g/dL 8.3 - -  Total Bilirubin 0.0 - 1.2 mg/dL 0.4 - -  Alkaline Phos 39 - 117 IU/L 65 - -  AST 0 - 40 IU/L 31 - -  ALT 0 - 44 IU/L 43 - -   CBC Latest Ref Rng & Units 08/30/2018 10/27/2015 06/27/2014  WBC 4.0 - 10.5 K/uL 5.1 4.1 4.6  Hemoglobin 13.0 - 17.0 g/dL 08/27/2014 20.2 54.2  Hematocrit 39.0 - 52.0 % 49.3 45.8 43.2  Platelets 150 - 400 K/uL 156 147(L) 188   Lipid Panel     Component Value Date/Time   CHOL 145 06/07/2019 1302   TRIG 269 (H) 06/07/2019 1302   HDL 35 (L) 06/07/2019 1302   CHOLHDL 5.2 04/11/2014 0854   VLDL 57 (H) 04/11/2014 0854   LDLCALC 67 06/07/2019 1302       Direct LDL         76    06/07/2019    HEMOGLOBIN A1C Lab Results  Component Value Date   HGBA1C 5.6 04/11/2014   MPG 114 04/11/2014   TSH No results for input(s): TSH in the last 8760 hours. Medications   Allergies  Allergen Reactions  . Enalapril Cough     Current Outpatient Medications  Medication Instructions  . allopurinol (ZYLOPRIM) 100 mg, Oral, Daily  . amLODipine (NORVASC) 10 mg, Oral, Daily  . aspirin 325 mg, Oral, Daily  . BIKTARVY 50-200-25 MG TABS tablet 1 tablet, Oral, Every evening  . Bystolic 20 mg, Oral, Daily  . ENTRESTO 97-103 MG 1 tablet, Oral, 2 times daily  . rosuvastatin (CRESTOR) 10 MG tablet TAKE 1 TABLET BY MOUTH ONCE DAILY IN THE MORNING  . vitamin C 1,000 mg, Oral, Daily    Cardiac Studies:   Nuclear Stress test (Outside) 05/16/2014: Impression: Exercise Capacity:  Lexiscan with no exercise. BP Response:  Normal blood pressure response. Clinical Symptoms:  Mild shortness of breath. ECG Impression:  No significant ST segment change suggestive of ischemia. Comparison with Prior Nuclear Study: No previous nuclear study performed. Overall Impression:  Low risk stress nuclear study demonstrating a small region of  qualitative without statistically significant mild basal inferior ischemia.  Coronary angiogram 06/30/2014: Normal coronary arteries, LVEF 35 to 40% with global hypokinesis and basal inferior hypokinesis.  EDP 19 mmHg.  Renal artery duplex 03/30/2017: Renal arteries were not well visualized due to significant gas shadowing.  Right kidney appears to be small measuring 7.39 x 3.93 x 3.9 cm and left kidney also appears small measuring 8.3 x 4.36 x 4.3 cm. Normal intrarenal vascular perfusion is noted  in both kidneys.  Echocardiogram 06/21/2019:  Normal left ventricular wall thickness. Left ventricle cavity is normal in  size. Normal LV systolic function with visual EF 50-55%. Normal global  wall motion. Doppler evidence of grade I (impaired) diastolic dysfunction,  normal LAP.  Mild tricuspid regurgitation. No evidence of pulmonary hypertension.  Compared to previous study on 07/23/2018, LVEF is improved from 30-35% to 50-55%.  EKG:  EKG 12/05/2019: Normal sinus rhythm at the rate of 68 bpm, left axis deviation, normal interval.  No evidence of ischemia.   No significant change from 06/07/2019, previously noted early R wave transition in V2 to consider RVH probably worsened leads placement letter.  Assessment     ICD-10-CM   1. Nonischemic cardiomyopathy (HCC)  I42.8 EKG 12-Lead    LONG TERM MONITOR-LIVE TELEMETRY (3-14 DAYS)    Holter monitor - 48 hour  2. Hypertensive heart disease with chronic diastolic congestive heart failure (HCC)  I11.0 TSH   I50.32   3. Mixed hyperlipidemia  E78.2 Lipid Panel With LDL/HDL Ratio  4. Palpitations  R00.2 LONG TERM MONITOR-LIVE TELEMETRY (3-14 DAYS)    Holter monitor - 48 hour    TSH    Recommendations:   Justin Cox  is a 39 y.o. AAM with  with nonischemic dilated cardiomyopathy, coronary angiography on 06/30/2014 which revealed normal coronary arteries with ejection fraction of 35-40%, HIV infection, hyperlipidemia, mixed, hypertension, and  resistant hypertension. Has had negative renal artery duplex in July 2018, presents here for 6 month office visit.  I reviewed his echocardiogram, on aggressive medical therapy and blood pressure control, ejection fraction is improved to low normal. There is no clinical evidence of heart failure.  Due to recent onset of palpitations, in view of cardiomyopathy, I will place a Holter monitor for 48 hours, if no significant arrhythmias will extended to 14 days.  I like to see him back in 2 months for follow-up.   He is tolerating statins, Crestor 10 mg daily.  Elevated triglycerides are probably related to underlying HIV disease.  However since he has made lifestyle changes, would like to repeat lipid profile testing to reevaluate his lipid status.  Also in view of palpitations, will obtain TSH.  On maximal dose of metoprolol succinate, his blood pressure was not well-controlled and also he had marked fatigue.  He is presently on Bystolic and is doing well.  I suspect his fatigue is related to his job, he has variable shifts throughout the week.  Doubt he has sleep apnea. OV in 6 months.    Justin Decamp, MD, [facility] 12/05/2019, 10:18 AM [facility] [place] Office: [phone]

## 2019-12-08 ENCOUNTER — Other Ambulatory Visit: Payer: Self-pay | Admitting: Cardiology

## 2019-12-17 ENCOUNTER — Encounter
Admit: 2019-12-17 | Discharge: 2019-12-18 | Payer: PRIVATE HEALTH INSURANCE | Attending: Infectious Disease | Primary: Infectious Disease

## 2019-12-17 MED ORDER — BIKTARVY 50 MG-200 MG-25 MG TABLET
ORAL_TABLET | Freq: Every day | ORAL | 11 refills | 30 days | Status: CP
Start: 2019-12-17 — End: ?

## 2019-12-19 LAB — LIPID PANEL WITH LDL/HDL RATIO
Cholesterol, Total: 159 mg/dL (ref 100–199)
HDL: 40 mg/dL (ref >39)
LDL Chol Calc (NIH): 81 mg/dL (ref 0–99)
LDL/HDL Ratio: 2 ratio (ref 0.0–3.6)
Triglycerides: 230 mg/dL — ABNORMAL HIGH (ref 0–149)
VLDL Cholesterol Cal: 38 mg/dL (ref 5–40)

## 2019-12-19 LAB — TSH: TSH: 0.761 u[IU]/mL (ref 0.450–4.500)

## 2020-01-28 NOTE — Progress Notes (Signed)
Primary Physician/Referring:  Georgianne Fick, MD  Patient ID: Modena Nunnery, male    DOB: August 26, 1981, 39 y.o.   MRN: [id number]  Chief Complaint  Patient presents with  . Cardiomyopathy  . Hypertension  . Palpitations  . Follow-up    2 month   HPI:    Gavyn Ybarra  is a 39 y.o. AAM with  with nonischemic dilated cardiomyopathy, coronary angiography on 06/30/2014 which revealed normal coronary arteries with ejection fraction of 35-40%, HIV infection, hyperlipidemia, mixed, hypertension, and resistant hypertension. Has had negative renal artery duplex in July 2018.  He now presents for follow-up of palpitations and hypertension.  He continues to be fatigued and tired, denies chest pain or shortness of breath, still continues to have occasional skipped beats and palpitations.  He just returned from night shift work, states that he has another work continues to go in 2 hours from now.   Past Medical History:  Diagnosis Date  . Asthma   . CHF (congestive heart failure) (HCC)   . Headache   . High cholesterol   . HIV infection (HCC)   . Hypertension   . TIA (transient ischemic attack)   . Umbilical hernia    Past Surgical History:  Procedure Laterality Date  . INSERTION OF MESH N/A 08/30/2018   Procedure: INSERTION OF MESH;  Surgeon: Manus Rudd, MD;  Location: [facility] OR;  Service: General;  Laterality: N/A;  . LEFT HEART CATHETERIZATION WITH CORONARY ANGIOGRAM N/A 06/30/2014   Procedure: LEFT HEART CATHETERIZATION WITH CORONARY ANGIOGRAM;  Surgeon: [name], MD;  Location: [facility] CATH LAB;  Service: Cardiovascular;  Laterality: N/A;  . UMBILICAL HERNIA REPAIR N/A 08/30/2018   Procedure: HERNIA REPAIR UMBILICAL ADULT ERAS PATHWAY;  Surgeon: Manus Rudd, MD;  Location: [facility] OR;  Service: General;  Laterality: N/A;   Social History   Tobacco Use  . Smoking status: Former Smoker    Years: 6.00    Quit date: 10/20/2010    Years since quitting: 9.2  . Smokeless tobacco: Never  Used  . Tobacco comment: smoked about 2 packs per week (when used to smoke)  Substance Use Topics  . Alcohol use: Yes    Alcohol/week: 3.0 standard drinks    Types: 3 Standard drinks or equivalent per week    Comment: Occasional    Family History  Problem Relation Age of Onset  . Hypertension Mother   . Diabetes Father   . Hyperlipidemia Father   . Hypertension Father   . Hypertension Other   . Hyperlipidemia Other   . Diabetes Other   . Hypertension Brother     Marital Status: Single ROS  Review of Systems  Constitution: Negative for malaise/fatigue and weight gain.  Cardiovascular: Positive for palpitations. Negative for chest pain, dyspnea on exertion and leg swelling.  Gastrointestinal: Negative for melena.   Objective   Vitals with BMI 01/31/2020 12/05/2019 06/07/2019  Height 6\' 0"  6\' 0"  6\' 0"   Weight 206 lbs 209 lbs 208 lbs 3 oz  BMI 27.93 28.34 28.23  Systolic 130 123  Diastolic 80 84 88  Pulse 74 76 81    Blood pressure 130/80, pulse 74, temperature 98.5 F (36.9 C), temperature source Temporal, resp. rate 15, height 6' (1.829 m), weight 206 lb (93.4 kg), SpO2 96 %. Body mass index is 27.94 kg/m.   Physical Exam  Cardiovascular: Normal rate, regular rhythm, normal heart sounds and intact distal pulses. Exam reveals no gallop.  No murmur heard. No leg edema, no  JVD.  Pulmonary/Chest: Effort normal and breath sounds normal.  Abdominal: Soft. Bowel sounds are normal.   Radiology: No results found.  Laboratory examination:   Recent Labs    06/07/19 1302  NA 144  K 4.2  CL 106  CO2 20  GLUCOSE 109*  BUN 8  CREATININE 1.06  CALCIUM 9.9  GFRNONAA 89  GFRAA 103   CMP Latest Ref Rng & Units 06/07/2019 08/30/2018 10/27/2015  Glucose 65 - 99 mg/dL 109(H) 96 146(H)  BUN 6 - 20 mg/dL 8 12 8   Creatinine 0.76 - 1.27 mg/dL 1.06 1.12 0.86  Sodium 134 - 144 mmol/L 144 140 140  Potassium 3.5 - 5.2 mmol/L 4.2 3.7 3.5  Chloride 96 - 106 mmol/L 106 106 107    CO2 20 - 29 mmol/L 20 21(L) 23  Calcium 8.7 - 10.2 mg/dL 9.9 9.1 8.9  Total Protein 6.0 - 8.5 g/dL 8.3 - -  Total Bilirubin 0.0 - 1.2 mg/dL 0.4 - -  Alkaline Phos 39 - 117 IU/L 65 - -  AST 0 - 40 IU/L 31 - -  ALT 0 - 44 IU/L 43 - -   CBC Latest Ref Rng & Units 08/30/2018 10/27/2015 06/27/2014  WBC 4.0 - 10.5 K/uL 5.1 4.1 4.6  Hemoglobin 13.0 - 17.0 g/dL 15.7 15.7 15.6  Hematocrit 39.0 - 52.0 % 49.3 45.8 43.2  Platelets 150 - 400 K/uL 156 147(L) 188   Lipid Panel     Component Value Date/Time   CHOL 159 12/18/2019 1156   TRIG 230 (H) 12/18/2019 1156   HDL 40 12/18/2019 1156   CHOLHDL 5.2 04/11/2014 0854   VLDL 57 (H) 04/11/2014 0854   LDLCALC 81 12/18/2019 1156       Direct LDL         76    06/07/2019    HEMOGLOBIN A1C Lab Results  Component Value Date   HGBA1C 5.6 04/11/2014   MPG 114 04/11/2014   TSH Recent Labs    12/18/19 1156  TSH 0.761   External Labs:  12/18/2019: Glucose 96 65 - 99 mg/dL  BUN 8 6 - 20 mg/dL  Creatinine 0.94 0.76 - 1.27 mg/dL  GFR MDRD Non Af Amer 102 >59 mL/min/1.73  GFR MDRD Af Amer 118 >59 mL/min/1.73  BUN/Creatinine Ratio 9 9 - 20   Sodium 141 134 - 144 mmol/L  Potassium 4.2 3.5 - 5.2 mmol/L  Chloride 103 96 - 106 mmol/L  CO2 22 20 - 29 mmol/L  Calcium 9.6    WBC 4.7 3.4 - 10.8 x10E3/uL  RBC 4.62 4.14 - 5.80 x10E6/uL  HGB 16.0 13.0 - 17.7 g/dL  HCT 45.0 37.5 - 51.0 %  MCV 97 79 - 97 fL  MCH 34.6 (H) 26.6 - 33.0 pg  MCHC 35.6 31.5 - 35.7 g/dL  RDW 12.4 11.6 - 15.4 %  Platelet 157    Medications and allergies   Allergies  Allergen Reactions  . Enalapril Cough     Current Outpatient Medications  Medication Instructions  . allopurinol (ZYLOPRIM) 100 mg, Oral, Daily  . amLODipine (NORVASC) 10 mg, Oral, Daily  . aspirin 325 mg, Oral, Daily  . BIKTARVY 50-200-25 MG TABS tablet 1 tablet, Oral, Every evening  . Bystolic 20 mg, Oral, Daily  . ENTRESTO 97-103 MG 1 tablet, Oral, 2 times daily  . rosuvastatin (CRESTOR) 10 MG  tablet TAKE 1 TABLET BY MOUTH ONCE DAILY IN THE MORNING  . vitamin C 1,000 mg, Oral, Daily   Radiology  No results found.  Cardiac Studies:   Nuclear Stress test (Outside) 05/16/2014: Impression: Exercise Capacity:  Lexiscan with no exercise. BP Response:  Normal blood pressure response. Clinical Symptoms:  Mild shortness of breath. ECG Impression:  No significant ST segment change suggestive of ischemia. Comparison with Prior Nuclear Study: No previous nuclear study performed. Overall Impression:  Low risk stress nuclear study demonstrating a small region of qualitative without statistically significant mild basal inferior ischemia.  Coronary angiogram 06/30/2014: Normal coronary arteries, LVEF 35 to 40% with global hypokinesis and basal inferior hypokinesis.  EDP 19 mmHg.  Renal artery duplex 03/30/2017: Renal arteries were not well visualized due to significant gas shadowing.  Right kidney appears to be small measuring 7.39 x 3.93 x 3.9 cm and left kidney also appears small measuring 8.3 x 4.36 x 4.3 cm. Normal intrarenal vascular perfusion is noted in both kidneys.  Echocardiogram 06/21/2019:  Normal left ventricular wall thickness. Left ventricle cavity is normal in  size. Normal LV systolic function with visual EF 50-55%. Normal global  wall motion. Doppler evidence of grade I (impaired) diastolic dysfunction,  normal LAP.  Mild tricuspid regurgitation. No evidence of pulmonary hypertension.  Compared to previous study on 07/23/2018, LVEF is improved from 30-35% to 50-55%.  Extended Holter Monitor 15 day 12/05/2019: Predominant rhythm is normal sinus rhythm.  Minimum heart rate 64 bpm, maximum heart rate 116 bpm. Ventricular ectopy = 57.  0 ventricular pairs and 0 ventricular runs. Rare PACs.  No atrial fibrillation.  Patient activated events (2) reveal PVC.  EKG:  12/05/2019: Normal sinus rhythm at the rate of 68 bpm, left axis deviation, normal interval.  No evidence of  ischemia.   No significant change from 06/07/2019, previously noted early R wave transition in V2 to consider RVH probably lead placement.  Assessment     ICD-10-CM   1. Nonischemic cardiomyopathy (HCC)  I42.8   2. Primary hypertension  I10   3. Palpitations  R00.2     No orders of the defined types were placed in this encounter.  There are no discontinued medications.   Recommendations:   Kyian Obst  is a 39 y.o. AAM with  with nonischemic dilated cardiomyopathy, coronary angiography on 06/30/2014 which revealed normal coronary arteries with ejection fraction of 35-40%, HIV infection, hyperlipidemia, mixed, hypertension, and resistant hypertension. Has had negative renal artery duplex in July 2018, presents here for 6 week office visit for hypertension and also palpitations, underwent event monitoring.  His symptoms are related to rare PACs and PVCs, he has hypertensive heart disease, I reviewed the data with the patient, when he came into the office his blood pressure was elevated.  She has real bad work hours, he worked all night and 3 hours later he is going for a second shift job in the morning.  He looks fatigued and tired.  His blood pressure was elevated upon presentation which normalized after 30 minutes of resting.  I have recommended that he change his position at the workplace to have enough sleep.  I am concerned that he may develop recurrence of cardiomyopathy.  His ejection fraction is remained stable over the past 2 years with no clinical evidence of heart failure.  I will send a letter to his supervisors to make changes to his work hours. I suspect his fatigue is related to his job, he has variable shifts throughout the week.  Doubt he has sleep apnea.   He is tolerating statins, Crestor 10 mg daily.  Elevated triglycerides  are probably related to underlying HIV disease.  However since he has made lifestyle changes, would like to repeat lipid profile testing to reevaluate his  lipid status.  Also in view of palpitations. OV in 6 months.    Yates Decamp, MD, [facility] 01/31/2020, 9:54 AM [facility] [place] Pager: [phone] Office: [phone]

## 2020-01-31 ENCOUNTER — Other Ambulatory Visit: Payer: Self-pay

## 2020-01-31 ENCOUNTER — Ambulatory Visit: Payer: BLUE CROSS/BLUE SHIELD | Admitting: Cardiology

## 2020-01-31 ENCOUNTER — Encounter: Payer: Self-pay | Admitting: Cardiology

## 2020-01-31 VITALS — BP 130/80 | HR 74 | Temp 98.5°F | Resp 15 | Ht 72.0 in | Wt 206.0 lb

## 2020-01-31 DIAGNOSIS — R002 Palpitations: Secondary | ICD-10-CM

## 2020-01-31 DIAGNOSIS — I428 Other cardiomyopathies: Secondary | ICD-10-CM

## 2020-01-31 DIAGNOSIS — I10 Essential (primary) hypertension: Secondary | ICD-10-CM

## 2020-02-02 ENCOUNTER — Encounter: Payer: Self-pay | Admitting: Cardiology

## 2020-06-02 ENCOUNTER — Encounter
Admit: 2020-06-02 | Discharge: 2020-06-03 | Payer: PRIVATE HEALTH INSURANCE | Attending: Infectious Disease | Primary: Infectious Disease

## 2020-06-02 DIAGNOSIS — B20 Human immunodeficiency virus [HIV] disease: Principal | ICD-10-CM

## 2020-06-02 DIAGNOSIS — Z113 Encounter for screening for infections with a predominantly sexual mode of transmission: Principal | ICD-10-CM

## 2020-06-09 ENCOUNTER — Other Ambulatory Visit: Payer: Self-pay | Admitting: Cardiology

## 2020-06-23 ENCOUNTER — Other Ambulatory Visit: Payer: Self-pay | Admitting: Cardiology

## 2020-06-23 DIAGNOSIS — I110 Hypertensive heart disease with heart failure: Secondary | ICD-10-CM

## 2020-06-26 ENCOUNTER — Other Ambulatory Visit: Payer: Self-pay | Admitting: Cardiology

## 2020-06-26 DIAGNOSIS — I428 Other cardiomyopathies: Secondary | ICD-10-CM

## 2020-07-28 ENCOUNTER — Other Ambulatory Visit: Payer: Self-pay | Admitting: Cardiology

## 2020-07-28 DIAGNOSIS — I110 Hypertensive heart disease with heart failure: Secondary | ICD-10-CM

## 2020-07-28 DIAGNOSIS — I428 Other cardiomyopathies: Secondary | ICD-10-CM

## 2020-08-03 ENCOUNTER — Ambulatory Visit: Payer: BC Managed Care – PPO | Admitting: Cardiology

## 2020-08-03 ENCOUNTER — Encounter: Payer: Self-pay | Admitting: Cardiology

## 2020-08-03 ENCOUNTER — Other Ambulatory Visit: Payer: Self-pay

## 2020-08-03 ENCOUNTER — Ambulatory Visit: Payer: BLUE CROSS/BLUE SHIELD | Admitting: Cardiology

## 2020-08-03 VITALS — BP 133/92 | HR 65 | Resp 16 | Ht 72.0 in | Wt 214.0 lb

## 2020-08-03 DIAGNOSIS — R0683 Snoring: Secondary | ICD-10-CM

## 2020-08-03 DIAGNOSIS — I110 Hypertensive heart disease with heart failure: Secondary | ICD-10-CM

## 2020-08-03 DIAGNOSIS — I428 Other cardiomyopathies: Secondary | ICD-10-CM

## 2020-08-03 DIAGNOSIS — E782 Mixed hyperlipidemia: Secondary | ICD-10-CM

## 2020-08-03 DIAGNOSIS — I10 Essential (primary) hypertension: Secondary | ICD-10-CM

## 2020-08-03 MED ORDER — CHLORTHALIDONE 25 MG PO TABS: 25.0000 mg | ORAL_TABLET | ORAL | 2 refills | Status: DC

## 2020-08-03 NOTE — Progress Notes (Signed)
Primary Physician/Referring:  Merrilee Seashore, MD  Patient ID: Justin Cox, male    DOB: 1981/06/23, 39 y.o.   MRN: [id number]  Chief Complaint  Patient presents with  . Hypertension  . Cardiomyopathy  . Follow-up    6 month   HPI:    Justin Cox  is a 39 y.o. AAM with  with nonischemic dilated cardiomyopathy, coronary angiography on 06/30/2014 which revealed normal coronary arteries with ejection fraction of 35-40%, HIV infection, hyperlipidemia, mixed, hypertension, and resistant hypertension. Has had negative renal artery duplex in July 2018.  He now presents for follow-up of palpitations and hypertension.  He continues to be fatigued and tired, denies chest pain or shortness of breath, still continues to have occasional skipped beats and palpitations.  Denies dyspnea, chest pain.  No recent weight changes.  States that he has been careful with his diet.  Past Medical History:  Diagnosis Date  . Asthma   . CHF (congestive heart failure) ([place])   . Headache   . High cholesterol   . HIV infection ([place])   . Hypertension   . TIA (transient ischemic attack)   . Umbilical hernia    Past Surgical History:  Procedure Laterality Date  . INSERTION OF MESH N/A 08/30/2018   Procedure: INSERTION OF MESH;  Surgeon: Donnie Mesa, MD;  Location: [place];  Service: General;  Laterality: N/A;  . LEFT HEART CATHETERIZATION WITH CORONARY ANGIOGRAM N/A 06/30/2014   Procedure: LEFT HEART CATHETERIZATION WITH CORONARY ANGIOGRAM;  Surgeon: Pixie Casino, MD;  Location: [facility] CATH LAB;  Service: Cardiovascular;  Laterality: N/A;  . UMBILICAL HERNIA REPAIR N/A 08/30/2018   Procedure: HERNIA REPAIR UMBILICAL ADULT ERAS PATHWAY;  Surgeon: Donnie Mesa, MD;  Location: [place];  Service: General;  Laterality: N/A;   Social History   Tobacco Use  . Smoking status: Former Smoker    Years: 6.00    Types: Cigarettes    Quit date: 10/20/2010    Years since quitting: 9.7  . Smokeless tobacco: Never  Used  . Tobacco comment: smoked about 2 packs per week (when used to smoke)  Substance Use Topics  . Alcohol use: Yes    Alcohol/week: 3.0 standard drinks    Types: 3 Standard drinks or equivalent per week    Comment: Occasional    Family History  Problem Relation Age of Onset  . Hypertension Mother   . Diabetes Father   . Hyperlipidemia Father   . Hypertension Father   . Hypertension Other   . Hyperlipidemia Other   . Diabetes Other   . Hypertension Brother     Marital Status: Single ROS  Review of Systems  Constitutional: Positive for malaise/fatigue. Negative for weight gain.  Cardiovascular: Positive for palpitations. Negative for chest pain, dyspnea on exertion and leg swelling.  Respiratory: Positive for snoring.   Gastrointestinal: Negative for melena.   Objective   Vitals with BMI 08/03/2020 08/03/2020 01/31/2020  Height - 6' 0"  6' 0"   Weight - 214 lbs 206 lbs  BMI - 38.25 05.39  Systolic 767 341 937  Diastolic 92 98 80  Pulse 65 69 74    Blood pressure (!) 133/92, pulse 65, resp. rate 16, height 6' (1.829 m), weight 214 lb (97.1 kg), SpO2 98 %. Body mass index is 29.02 kg/m.   Physical Exam Cardiovascular:     Rate and Rhythm: Normal rate and regular rhythm.     Pulses: Intact distal pulses.     Heart sounds: Normal heart sounds.  No murmur heard.  No gallop.      Comments: No leg edema, no JVD. Pulmonary:     Effort: Pulmonary effort is normal.     Breath sounds: Normal breath sounds.  Abdominal:     General: Bowel sounds are normal.     Palpations: Abdomen is soft.    Radiology: No results found.  Laboratory examination:   No results for input(s): NA, K, CL, CO2, GLUCOSE, BUN, CREATININE, CALCIUM, GFRNONAA, GFRAA in the last 8760 hours. CMP Latest Ref Rng & Units 06/07/2019 08/30/2018 10/27/2015  Glucose 65 - 99 mg/dL 109(H) 96 146(H)  BUN 6 - 20 mg/dL 8 12 8   Creatinine 0.76 - 1.27 mg/dL 1.06 1.12 0.86  Sodium 134 - 144 mmol/L 144 140 140    Potassium 3.5 - 5.2 mmol/L 4.2 3.7 3.5  Chloride 96 - 106 mmol/L 106 106 107  CO2 20 - 29 mmol/L 20 21(L) 23  Calcium 8.7 - 10.2 mg/dL 9.9 9.1 8.9  Total Protein 6.0 - 8.5 g/dL 8.3 - -  Total Bilirubin 0.0 - 1.2 mg/dL 0.4 - -  Alkaline Phos 39 - 117 IU/L 65 - -  AST 0 - 40 IU/L 31 - -  ALT 0 - 44 IU/L 43 - -   CBC Latest Ref Rng & Units 08/30/2018 10/27/2015 06/27/2014  WBC 4.0 - 10.5 K/uL 5.1 4.1 4.6  Hemoglobin 13.0 - 17.0 g/dL 15.7 15.7 15.6  Hematocrit 39 - 52 % 49.3 45.8 43.2  Platelets 150 - 400 K/uL 156 147(L) 188   Lipid Panel     Component Value Date/Time   CHOL 159 12/18/2019 1156   TRIG 230 (H) 12/18/2019 1156   HDL 40 12/18/2019 1156   CHOLHDL 5.2 04/11/2014 0854   VLDL 57 (H) 04/11/2014 0854   LDLCALC 81 12/18/2019 1156       Direct LDL         76    06/07/2019    HEMOGLOBIN A1C Lab Results  Component Value Date   HGBA1C 5.6 04/11/2014   MPG 114 04/11/2014   TSH Recent Labs    12/18/19 1156  TSH 0.761   External Labs:  12/18/2019: Glucose 96 65 - 99 mg/dL  BUN 8 6 - 20 mg/dL  Creatinine 0.94 0.76 - 1.27 mg/dL  GFR MDRD Non Af Amer 102 >59 mL/min/1.73  GFR MDRD Af Amer 118 >59 mL/min/1.73  BUN/Creatinine Ratio 9 9 - 20   Sodium 141 134 - 144 mmol/L  Potassium 4.2 3.5 - 5.2 mmol/L  Chloride 103 96 - 106 mmol/L  CO2 22 20 - 29 mmol/L  Calcium 9.6    WBC 4.7 3.4 - 10.8 x10E3/uL  RBC 4.62 4.14 - 5.80 x10E6/uL  HGB 16.0 13.0 - 17.7 g/dL  HCT 45.0 37.5 - 51.0 %  MCV 97 79 - 97 fL  MCH 34.6 (H) 26.6 - 33.0 pg  MCHC 35.6 31.5 - 35.7 g/dL  RDW 12.4 11.6 - 15.4 %  Platelet 157    Medications and allergies   Allergies  Allergen Reactions  . Enalapril Cough     Current Outpatient Medications  Medication Instructions  . allopurinol (ZYLOPRIM) 100 mg, Oral, Daily  . amLODipine (NORVASC) 10 mg, Oral, Daily  . aspirin 325 mg, Oral, Daily  . BIKTARVY 50-200-25 MG TABS tablet 1 tablet, Oral, Every evening  . BYSTOLIC 20 MG TABS Take 1 tablet by  mouth once daily  . chlorthalidone (HYGROTON) 25 mg, Oral, BH-each morning  . ENTRESTO 97-103  MG Take 1 tablet by mouth twice daily  . rosuvastatin (CRESTOR) 10 MG tablet TAKE 1 TABLET BY MOUTH ONCE DAILY IN THE MORNING  . vitamin C 1,000 mg, Oral, Daily   Radiology   No results found.  Cardiac Studies:   Nuclear Stress test (Outside) 05/16/2014: Impression: Exercise Capacity:  Lexiscan with no exercise. BP Response:  Normal blood pressure response. Clinical Symptoms:  Mild shortness of breath. ECG Impression:  No significant ST segment change suggestive of ischemia. Comparison with Prior Nuclear Study: No previous nuclear study performed. Overall Impression:  Low risk stress nuclear study demonstrating a small region of qualitative without statistically significant mild basal inferior ischemia.  Coronary angiogram 06/30/2014: Normal coronary arteries, LVEF 35 to 40% with global hypokinesis and basal inferior hypokinesis.  EDP 19 mmHg.  Renal artery duplex 03/30/2017: Renal arteries were not well visualized due to significant gas shadowing.  Right kidney appears to be small measuring 7.39 x 3.93 x 3.9 cm and left kidney also appears small measuring 8.3 x 4.36 x 4.3 cm. Normal intrarenal vascular perfusion is noted in both kidneys.  Echocardiogram 06/21/2019:  Normal left ventricular wall thickness. Left ventricle cavity is normal in  size. Normal LV systolic function with visual EF 50-55%. Normal global  wall motion. Doppler evidence of grade I (impaired) diastolic dysfunction,  normal LAP.  Mild tricuspid regurgitation. No evidence of pulmonary hypertension.  Compared to previous study on 07/23/2018, LVEF is improved from 30-35% to 50-55%.  Extended Holter Monitor 15 day 12/05/2019: Predominant rhythm is normal sinus rhythm.  Minimum heart rate 64 bpm, maximum heart rate 116 bpm. Ventricular ectopy = 57.  0 ventricular pairs and 0 ventricular runs. Rare PACs.  No atrial fibrillation.   Patient activated events (2) reveal PVC.  EKG:  EKG 08/03/2020: Normal sinus rhythm at rate of 62 bpm, left atrial enlargement, leftward axis, nonspecific T wave abnormality.   No significant change from 12/05/2019.  Assessment     ICD-10-CM   1. Nonischemic cardiomyopathy ([place])  I42.8 EKG 12-Lead    Ambulatory referral to Sleep Studies    PCV ECHOCARDIOGRAM COMPLETE  2. Primary hypertension  I10 CMP14+EGFR  3. Hypertensive heart disease with chronic diastolic congestive heart failure (HCC)  I11.0 Ambulatory referral to Sleep Studies   I50.32 chlorthalidone (HYGROTON) 25 MG tablet    PCV ECHOCARDIOGRAM COMPLETE  4. Snoring  R06.83 Ambulatory referral to Sleep Studies  5. Mixed hyperlipidemia  E78.2 Lipid Panel With LDL/HDL Ratio    Meds ordered this encounter  Medications  . chlorthalidone (HYGROTON) 25 MG tablet    Sig: Take 1 tablet (25 mg total) by mouth every morning.    Dispense:  30 tablet    Refill:  2   There are no discontinued medications.  Recommendations:   Justin Cox  is a 39 y.o. AAM with  with nonischemic dilated cardiomyopathy, coronary angiography on 06/30/2014 which revealed normal coronary arteries with ejection fraction of 35-40%, HIV infection, hyperlipidemia, mixed, hypertension, and resistant hypertension. Has had negative renal artery duplex in July 2018.  His ejection fraction is remained stable over the past 2 years with no clinical evidence of heart failure.  However I would like to repeat his echocardiogram to reevaluate his LV systolic function as it has been a year since prior echocardiogram.  Presence of daytime somnolence, difficult to control hypertensin and loud snoring, I will refer him to be evaluated for sleep study.  I have added chlorthalidone 25 mg in the morning.  Will  obtain labs in 2 weeks including lipid profile testing and CMP.  He is tolerating statins, Crestor 10 mg daily.  Elevated triglycerides are probably related to underlying HIV  disease.  However since he has made lifestyle changes, would like to repeat lipid profile testing to reevaluate his lipid status.  I would like to see him back in 6 weeks for follow-up.    Happy birthday !!   Adrian Prows, MD, [facility] 08/03/2020, 2:39 PM Office: [phone] Pager: [phone]

## 2020-08-10 ENCOUNTER — Other Ambulatory Visit: Payer: Self-pay

## 2020-08-10 ENCOUNTER — Ambulatory Visit: Payer: BC Managed Care – PPO

## 2020-08-10 DIAGNOSIS — I110 Hypertensive heart disease with heart failure: Secondary | ICD-10-CM

## 2020-08-10 DIAGNOSIS — I428 Other cardiomyopathies: Secondary | ICD-10-CM

## 2020-08-18 ENCOUNTER — Other Ambulatory Visit: Payer: Self-pay | Admitting: Cardiology

## 2020-08-18 DIAGNOSIS — I10 Essential (primary) hypertension: Secondary | ICD-10-CM

## 2020-09-02 LAB — CMP14+EGFR
ALT: 31 IU/L (ref 0–44)
AST: 22 IU/L (ref 0–40)
Albumin/Globulin Ratio: 1.2 (ref 1.2–2.2)
Albumin: 4.6 g/dL (ref 4.0–5.0)
Alkaline Phosphatase: 57 IU/L (ref 44–121)
BUN/Creatinine Ratio: 16 (ref 9–20)
BUN: 18 mg/dL (ref 6–20)
Bilirubin Total: 0.3 mg/dL (ref 0.0–1.2)
CO2: 25 mmol/L (ref 20–29)
Calcium: 9.8 mg/dL (ref 8.7–10.2)
Chloride: 95 mmol/L — ABNORMAL LOW (ref 96–106)
Creatinine, Ser: 1.15 mg/dL (ref 0.76–1.27)
GFR calc Af Amer: 92 mL/min/{1.73_m2} (ref >59)
GFR calc non Af Amer: 80 mL/min/{1.73_m2} (ref >59)
Globulin, Total: 3.9 g/dL (ref 1.5–4.5)
Glucose: 113 mg/dL — ABNORMAL HIGH (ref 65–99)
Potassium: 3.9 mmol/L (ref 3.5–5.2)
Sodium: 134 mmol/L (ref 134–144)
Total Protein: 8.5 g/dL (ref 6.0–8.5)

## 2020-09-02 LAB — LIPID PANEL WITH LDL/HDL RATIO
Cholesterol, Total: 176 mg/dL (ref 100–199)
HDL: 30 mg/dL — ABNORMAL LOW (ref >39)
LDL Chol Calc (NIH): 42 mg/dL (ref 0–99)
LDL/HDL Ratio: 1.4 ratio (ref 0.0–3.6)
Triglycerides: 747 mg/dL (ref 0–149)
VLDL Cholesterol Cal: 104 mg/dL — ABNORMAL HIGH (ref 5–40)

## 2020-09-02 NOTE — Progress Notes (Signed)
Marked elevation in triglycerides, probably related to his poor diet. Avoid excessive carbohydrates/starch, ice tea that is sugared, soft drinks.  Will address during his next office visit soon.

## 2020-09-03 ENCOUNTER — Other Ambulatory Visit: Payer: Self-pay

## 2020-09-03 ENCOUNTER — Encounter: Payer: Self-pay | Admitting: Neurology

## 2020-09-03 ENCOUNTER — Ambulatory Visit: Payer: BC Managed Care – PPO | Admitting: Neurology

## 2020-09-03 VITALS — BP 91/55 | HR 77 | Ht 72.0 in | Wt 210.0 lb

## 2020-09-03 DIAGNOSIS — R0683 Snoring: Secondary | ICD-10-CM | POA: Diagnosis not present

## 2020-09-03 DIAGNOSIS — G4700 Insomnia, unspecified: Secondary | ICD-10-CM

## 2020-09-03 DIAGNOSIS — R002 Palpitations: Secondary | ICD-10-CM

## 2020-09-03 DIAGNOSIS — I10 Essential (primary) hypertension: Secondary | ICD-10-CM | POA: Diagnosis not present

## 2020-09-03 DIAGNOSIS — G4726 Circadian rhythm sleep disorder, shift work type: Secondary | ICD-10-CM | POA: Insufficient documentation

## 2020-09-03 DIAGNOSIS — M2619 Other specified anomalies of jaw-cranial base relationship: Secondary | ICD-10-CM | POA: Diagnosis not present

## 2020-09-03 NOTE — Addendum Note (Signed)
Addended by: Melvyn Novas on: 09/03/2020 11:04 AM   Modules accepted: Orders

## 2020-09-03 NOTE — Patient Instructions (Signed)

## 2020-09-03 NOTE — Progress Notes (Signed)
SLEEP MEDICINE CLINIC    Provider:  Melvyn Novas, MD  Primary Care Physician:  Georgianne Fick, MD [address] [place] [address]     Referring Provider: Dr. Jacinto Halim, MD         Chief Complaint according to patient   Patient presents with:    . New Patient (Initial Visit)           HISTORY OF PRESENT ILLNESS:  Justin Cox is a 39 y.o. year old African American male patient and seen upon referral on 09/03/2020 from Dr. Jacinto Halim, MD for a sleep medicine CONSULTATION.  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 I was told I snore".    Justin Cox is a [place] American male with a medical history of Asthma, CHF (congestive heart failure) (HCC) with non-ischemic dilated cardiomyopathy, Headache, High cholesterol, HIV infection (HCC), Hypertension, TIA (transient ischemic attack), and Umbilical hernia.   Sleep relevant medical history: Insomnia, onset at least 10 years ago. Night terrors , until age 16.  Family medical /sleep history: No other family member on CPAP with OSA, some older cousins with insomnia, no sleep walkers.    Social history:  Patient is working as Production designer, theatre/television/film at [employer]-  and lives in a household alone. The patient currently works/in late day shifts -alternating with early shifts- up to 2 PM a up to 11 PM. Tobacco use; quit 10 years ago . ETOH use ; socially. Caffeine intake in form of Coffee(/) Soda( pepsi 4-6 a day) Tea ( dinner - lipton sweet tea) ,no energy drinks. Regular exercise in form of walking.     Sleep habits are as follows: The patient's dinner time is variable -between 4-8  PM. The patient goes to bed at 11 PM and continues to struggle to sleep for one hour- sleep for intervals of 1-2  hours, wakes for unknown reasons, dreams may play a role.    The preferred sleep position is side and supine , with the  support of 2 pillows. The bedroom is cool, quiet and dark.  Dreams are reportedly frequent/vivid. Some of them are nightmares- palpitations, emotionally upsetting.   4 AM is the usual rise time for early shifts and 9 AM for the late shift-. The patient wakes up spontaneously on late shift days- with an alarm on early shift days.  He reports not feeling refreshed or restored in AM, with symptoms such as dry mouth, but no morning headaches - and residual fatigue. He feels stiff and weak.  Naps are taken infrequently on days off , lasting from 1-3.5 hourss and are more refreshing than nocturnal sleep.    Review of Systems: Out of a complete 14 system review, the patient complains of only the following symptoms, and all other reviewed systems are negative.:  Fatigue, sleepiness , snoring, fragmented sleep, Insomnia -  Hypersomnia when not physically active or mentally stimulated.    How likely are you to doze in the following situations: 0 = not likely, 1 = slight chance, 2 = moderate chance, 3 = high chance   Sitting and Reading? Watching Television? Sitting inactive in a public place (theater or meeting)? As a passenger in a car for an hour without a break? Lying down in the afternoon when circumstances permit? Sitting and talking to someone? Sitting quietly after lunch without alcohol? In a  car, while stopped for a few minutes in traffic?   Total = 6/ 24 points   FSS endorsed at 35/ 63 points.   Justin Cox  is a 39 y.o. AAM with  with nonischemic dilated cardiomyopathy, coronary angiography on 06/30/2014 which revealed normal coronary arteries with ejection fraction of 35-40%, HIV infection, hyperlipidemia, mixed, hypertension, and resistant hypertension. Has had negative renal artery duplex in July 2018.  He now presents for follow-up of palpitations and hypertension.  He continues to be fatigued and tired, denies chest pain or shortness of breath, still continues to have occasional  skipped beats and palpitations.  Denies dyspnea, chest pain.  No recent weight changes.  States that he has been careful with his diet. ECHO form 08-10-2020:  1. Left ventricle cavity is normal in size and wall thickness. Normal global wall motion. Normal LV systolic function with EF 62%. Normal diastolic filling pattern. 2. Mild pulmonic regurgitation. 3. No significant change compared to previous study on 06/20/2019.  Social History   Socioeconomic History  . Marital status: Single    Spouse name: Not on file  . Number of children: 0  . Years of education: 12th  . Highest education level: Not on file  Occupational History    Employer: [id number]  Tobacco Use  . Smoking status: Former Smoker    Years: 6.00    Types: Cigarettes    Quit date: 10/20/2010    Years since quitting: 9.8  . Smokeless tobacco: Never Used  . Tobacco comment: smoked about 2 packs per week (when used to smoke)  Vaping Use  . Vaping Use: Never used  Substance and Sexual Activity  . Alcohol use: Yes    Alcohol/week: 3.0 standard drinks    Types: 3 Standard drinks or equivalent per week    Comment: Occasional  . Drug use: No  . Sexual activity: Not on file  Other Topics Concern  . Not on file  Social History Narrative   Patient is single with no children.   Patient is right handed.   Patient has hs education.   Patient drinks 1 cup daily.   Social Determinants of Health   Financial Resource Strain: Not on file  Food Insecurity: Not on file  Transportation Needs: Not on file  Physical Activity: Not on file  Stress: Not on file  Social Connections: Not on file    Family History  Problem Relation Age of Onset  . Hypertension Mother   . Diabetes Father   . Hyperlipidemia Father   . Hypertension Father   . Hypertension Other   . Hyperlipidemia Other   . Diabetes Other   . Hypertension Brother     Past Medical History:  Diagnosis Date  . Asthma   . CHF (congestive heart failure) (HCC)   .  Headache   . High cholesterol   . HIV infection (HCC)   . Hypertension   . TIA (transient ischemic attack)   . Umbilical hernia     Past Surgical History:  Procedure Laterality Date  . INSERTION OF MESH N/A 08/30/2018   Procedure: INSERTION OF MESH;  Surgeon: Manus Rudd, MD;  Location: [facility] OR;  Service: General;  Laterality: N/A;  . LEFT HEART CATHETERIZATION WITH CORONARY ANGIOGRAM N/A 06/30/2014   Procedure: LEFT HEART CATHETERIZATION WITH CORONARY ANGIOGRAM;  Surgeon: [name], MD;  Location[facility] CATH LAB;  Service: Cardiovascular;  Laterality: N/A;  . UMBILICAL HERNIA REPAIR N/A 08/30/2018   Procedure: HERNIA REPAIR UMBILICAL ADULT ERAS  PATHWAY;  Surgeon: Manus Rudd, MD;  Location: [facility] OR;  Service: General;  Laterality: N/A;     Current Outpatient Medications on File Prior to Visit  Medication Sig Dispense Refill  . allopurinol (ZYLOPRIM) 100 MG tablet Take 100 mg by mouth daily.    Marland Kitchen amLODipine (NORVASC) 10 MG tablet Take 1 tablet by mouth once daily 90 tablet 0  . Ascorbic Acid (VITAMIN C) 1000 MG tablet Take 1,000 mg by mouth daily.    Marland Kitchen aspirin EC 325 MG EC tablet Take 1 tablet (325 mg total) by mouth daily. 30 tablet 0  . BIKTARVY 50-200-25 MG TABS tablet Take 1 tablet by mouth every evening.  11  . BYSTOLIC 20 MG TABS Take 1 tablet by mouth once daily 30 tablet 2  . chlorthalidone (HYGROTON) 25 MG tablet Take 1 tablet (25 mg total) by mouth every morning. 30 tablet 2  . ENTRESTO 97-103 MG Take 1 tablet by mouth twice daily 60 tablet 2  . rosuvastatin (CRESTOR) 10 MG tablet TAKE 1 TABLET BY MOUTH ONCE DAILY IN THE MORNING 90 tablet 0   No current facility-administered medications on file prior to visit.    Allergies  Allergen Reactions  . Enalapril Cough    Physical exam:  Today's Vitals   09/03/20 1008  BP: (!) 91/55  Pulse: 77  Weight: 210 lb (95.3 kg)  Height: 6' (1.829 m)   Body mass index is 28.48 kg/m.   Wt Readings from Last 3  Encounters:  09/03/20 210 lb (95.3 kg)  08/03/20 214 lb (97.1 kg)  01/31/20 206 lb (93.4 kg)     Ht Readings from Last 3 Encounters:  09/03/20 6' (1.829 m)  08/03/20 6' (1.829 m)  01/31/20 6' (1.829 m)      General: The patient is awake, alert and appears not in acute distress. The patient is well groomed. Head: Normocephalic, atraumatic. Neck is supple. Mallampati 3 plus  neck circumference:17.5  inches . Nasal airflow patent.  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  Dental status:  Intact. Tongue is pierced.  Cardiovascular:  Regular rate and cardiac rhythm by pulse,  without distended neck veins. Respiratory: Lungs are clear to auscultation.  Skin:  Without evidence of ankle edema, or rash. Trunk: The patient's posture is erect.   Neurologic exam : The patient is awake and alert, oriented to place and time.   Memory subjective described as intact.  Attention span & concentration ability appears normal.  Speech is fluent,  without  dysarthria, dysphonia or aphasia.  Mood and affect are appropriate.   Cranial nerves: no loss of smell or taste reported  Pupils are equal and briskly reactive to light. Funduscopic exam deferred.   Extraocular movements in vertical and horizontal planes were intact and without nystagmus. No Diplopia. Visual fields by finger perimetry are intact. Hearing was intact to tuning fork, non lateralizing.   Facial sensation intact to fine touch.  Facial motor strength is symmetric and tongue and uvula move midline.  Neck ROM : rotation, tilt and flexion extension were normal for age and shoulder shrug was symmetrical.    Motor exam:  Symmetric bulk, tone and ROM.   Normal tone without cog wheeling, symmetric grip strength .   Sensory: pain in bottom of both feet- tingling, numbness-  Fine touch  and vibration were tested and  Normal in knee, hands and ankles. .  Proprioception tested in the upper extremities was normal.   Coordination: Rapid alternating  movements in the fingers/hands  were of normal speed.  The Finger-to-nose maneuver was intact without evidence of ataxia, dysmetria or tremor.   Gait and station: Patient could rise unassisted from a seated position, walked without assistive device.  Stance is of normal width/ base and the patient turned with 3 steps.  Toe and heel walk were deferred.  Deep tendon reflexes: in the  upper and lower extremities are symmetric and intact.  Babinski response was deferred .      After spending a total time of 35 minutes face to face and additional time for physical and neurologic examination, review of laboratory studies,  personal review of imaging studies, reports and results of other testing and review of referral information / records as far as provided in visit, I have established the following assessments:  1) insomnia-  Patient waking up with palpitations,fluuters,  believes this is related to vivid dreams.  2) he has been told he snores, too. Has a narrow upper airway, retrognathia and larger neck.  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  4) normal echo.    My Plan is to proceed with:  1) screen for OSA,  Unlikely at risk for Central sleep apnea.   2) insomnia- try melatonin- 3 mg at night- and may try L tryptophane. Allocate enough time to sleep, plan for 7-8 hours in bed.  3) read in bed, no electronic screen light. Use a book, printed magazine- incandescent light bulb.   I would like to thank Dr Jacinto Halim, MD  for allowing me to meet with and to take care of this pleasant patient.   In short, Justin Cox is presenting with shift work sleep disorder, insomnia and new onset snoring, nocturnal palpitations- and RETROGNATHIA..  I plan to follow up either personally or through our NP within 3-4 month.   PS : There have been supply chain delays all around for electronic devices, anything depending on a chip.  CPAP and BiPAP machines are no  exception. Replacements and new CPAP orders alike are delivered very delayed.   CC: I will share my notes with PCP.  Electronically signed by: Melvyn Novas, MD 09/03/2020 10:29 AM  [facility] and [facility] Board certified by The [employer] of Sleep Medicine and Diplomate of the [employer] of Sleep Medicine. Board certified In Neurology through the ABPN, Fellow of the [employer] of Neurology. Medical Director of [facility].

## 2020-09-16 ENCOUNTER — Other Ambulatory Visit: Payer: Self-pay | Admitting: Cardiology

## 2020-09-21 ENCOUNTER — Ambulatory Visit: Payer: BC Managed Care – PPO | Admitting: Cardiology

## 2020-09-23 ENCOUNTER — Ambulatory Visit (INDEPENDENT_AMBULATORY_CARE_PROVIDER_SITE_OTHER): Payer: BC Managed Care – PPO | Admitting: Neurology

## 2020-09-23 DIAGNOSIS — G4733 Obstructive sleep apnea (adult) (pediatric): Secondary | ICD-10-CM | POA: Diagnosis not present

## 2020-09-23 DIAGNOSIS — R0683 Snoring: Secondary | ICD-10-CM

## 2020-09-23 DIAGNOSIS — G4726 Circadian rhythm sleep disorder, shift work type: Secondary | ICD-10-CM

## 2020-09-23 DIAGNOSIS — G4700 Insomnia, unspecified: Secondary | ICD-10-CM

## 2020-09-23 DIAGNOSIS — R002 Palpitations: Secondary | ICD-10-CM

## 2020-09-23 DIAGNOSIS — M2619 Other specified anomalies of jaw-cranial base relationship: Secondary | ICD-10-CM

## 2020-09-30 NOTE — Progress Notes (Signed)
   [facility]   HOME SLEEP TEST (Watch PAT)  STUDY DATE: 01/05- download 09-30-2020  DOB: [birth year]  MRN: [id number]  ORDERING CLINICIAN: Melvyn Novas, MD   REFERRING CLINICIAN: Georgianne Fick, MD   CLINICAL INFORMATION/HISTORY: Justin Cox is a African American male with a medical history of Asthma, CHF (congestive heart failure) (HCC) with non-ischemic dilated cardiomyopathy, Headache, High cholesterol, HIV infection (HCC), Hypertension, TIA (transient ischemic attack), and Umbilical hernia.  Epworth sleepiness score: 6/24. BMI: 28.4 kg/m Neck Circumference: 17.5 "  FINDINGS: Total Record Time (hours, min) :6 h 13 min Total Sleep Time (hours, min):    4 h 59 min   Percent REM (%):    23.77 %  Calculated pAHI (per hour):  37.3       REM pAHI:    61.2     NREM pAHI: 29.5 Supine AHI: 37.3/h   Oxygen Saturation (%) Mean: 93  Minimum oxygen saturation (%):        88  O2 Saturation Range (%): 88-99  O2Saturation (minutes) <=88%: 0.1 min Pulse Mean (bpm):    70  Pulse Range (52-105)   IMPRESSION: This HST confirmed the presence of severe sleep apnea- OSA (obstructive sleep apnea) at AHI of 37.3/h with a strong REM sleep dependent component. No hypoxia was recorded.      RECOMMENDATION: REM dependent sleep apnea in atrial fibrillation or CHF is best treated with Positive airway pressure therapy, and we can start with auto CPAP, 5-17 cm water , 3 cm EPR a mask of patient's choice ( to be fitted in reclined position) , and with heated humidification. This patient has pronounced retrognathia and should avoid a FFM resting on his chin.     INTERPRETING PHYSICIAN:  Melvyn Novas, MD  [facility] and [facility] Sleep Board certified by [employer]  Fellow of the [employer]. Medical Director of [employer].

## 2020-09-30 NOTE — Procedures (Signed)
[facility]   HOME SLEEP TEST (Watch PAT)  STUDY DATE: 01/05- download 09-30-2020  DOB: [birth year]  MRN: [id number]  ORDERING CLINICIAN: Melvyn Novas, MD   REFERRING CLINICIAN: Yates Decamp, MD and  Georgianne Fick, MD   CLINICAL INFORMATION/HISTORY: Justin Cox is a African American male with a medical history of Asthma, CHF (congestive heart failure) (HCC) with non-ischemic dilated cardiomyopathy, Headache, High cholesterol, HIV infection (HCC), Hypertension, TIA (transient ischemic attack), and Umbilical hernia.  Epworth sleepiness score: 6/24. BMI: 28.4 kg/m Neck Circumference: 17.5 "  FINDINGS: Total Record Time (hours, min) :6 h 13 min Total Sleep Time (hours, min):    4 h 59 min   Percent REM (%):    23.77 %  Calculated pAHI (per hour):  37.3       REM pAHI:    61.2     NREM pAHI: 29.5 Supine AHI: 37.3/h   Oxygen Saturation (%) Mean: 93  Minimum oxygen saturation (%):        88  O2 Saturation Range (%): 88-99  O2Saturation (minutes) <=88%: 0.1 min Pulse Mean (bpm):    70  Pulse Range (52-105)   IMPRESSION: This HST confirmed the presence of severe sleep apnea- OSA (obstructive sleep apnea) at AHI of 37.3/h with a strong REM sleep dependent component. No hypoxia was recorded.      RECOMMENDATION: REM dependent sleep apnea in atrial fibrillation or CHF is best treated with Positive airway pressure therapy, and we can start with auto CPAP, 5-17 cm water , 3 cm EPR a mask of patient's choice ( to be fitted in reclined position) , and with heated humidification.     INTERPRETING PHYSICIAN:  Melvyn Novas, MD  [facility] and [facility] Sleep Board certified by [employer] Sleep Medicine  Fellow of the [employer]. Medical Director of [employer].

## 2020-09-30 NOTE — Progress Notes (Signed)
IMPRESSION: This HST confirmed the presence of severe sleep apnea- OSA (obstructive sleep apnea) at AHI of 37.3/h with a strong REM sleep dependent component. No hypoxia was recorded.      RECOMMENDATION: REM dependent sleep apnea in atrial fibrillation or CHF is best treated with Positive airway pressure therapy, and we can start with auto CPAP, 5-17 cm water , 3 cm EPR a mask of patient's choice ( to be fitted in reclined position) , and with heated humidification. This patient has pronounced retrognathia and should avoid a FFM resting on his chin.     INTERPRETING PHYSICIAN:  Melvyn Novas, MD  [facility]

## 2020-09-30 NOTE — Addendum Note (Signed)
Addended by: Melvyn Novas on: 09/30/2020 06:05 PM   Modules accepted: Orders

## 2020-10-01 NOTE — Progress Notes (Signed)
RECOMMENDATION: REM dependent sleep apnea in atrial fibrillation  or CHF is best treated with Positive airway pressure therapy, and  we can start with auto CPAP, 5-17 cm water , 3 cm EPR a mask of  patient's choice ( to be fitted in reclined position) , and with  heated humidification.

## 2020-10-01 NOTE — Addendum Note (Signed)
Addended by: Melvyn Novas on: 10/01/2020 01:12 PM   Modules accepted: Orders

## 2020-10-06 ENCOUNTER — Telehealth: Payer: Self-pay

## 2020-10-06 NOTE — Telephone Encounter (Signed)
I called pt. I advised pt that Dr. Vickey Huger reviewed their sleep study results and found that pt has severe osa. Dr. Vickey Huger recommends that pt start an auto pap at home. I reviewed PAP compliance expectations with the pt. Pt is agreeable to starting an auto-PAP. I advised pt that an order will be sent to a DME, [facility], and [facility] will call the pt within about one week after they file with the pt's insurance. [facility] will show the pt how to use the machine, fit for masks, and troubleshoot the auto-PAP if needed. A follow up appt was made for insurance purposes with Dr. Vickey Huger on 02/03/2021 at 10:30am. Pt verbalized understanding to arrive 15 minutes early and bring their auto-PAP. A letter with all of this information in it will be mailed to the pt as a reminder. I verified with the pt that the address we have on file is correct. Pt verbalized understanding of results. Pt had no questions at this time but was encouraged to call back if questions arise. I have sent the order to [facility] and have received confirmation that they have received the order.

## 2020-10-06 NOTE — Telephone Encounter (Signed)
-----   Message from Melvyn Novas, MD sent at 09/30/2020  6:05 PM EST ----- IMPRESSION: This HST confirmed the presence of severe sleep apnea- OSA (obstructive sleep apnea) at AHI of 37.3/h with a strong REM sleep dependent component. No hypoxia was recorded.      RECOMMENDATION: REM dependent sleep apnea in atrial fibrillation or CHF is best treated with Positive airway pressure therapy, and we can start with auto CPAP, 5-17 cm water , 3 cm EPR a mask of patient's choice ( to be fitted in reclined position) , and with heated humidification. This patient has pronounced retrognathia and should avoid a FFM resting on his chin.     INTERPRETING PHYSICIAN:  Melvyn Novas, MD  [facility]

## 2020-10-07 ENCOUNTER — Telehealth: Payer: Self-pay | Admitting: Neurology

## 2020-10-07 NOTE — Telephone Encounter (Signed)
-----   Message from Melvyn Novas, MD sent at 10/01/2020  1:12 PM EST ----- RECOMMENDATION: REM dependent sleep apnea in atrial fibrillation  or CHF is best treated with Positive airway pressure therapy, and  we can start with auto CPAP, 5-17 cm water , 3 cm EPR a mask of  patient's choice ( to be fitted in reclined position) , and with  heated humidification.

## 2020-11-08 ENCOUNTER — Other Ambulatory Visit: Payer: Self-pay | Admitting: Cardiology

## 2020-11-08 DIAGNOSIS — I428 Other cardiomyopathies: Secondary | ICD-10-CM

## 2020-11-19 ENCOUNTER — Other Ambulatory Visit: Payer: Self-pay | Admitting: Cardiology

## 2020-11-19 DIAGNOSIS — I110 Hypertensive heart disease with heart failure: Secondary | ICD-10-CM

## 2020-12-11 ENCOUNTER — Other Ambulatory Visit: Payer: Self-pay | Admitting: Cardiology

## 2020-12-11 DIAGNOSIS — I428 Other cardiomyopathies: Secondary | ICD-10-CM

## 2020-12-18 DIAGNOSIS — B20 Human immunodeficiency virus [HIV] disease: Principal | ICD-10-CM

## 2020-12-21 MED ORDER — BIKTARVY 50 MG-200 MG-25 MG TABLET
ORAL_TABLET | 5 refills | 0 days | Status: CP
Start: 2020-12-21 — End: ?

## 2020-12-24 ENCOUNTER — Telehealth: Payer: Self-pay | Admitting: Neurology

## 2020-12-24 NOTE — Telephone Encounter (Signed)
Pt LVM need help with CPAP machine pressure. Pressure got to the point where I couldn't keep the mask on. Would like a call from the nurse.

## 2020-12-24 NOTE — Telephone Encounter (Signed)
Called the patient back.  There was no answer.  Left a very detailed message advising the patient unfortunately with the new machines I do not have access to his data until he scans his QR code.  I gave instructions to the patient on how he would go about doing that in hopes that if he did no help to the instructions could guide him to do so.  Advised the patient to let me know once he has done that and then I will attempt to see if it populate on our screen.  Without seeing the data I have no way of knowing what changes need to be made.  Informed the patient that sometimes the pressure can increase because of mask leaks so we would not make sure his mask is fitting correctly.  If it is not he would need set up with the company to get a new mask.  Instructed the patient to call back with questions or send me a MyChart message let me know that he has scanned his QR code.

## 2021-01-05 ENCOUNTER — Other Ambulatory Visit: Payer: Self-pay | Admitting: Cardiology

## 2021-01-05 DIAGNOSIS — I110 Hypertensive heart disease with heart failure: Secondary | ICD-10-CM

## 2021-01-11 ENCOUNTER — Other Ambulatory Visit: Payer: Self-pay | Admitting: Cardiology

## 2021-02-02 ENCOUNTER — Encounter: Payer: Self-pay | Admitting: Neurology

## 2021-02-02 ENCOUNTER — Telehealth: Payer: Self-pay | Admitting: Neurology

## 2021-02-02 NOTE — Telephone Encounter (Signed)
Attempted to call the patient to remind him to bring his machine and power cord to the visit tomorrow.  There was no answer and voicemail box was full. Will send a mychart message as well.   **If patient returns call please advise him to bring his machine and power cord to this visit

## 2021-02-03 ENCOUNTER — Encounter: Payer: Self-pay | Admitting: Neurology

## 2021-02-03 ENCOUNTER — Ambulatory Visit: Payer: BC Managed Care – PPO | Admitting: Neurology

## 2021-02-03 VITALS — BP 145/97 | HR 66 | Ht 72.0 in | Wt 211.0 lb

## 2021-02-03 DIAGNOSIS — M2619 Other specified anomalies of jaw-cranial base relationship: Secondary | ICD-10-CM

## 2021-02-03 DIAGNOSIS — G4733 Obstructive sleep apnea (adult) (pediatric): Secondary | ICD-10-CM | POA: Diagnosis not present

## 2021-02-03 DIAGNOSIS — R002 Palpitations: Secondary | ICD-10-CM

## 2021-02-03 DIAGNOSIS — G4726 Circadian rhythm sleep disorder, shift work type: Secondary | ICD-10-CM | POA: Diagnosis not present

## 2021-02-03 MED ORDER — TRAZODONE HCL 50 MG PO TABS: 25.0000 mg | ORAL_TABLET | Freq: Every evening | ORAL | 1 refills | Status: DC | PRN

## 2021-02-03 NOTE — Patient Instructions (Signed)
Trazodone Tablets What is this medicine? TRAZODONE (TRAZ oh done) is used to treat depression. This medicine may be used for other purposes; ask your health care provider or pharmacist if you have questions. COMMON BRAND NAME(S): Desyrel What should I tell my health care provider before I take this medicine? They need to know if you have any of these conditions:  attempted suicide or thinking about it  bipolar disorder  bleeding problems  glaucoma  heart disease, or previous heart attack  irregular heart beat  kidney or liver disease  low levels of sodium in the blood  an unusual or allergic reaction to trazodone, other medicines, foods, dyes or preservatives  pregnant or trying to get pregnant  breast-feeding How should I use this medicine? Take this medicine by mouth with a glass of water. Follow the directions on the prescription label. Take this medicine shortly after a meal or a light snack. Take your medicine at regular intervals. Do not take your medicine more often than directed. Do not stop taking this medicine suddenly except upon the advice of your doctor. Stopping this medicine too quickly may cause serious side effects or your condition may worsen. A special MedGuide will be given to you by the pharmacist with each prescription and refill. Be sure to read this information carefully each time. Talk to your pediatrician regarding the use of this medicine in children. Special care may be needed. Overdosage: If you think you have taken too much of this medicine contact a poison control center or emergency room at once. NOTE: This medicine is only for you. Do not share this medicine with others. What if I miss a dose? If you miss a dose, take it as soon as you can. If it is almost time for your next dose, take only that dose. Do not take double or extra doses. What may interact with this medicine? Do not take this medicine with any of the following  medications:  certain medicines for fungal infections like fluconazole, itraconazole, ketoconazole, posaconazole, voriconazole  cisapride  dronedarone  linezolid  MAOIs like Carbex, Eldepryl, Marplan, Nardil, and Parnate  mesoridazine  methylene blue (injected into a vein)  pimozide  saquinavir  thioridazine This medicine may also interact with the following medications:  alcohol  antiviral medicines for HIV or AIDS  aspirin and aspirin-like medicines  barbiturates like phenobarbital  certain medicines for blood pressure, heart disease, irregular heart beat  certain medicines for depression, anxiety, or psychotic disturbances  certain medicines for migraine headache like almotriptan, eletriptan, frovatriptan, naratriptan, rizatriptan, sumatriptan, zolmitriptan  certain medicines for seizures like carbamazepine and phenytoin  certain medicines for sleep  certain medicines that treat or prevent blood clots like dalteparin, enoxaparin, warfarin  digoxin  fentanyl  lithium  NSAIDS, medicines for pain and inflammation, like ibuprofen or naproxen  other medicines that prolong the QT interval (cause an abnormal heart rhythm) like dofetilide  rasagiline  supplements like St. John's wort, kava kava, valerian  tramadol  tryptophan This list may not describe all possible interactions. Give your health care provider a list of all the medicines, herbs, non-prescription drugs, or dietary supplements you use. Also tell them if you smoke, drink alcohol, or use illegal drugs. Some items may interact with your medicine. What should I watch for while using this medicine? Tell your doctor if your symptoms do not get better or if they get worse. Visit your doctor or health care professional for regular checks on your progress. Because it may take   several weeks to see the full effects of this medicine, it is important to continue your treatment as prescribed by your  doctor. Patients and their families should watch out for new or worsening thoughts of suicide or depression. Also watch out for sudden changes in feelings such as feeling anxious, agitated, panicky, irritable, hostile, aggressive, impulsive, severely restless, overly excited and hyperactive, or not being able to sleep. If this happens, especially at the beginning of treatment or after a change in dose, call your health care professional. You may get drowsy or dizzy. Do not drive, use machinery, or do anything that needs mental alertness until you know how this medicine affects you. Do not stand or sit up quickly, especially if you are an older patient. This reduces the risk of dizzy or fainting spells. Alcohol may interfere with the effect of this medicine. Avoid alcoholic drinks. This medicine may cause dry eyes and blurred vision. If you wear contact lenses you may feel some discomfort. Lubricating drops may help. See your eye doctor if the problem does not go away or is severe. Your mouth may get dry. Chewing sugarless gum, sucking hard candy and drinking plenty of water may help. Contact your doctor if the problem does not go away or is severe. What side effects may I notice from receiving this medicine? Side effects that you should report to your doctor or health care professional as soon as possible:  allergic reactions like skin rash, itching or hives, swelling of the face, lips, or tongue  elevated mood, decreased need for sleep, racing thoughts, impulsive behavior  confusion  fast, irregular heartbeat  feeling faint or lightheaded, falls  feeling agitated, angry, or irritable  loss of balance or coordination  painful or prolonged erections  restlessness, pacing, inability to keep still  suicidal thoughts or other mood changes  tremors  trouble sleeping  seizures  unusual bleeding or bruising Side effects that usually do not require medical attention (report to your doctor  or health care professional if they continue or are bothersome):  change in sex drive or performance  change in appetite or weight  constipation  headache  muscle aches or pains  nausea This list may not describe all possible side effects. Call your doctor for medical advice about side effects. You may report side effects to FDA at 1-800-FDA-1088. Where should I keep my medicine? Keep out of the reach of children. Store at room temperature between 15 and 30 degrees C (59 to 86 degrees F). Protect from light. Keep container tightly closed. Throw away any unused medicine after the expiration date. NOTE: This sheet is a summary. It may not cover all possible information. If you have questions about this medicine, talk to your doctor, pharmacist, or health care provider.  2021 Elsevier/Gold Standard (2020-07-27 14:46:11)  

## 2021-02-03 NOTE — Progress Notes (Addendum)
SLEEP MEDICINE CLINIC    Provider:  Melvyn Novasarmen  Makye Radle, MD  Primary Care Physician:  [name], [name], MD [address] [place] [id number]     Referring Provider: Dr. Jacinto HalimGanji, MD         Chief Complaint according to patient   Patient presents with:    . New Patient (Initial Visit)           HISTORY OF PRESENT ILLNESS:  Justin Cox is a 40 y.o. year old African American male patient and was originally seen upon referral on 02/03/2021 from Dr. Jacinto HalimGanji, MD for a sleep medicine CONSULTATION.  He follows up today after having undergone a home sleep test which confirmed the presence of severe apnea this sleep apnea reached an AHI of 37.3 without hypoxemia but was strongly dependent on REM sleep.  REM sleep dependent apnea is usually best treated with a CPAP and therefore a CPAP autotitration was prescribed.  I wanted to avoid a full facemask when I sent my order to the durable medical equipment company but he received a auto titration Machine through [employer] but for [name] here in town.  He has used the machine 18 out of 30 days over 4 hours with mix of 60% compliance.  The average AHI or apnea hypo pnea index per hour of sleep is 1 which is severely good resolution.  However the compliance is still rather low.  He reports that he often woke up having the mask next to him, he was given a large full facemask and spite of my request to fit him with a nasal mask.  The patient has retrognathia so a push on the lower jaw by the rim of her full facemask is actually contraindicated.  He is now looking forward to switch the mask possibly to a medium size but I still would very much want him to use a nasal or pillow if possible.  The mean pressure is 5.9 cmH2O the highest daily average pressure is 9 cmH2O the average leak is 15.4 L/min the average AHI is 0.9 without central apneas arising.  So I feel strongly that the settings are correct for what he needs but that we should give him the  option of a normal full facemask.      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 I was told I snore".    Justin Cox is a [place] male with a medical history of Asthma, CHF (congestive heart failure) (HCC) with non-ischemic dilated cardiomyopathy, Headache, High cholesterol, HIV infection (HCC), Hypertension, TIA (transient ischemic attack), and Umbilical hernia.   Sleep relevant medical history: Insomnia, onset at least 10 years ago. Night terrors , until age 89.  Family medical /sleep history: No other family member on CPAP with OSA, some older cousins with insomnia, no sleep walkers.    Social history:  Patient is working as Production designer, theatre/television/filmmanager at [employer]-  and lives in a household alone. The patient currently works/in late day shifts -alternating with early shifts- up to 2 PM a up to 11 PM. Tobacco use; quit 10 years ago . ETOH use ; socially. Caffeine intake in form of Coffee(/) Soda( pepsi 4-6 a day) Tea ( dinner - lipton sweet tea) ,no energy drinks. Regular exercise in form of walking.     Sleep habits are as follows: The patient's dinner time  is variable -between 4-8  PM. The patient goes to bed at 11 PM and continues to struggle to sleep for one hour- sleep for intervals of 1-2  hours, wakes for unknown reasons, dreams may play a role.    The preferred sleep position is side and supine , with the support of 2 pillows. The bedroom is cool, quiet and dark.  Dreams are reportedly frequent/vivid. Some of them are nightmares- palpitations, emotionally upsetting.   4 AM is the usual rise time for early shifts and 9 AM for the late shift-. The patient wakes up spontaneously on late shift days- with an alarm on early shift days.  He reports not feeling refreshed or restored in AM, with symptoms such as dry mouth, but no morning headaches - and residual fatigue. He feels  stiff and weak.  Naps are taken infrequently on days off , lasting from 1-3.5 hourss and are more refreshing than nocturnal sleep.    Review of Systems: Out of a complete 14 system review, the patient complains of only the following symptoms, and all other reviewed systems are negative.:  Fatigue, sleepiness , snoring, fragmented sleep, Insomnia -  Hypersomnia when not physically active or mentally stimulated.    How likely are you to doze in the following situations: 0 = not likely, 1 = slight chance, 2 = moderate chance, 3 = high chance   Sitting and Reading? Watching Television? Sitting inactive in a public place (theater or meeting)? As a passenger in a car for an hour without a break? Lying down in the afternoon when circumstances permit? Sitting and talking to someone? Sitting quietly after lunch without alcohol? In a car, while stopped for a few minutes in traffic?   Total = 6/ 24 points - unchanged on CPAP   FSS endorsed at 35/ 63 points.   Justin Cox  is a 40 y.o. AAM with  with nonischemic dilated cardiomyopathy, coronary angiography on 06/30/2014 which revealed normal coronary arteries with ejection fraction of 35-40%, HIV infection, hyperlipidemia, mixed, hypertension, and resistant hypertension. Has had negative renal artery duplex in July 2018.  He now presents for follow-up of palpitations and hypertension.  He continues to be fatigued and tired, denies chest pain or shortness of breath, still continues to have occasional skipped beats and palpitations.  Denies dyspnea, chest pain.  No recent weight changes.  States that he has been careful with his diet. ECHO form 08-10-2020:  1. Left ventricle cavity is normal in size and wall thickness. Normal global wall motion. Normal LV systolic function with EF 62%. Normal diastolic filling pattern. 2. Mild pulmonic regurgitation. 3. No significant change compared to previous study on 06/20/2019.  Social History    Socioeconomic History  . Marital status: Single    Spouse name: Not on file  . Number of children: 0  . Years of education: 12th  . Highest education level: Not on file  Occupational History    Employer: [employer]  Tobacco Use  . Smoking status: Former Smoker    Years: 6.00    Types: Cigarettes    Quit date: 10/20/2010    Years since quitting: 10.2  . Smokeless tobacco: Never Used  . Tobacco comment: smoked about 2 packs per week (when used to smoke)  Vaping Use  . Vaping Use: Never used  Substance and Sexual Activity  . Alcohol use: Yes    Alcohol/week: 3.0 standard drinks    Types: 3 Standard drinks or equivalent per week  Comment: Occasional  . Drug use: No  . Sexual activity: Not on file  Other Topics Concern  . Not on file  Social History Narrative   Patient is single with no children.   Patient is right handed.   Patient has hs education.   Patient drinks 1 cup daily.   Social Determinants of Health   Financial Resource Strain: Not on file  Food Insecurity: Not on file  Transportation Needs: Not on file  Physical Activity: Not on file  Stress: Not on file  Social Connections: Not on file    Family History  Problem Relation Age of Onset  . Hypertension Mother   . Diabetes Father   . Hyperlipidemia Father   . Hypertension Father   . Hypertension Other   . Hyperlipidemia Other   . Diabetes Other   . Hypertension Brother     Past Medical History:  Diagnosis Date  . Asthma   . CHF (congestive heart failure) (HCC)   . Headache   . High cholesterol   . HIV infection (HCC)   . Hypertension   . TIA (transient ischemic attack)   . Umbilical hernia     Past Surgical History:  Procedure Laterality Date  . INSERTION OF MESH N/A 08/30/2018   Procedure: INSERTION OF MESH;  Surgeon: Manus Rudd, MD;  Location: [facility] OR;  Service: General;  Laterality: N/A;  . LEFT HEART CATHETERIZATION WITH CORONARY ANGIOGRAM N/A 06/30/2014   Procedure: LEFT HEART  CATHETERIZATION WITH CORONARY ANGIOGRAM;  Surgeon: [name], MD;  Location: [facility] CATH LAB;  Service: Cardiovascular;  Laterality: N/A;  . UMBILICAL HERNIA REPAIR N/A 08/30/2018   Procedure: HERNIA REPAIR UMBILICAL ADULT ERAS PATHWAY;  Surgeon: Manus Rudd, MD;  Location: [facility] OR;  Service: General;  Laterality: N/A;     Current Outpatient Medications on File Prior to Visit  Medication Sig Dispense Refill  . allopurinol (ZYLOPRIM) 100 MG tablet Take 100 mg by mouth daily.    Marland Kitchen amLODipine (NORVASC) 10 MG tablet Take 1 tablet by mouth once daily 90 tablet 0  . Ascorbic Acid (VITAMIN C) 1000 MG tablet Take 1,000 mg by mouth daily.    Marland Kitchen aspirin EC 325 MG EC tablet Take 1 tablet (325 mg total) by mouth daily. 30 tablet 0  . BIKTARVY 50-200-25 MG TABS tablet Take 1 tablet by mouth every evening.  11  . chlorthalidone (HYGROTON) 25 MG tablet Take 1 tablet (25 mg total) by mouth every morning. 30 tablet 2  . ENTRESTO 97-103 MG Take 1 tablet by mouth twice daily 180 tablet 1  . Nebivolol HCl 20 MG TABS Take 1 tablet by mouth once daily 30 tablet 0  . rosuvastatin (CRESTOR) 10 MG tablet TAKE 1 TABLET BY MOUTH ONCE DAILY IN THE MORNING 90 tablet 0   No current facility-administered medications on file prior to visit.    Allergies  Allergen Reactions  . Enalapril Cough   HST 09-23-2020:  IMPRESSION: This HST confirmed the presence of severe sleep apnea- OSA (obstructive sleep apnea) at AHI of 37.3/h with a strong REM sleep dependent component. No hypoxia was recorded.      RECOMMENDATION: REM dependent sleep apnea in atrial fibrillation or CHF is best treated with Positive airway pressure therapy, and we can start with auto CPAP, 5-17 cm water , 3 cm EPR a mask of patient's choice ( to be fitted in reclined position) , and with heated humidification. This patient has pronounced retrognathia and should avoid a FFM  resting on his chin.     INTERPRETING PHYSICIAN:  Melvyn Novas, MD   [facility]  Physical exam:  There were no vitals filed for this visit. There is no height or weight on file to calculate BMI.   Wt Readings from Last 3 Encounters:  09/03/20 210 lb (95.3 kg)  08/03/20 214 lb (97.1 kg)  01/31/20 206 lb (93.4 kg)     Ht Readings from Last 3 Encounters:  09/03/20 6' (1.829 m)  08/03/20 6' (1.829 m)  01/31/20 6' (1.829 m)      General: The patient is awake, alert and appears not in acute distress. The patient is well groomed. Head: Normocephalic, atraumatic. Neck is supple. Mallampati 3 plus  neck circumference:17.5  inches . Nasal airflow patent.  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  Dental status:  Intact. Tongue is pierced.  Cardiovascular:  Regular rate and cardiac rhythm by pulse,  without distended neck veins. Respiratory: Lungs are clear to auscultation.  Skin:  Without evidence of ankle edema, or rash. Trunk: The patient's posture is erect.   Neurologic exam : The patient is awake and alert, oriented to place and time.   Memory subjective described as intact.  Attention span & concentration ability appears normal.  Speech is fluent,  without  dysarthria, dysphonia or aphasia.  Mood and affect are appropriate.   Cranial nerves: no loss of smell or taste reported  Pupils are equal and briskly reactive to light. Funduscopic exam deferred.   Extraocular movements in vertical and horizontal planes were intact and without nystagmus. No Diplopia. Visual fields by finger perimetry are intact. Hearing was intact to tuning fork, non lateralizing.   Facial sensation intact to fine touch.  Facial motor strength is symmetric and tongue and uvula move midline.  Neck ROM : rotation, tilt and flexion extension were normal for age and shoulder shrug was symmetrical.    Motor exam:  Symmetric bulk, tone and ROM.   Normal tone without cog wheeling, symmetric grip strength .   Sensory: pain in bottom of both feet-  tingling, numbness-  Fine touch  and vibration were tested and  Normal in knee, hands and ankles. .  Proprioception tested in the upper extremities was normal.   Coordination:deferred .      After spending a total time of 35 minutes face to face and additional time for physical and neurologic examination, review of laboratory studies,  personal review of imaging studies, reports and results of other testing and review of referral information / records as far as provided in visit, I have established the following assessments:  1) insomnia-  Patient waking up with palpitations, he has been tested out of atrial fibrillation and returned to sinus rhythm on rate control medication.    2) he has been told he snores, too, but never loud-  Has a narrow upper airway, retrognathia and larger neck.  I have ordered a non-FFM for him and yet he was provided a FFM (!) .    My Plan is to proceed with:  1) severe OSA , REM dependent , non hypoxic-  continue CPAP at the current settings and change him to a nasal mask , like ESON , medium or large -  I would like to thank Dr Jacinto Halim, MD  for allowing me to meet with and to take care of this pleasant patient.  In short, Justin Cox is presenting with shift work sleep disorder, insomnia and new onset snoring, nocturnal palpitations-  and RETROGNATHIA.. he was diagnosed with severe OSA, likely cause for his atrial fibrillation.  I plan to follow up either personally or through our NP within another 6 month.  CC: I will share my notes with cardiology  Electronically signed by: Melvyn Novas, MD 02/03/2021 11:01 AM  [facility] Board certified by The [employer] of Sleep Medicine and Diplomate of the [employer] of Sleep Medicine. Board certified In Neurology through the ABPN, Fellow of the [employer] of Neurology. Medical Director of [facility].

## 2021-02-19 ENCOUNTER — Other Ambulatory Visit: Payer: Self-pay | Admitting: Cardiology

## 2021-02-19 DIAGNOSIS — I5042 Chronic combined systolic (congestive) and diastolic (congestive) heart failure: Secondary | ICD-10-CM

## 2021-02-19 DIAGNOSIS — I110 Hypertensive heart disease with heart failure: Secondary | ICD-10-CM

## 2021-02-22 ENCOUNTER — Other Ambulatory Visit: Payer: Self-pay

## 2021-03-15 DIAGNOSIS — B20 Human immunodeficiency virus [HIV] disease: Principal | ICD-10-CM

## 2021-03-15 MED ORDER — BIKTARVY 50 MG-200 MG-25 MG TABLET
ORAL_TABLET | Freq: Every day | ORAL | 5 refills | 30.00000 days | Status: CP
Start: 2021-03-15 — End: ?
  Filled 2021-03-18: qty 30, 30d supply, fill #0

## 2021-03-16 DIAGNOSIS — B20 Human immunodeficiency virus [HIV] disease: Principal | ICD-10-CM

## 2021-03-18 NOTE — Unmapped (Signed)
[facility] Specialty Medication Onboarding    Specialty Medication: BIKTARVY  Prior Authorization: Not Required   Financial Assistance: Yes - copay card approved as secondary   Final Copay/Day Supply: $0 / 30    Insurance Restrictions: None     Notes to Pharmacist: n/a    The triage team has completed the benefits investigation and has determined that the patient is able to fill this medication at [facility] Please contact the patient to complete the onboarding or follow up with the prescribing physician as needed.

## 2021-03-18 NOTE — Unmapped (Signed)
[facility]   Patient Onboarding/Medication Counseling    Benjamin Ortiz is a 40 y.o. male with HIV who I am counseling today on continuation of therapy.  I am speaking to the patient.    Was a Nurse, learning disability used for this call? No    Verified patient's date of birth / HIPAA.    Specialty medication(s) to be sent: Infectious Disease: Biktarvy      Non-specialty medications/supplies to be sent: n/a      Medications not needed at this time: n/a         Biktarvy (bictegravir, emtricitabine, and tenofovir alafenamide)    The patient declined counseling on medication administration, missed dose instructions, goals of therapy, side effects and monitoring parameters, warnings and precautions, drug/food interactions and storage, handling precautions, and disposal because they have taken the medication previously. The information in the declined sections below are for informational purposes only and was not discussed with patient.       Medication & Administration     Dosage: Take 1 tablet by mouth daily    Administration: Take without regard to food    Adherence/Missed dose instructions: take missed dose as soon as you remember. If it is close to the time of your next dose, skip the dose and resume with your next scheduled dose.    Goals of Therapy     To suppress viral replication and keep patient's HIV undetectable by lab tests    Side Effects & Monitoring Parameters     Common Side Effects:  ??? Diarrhea  ??? Upset stomach  ??? Headache  ??? Changes in Weight  ??? Changes in mood    The following side effects should be reported to the provider:     ?? If patient experiences: signs of an allergic reaction (rash; hives; itching; red, swollen, blistered, or peeling skin with or without fever; wheezing; tightness in the chest or throat; trouble breathing, swallowing, or talking; unusual hoarseness; or swelling of the mouth, face, lips, tongue, or throat)  ?? signs of kidney problems (unable to pass urine, change in how much urine is passed, blood in the urine, or a big weight gain)  ?? signs of liver problems (dark urine, feeling tired, not hungry, upset stomach or stomach pain, light-colored stools, throwing up, or yellow skin or eyes)  ?? signs of lactic acidosis (fast breathing, fast heartbeat, a heartbeat that does not feel normal, very bad upset stomach or throwing up, feeling very sleepy, shortness of breath, feeling very tired or weak, very bad dizziness, feeling cold, or muscle pain or cramps)  Weight gain: some patients have reported weight gain after starting this medication. The amount of weight can vary.    Monitoring Parameters:  ?? CD4  Count  ?? HIV RNA plasma levels,  ?? Liver function  ?? Total bilirubin  ?? serum creatinine  ?? urine glucose  ?? urine protein (prior to or when initiating therapy and as clinically indicated during therapy);       Drug/Food Interactions     ??? Medication list reviewed in Epic. The patient was instructed to inform the care team before taking any new medications or supplements. No drug interactions identified.   ??? Calcium Salts: May decrease the serum concentration of Biktarvy. If taken with food, Biktarvy can be administered with calcium salts.   ??? Iron Preparations: May decrease the serum concentration of Biktarvy. If taken with food, Biktarvy can be administered with Ferrous sulfate. If taken on an empty  stomach, Biktarvy must be taken 2 hours before ferrous sulfate. Avoid other iron salts.    Contraindications, Warnings, & Precautions     ??? Black Box Warning: Severe acute exacertbations of HBV have been reported in patients coinfected with HIV-1 and HBV fllowing discontinuation of therapy  ??? Coadministration with dofetilide, rifampin is contraindicated  ??? Immune reconstitution syndrome: Patients may develop immune reconstitution syndrome, resulting in the occurrence of an inflammatory response to an indolent or residual opportunistic infection or activation of autoimmune disorders (eg, Graves disease, polymyositis, Guillain-Barr?? syndrome, autoimmune hepatitis)   ??? Lactic acidosis/hepatomegaly  ??? Renal toxicity: patients with preexisting renal impairment and those taking nephrotoxic agents (including NSAIDs) are at increased risk.     Storage, Handling Precautions, & Disposal     ??? Store in the original container at room temperature.   ??? Keep lid tightly closed.   ??? Store in a dry place. Do not store in a bathroom.   ??? Keep all drugs in a safe place. Keep all drugs out of the reach of children and pets.   ??? Throw away unused or expired drugs. Do not flush down a toilet or pour down a drain unless you are told to do so. Check with your pharmacist if you have questions about the best way to throw out drugs. There may be drug take-back programs in your area.        Current Medications (including OTC/herbals), Comorbidities and Allergies     Current Outpatient Medications   Medication Sig Dispense Refill   ??? allopurinol (ZYLOPRIM) 100 MG tablet Take 100 mg by mouth.     ??? amLODIPine (NORVASC) 10 MG tablet Take 10 mg by mouth daily.     ??? ascorbic acid, vitamin C, (VITAMIN C) 1000 MG tablet Take 1,000 mg by mouth.     ??? aspirin 325 MG delayed release (EC) tablet Take 325 mg by mouth.     ??? aspirin-acetaminophen-caffeine (EXCEDRIN MIGRAINE) 250-250-65 mg per tablet Take 1 tablet by mouth.     ??? bictegrav-emtricit-tenofov ala (BIKTARVY) 50-200-25 mg tablet Take 1 tablet by mouth in the morning. 30 tablet 5   ??? blood pressure test kit-large Kit 1 mL by Miscellaneous route once daily. 1 each 0   ??? BYSTOLIC 20 mg Tab      ??? ENGERIX-B, PF, 20 mcg/mL Syrg   0   ??? ibuprofen (ADVIL,MOTRIN) 200 MG tablet Take 400 mg by mouth.     ??? rosuvastatin (CRESTOR) 10 MG tablet Take 10 mg by mouth daily.     ??? sacubitriL-valsartan (ENTRESTO) 97-103 mg tablet Take 1 tablet by mouth two (2) times a day.       No current facility-administered medications for this visit.       Allergies   Allergen Reactions   ??? Enalapril Cough Patient Active Problem List   Diagnosis   ??? Human immunodeficiency virus (HIV) disease (CMS-HCC)   ??? HTN (hypertension)   ??? Pain, dental   ??? Echocardiogram abnormal   ??? Abnormal electrocardiogram   ??? Chest pain   ??? Dyspnea on exertion   ??? Hyperlipidemia   ??? Malignant hypertension   ??? Hypertensive urgency   ??? Cardiomyopathy (CMS-HCC)   ??? Temporary cerebral vascular dysfunction   ??? AIN (anal intraepithelial neoplasia) anal canal       Reviewed and up to date in Epic.    Appropriateness of Therapy     Acute infections noted within Epic:  No active infections  Patient  reported infection: None    Is medication and dose appropriate based on diagnosis and infection status? Yes    Prescription has been clinically reviewed: Yes      Baseline Quality of Life Assessment      How many days over the past month did your HIV  keep you from your normal activities? For example, brushing your teeth or getting up in the morning. 0    Financial Information     Medication Assistance provided: Copay Assistance    Anticipated copay of $0.00 reviewed with patient. Verified delivery address.    Delivery Information     Scheduled delivery date: 03/19/21    Expected start date: continuation of existing treatment    Medication will be delivered via UPS to the prescription address in [facility].  This shipment will not require a signature.      Explained the services we provide at [facility] and that each month we would call to set up refills.  Stressed importance of returning phone calls so that we could ensure they receive their medications in time each month.  Informed patient that we should be setting up refills 7-10 days prior to when they will run out of medication.  A pharmacist will reach out to perform a clinical assessment periodically.  Informed patient that a welcome packet, containing information about our pharmacy and other support services, a Notice of Privacy Practices, and a drug information handout will be sent.      The patient or caregiver noted above participated in the development of this care plan and knows that they can request review of or adjustments to the care plan at any time.      Patient or caregiver verbalized understanding of the above information as well as how to contact the pharmacy at [phone] option 4 with any questions/concerns.  The pharmacy is open Monday through Friday 8:30am-4:30pm.  A pharmacist is available 24/7 via pager to answer any clinical questions they may have.    Patient Specific Needs     - Does the patient have any physical, cognitive, or cultural barriers? No    - Does the patient have adequate living arrangements? (i.e. the ability to store and take their medication appropriately) Yes    - Did you identify any home environmental safety or security hazards? No    - Patient prefers to have medications discussed with  Patient     - Is the patient or caregiver able to read and understand education materials at a high school level or above? Yes    - Patient's primary language is  English     - Is the patient high risk? No    - Does the patient require physician intervention or other additional services (i.e. dietary/nutrition, smoking cessation, social work)? No      Benjamin Ortiz  [facility] Specialty Pharmacist

## 2021-04-08 ENCOUNTER — Other Ambulatory Visit: Payer: Self-pay | Admitting: Cardiology

## 2021-04-08 DIAGNOSIS — I110 Hypertensive heart disease with heart failure: Secondary | ICD-10-CM

## 2021-04-19 NOTE — Unmapped (Signed)
The [facility] has made a third and final attempt to reach this patient to refill the following medication: Biktarvy.      We have left voicemails on the following phone numbers: [phone]  and have sent a MyChart message.    Dates contacted: 7/22, 7/26, 8/1  Last scheduled delivery: shipped 6/30    The patient may be at risk of non-compliance with this medication. The patient should call the [facility] at [phone] (option 4) to refill medication.    Benjamin Ortiz   [facility] Specialty Technician

## 2021-04-27 NOTE — Unmapped (Signed)
[facility] Refill Coordination Note    Specialty Medication(s) to be Shipped:   Infectious Disease: Biktarvy    Other medication(s) to be shipped: No additional medications requested for fill at this time     Benjamin Ortiz, DOB: 1980-11-15  Phone: [phone] (home)       All above HIPAA information was verified with patient.     Was a Nurse, learning disability used for this call? No    Completed refill call assessment today to schedule patient's medication shipment from the [facility] [phone]).  All relevant notes have been reviewed.     Specialty medication(s) and dose(s) confirmed: Regimen is correct and unchanged.   Changes to medications: Benjamin Ortiz reports no changes at this time.  Changes to insurance: No  New side effects reported not previously addressed with a pharmacist or physician: None reported  Questions for the pharmacist: No    Confirmed patient received a Conservation officer, historic buildings and a Surveyor, mining with first shipment. The patient will receive a drug information handout for each medication shipped and additional FDA Medication Guides as required.       DISEASE/MEDICATION-SPECIFIC INFORMATION        N/A    SPECIALTY MEDICATION ADHERENCE     Medication Adherence    Patient reported X missed doses in the last month: 0  Specialty Medication: biktarvy              Were doses missed due to medication being on hold? No    biktarvy  : 2 days of medicine on hand       REFERRAL TO PHARMACIST     Referral to the pharmacist: Not needed      [facility]     Shipping address confirmed in Epic.     Delivery Scheduled: Yes, Expected medication delivery date: 8/11.     Medication will be delivered via UPS to the prescription address in Epic WAM.    Westley Gambles   [facility] Specialty Technician

## 2021-04-28 DIAGNOSIS — B20 Human immunodeficiency virus [HIV] disease: Principal | ICD-10-CM

## 2021-04-28 NOTE — Unmapped (Signed)
Benjamin Ortiz 's BIKTARVY shipment will be delayed as a result of a high copay.     I have reached out to the patient  at [phone] and left a voicemail message.  We will call the patient back to reschedule the delivery upon resolution. We have not confirmed the new delivery date.

## 2021-04-30 NOTE — Unmapped (Signed)
Benjamin Ortiz 's Biktarvy shipment will be canceled  as a result of a high copay.     I have reached out to the patient  at [phone] and left a voicemail message.  We will not reschedule the medication and have removed this/these medication(s) from the work request.  We have canceled this work request.       I have left vms and sent messages to the pt to update him about his copay.

## 2021-05-13 NOTE — Unmapped (Signed)
Received a message from [facility] regarding patients medication. Patient exhausted copay assistance card and was unable to get meds due to high copay. Informed [name], patient was denied HMAP in June due to income. Also informed Dallas Breeding reached out to [place] to follow up and re-open patients application that was previously done in Feb. Peter Congo was informed by a Chief Operating Officer that they would reach out to the patient and their insurance. Informed [name], since patient was over income for HMAP he would have to do manuf assistance and due to the benefits team capacity, [facility] would have to assist.    Rhetta Mura  ID Clinic Benefits Counselor  Time of Intervention-71mins

## 2021-05-13 NOTE — Unmapped (Signed)
Received call from patient  Yesterday asking if Dr. Loma Boston could change his HIV medication because his copay on the biktarvy is too high for him to afford.    I messaged Rhetta Mura who said that because his income makes him not eligible for HMAP we would need to have [facility] help with medication assistance.    I reached out to pharmacist Roderic Palau for help trying to figure out what the issue is.  He sent this in a message.    [facility] Special[facility] Med Referral: Financial Assistance Unavailable      Medication (Brand/Generic): [employer]     PA Status: No PA Required  Patient ABLE to fill at [facility]  Insurance Company: [employer]  Anticipated Co Pay [per the [facility] pharmacy adjudication claim]: $3179.58     Co-Pay Card: Funds almost depleted - On file $181.61 remaining  [employer]: PAF grant obtained 12/18/2020 outside of MAP assistance - $ 186.60 remaining  (Bin: [id number], PCN: [id number], Group: [id number], ID: [id number])  MFR Assistance: Does not assist commercially insured patients unless all insurance preferred medication has been tried.    Tammy Sours called the insuarance to find out what the preferred medications are and he said    , Insurance rep just told me [name] and Triumeq  The copay for them would be somewhere between 100 and 150/month    The benefits person with the pharmacy also sent this message: Good Morning, The manufacture doesn't typically help commercially insured patients. when at least 2 of the preferred has been tried, all other funds have been exhausted, & the patient still has a co pay over $100 they then allow a grievance appeal to be submitted.    Patient was last seen by Dr. Loma Boston 06/03/2020 and did not have follow up appointment scheduled.  He has been out of meds for about 3 days.  I am unable to see that we have ever done and HLA test to check for being able to use abacavir so would need to have that done before any switch could be made to that, plus he would need to see a provider in order to make a med switch.  He has agreed to come in to see Dr. Juel Burrow next Wednesday at 4pm.  He gets off of work at [employer] so states he can come for in person visit but might be a few minutes after 4 getting to clinic.  I will forward message to Dr. Elta Guadeloupe so she is aware.  It is very difficult for patient to get off work and seemed [place] he could get here before next Wednesday.  In addition Dr. Juel Burrow has been informed by Dr. Loma Boston about her patients as she is mostly taking over her panel.

## 2021-05-18 NOTE — Unmapped (Signed)
Infectious Diseases Clinic        Assessment/Plan:     HIV   -  Overall had been doing well. Current regimen: Biktarvy (BIC/FTC/TAF). Unfortunately, his insurance notified him that this was no longer a preferred medication and his copay has been in excess of $1K. He has thus been out of drug for ~1 week. Per discussion with pharmacy the preferred options include Triumeq or [name]. Given cardiac history and lack of available [id number] on file, will avoid triumeq for now. Will get baseline labs (though some viremia not unepxpected as he has been off meds for 7-10 days), and start [name] today. Had been on a Atripla in 2006 after initial diagnosis reports severe dental issues related to that medication resulting in his having to get 4 teeth pulled.  He has been well suppressed since his diagnosis.    Unfortunately, pt noted to be underinsured but makes too much for UMAP. Attempts for [employer] Advancing Access pursued but unsuccessful in June 2022. Attempted to switch to [facility] but high copay was barrier. Meds last shipped 03/18/21  Misses doses of ARVs occasionally (b/c of above issues, previously had not missed any medications)    Med access through insurance  CD4 count over 300 for >2Y on suppressive ART; no prophylaxis needed; recheck CD4 annually  Discussed ARV adherence, injectable ARVs and switching to [name]    Lab Results   Component Value Date    ACD4 1,269 05/19/2021    CD4TABS 918 12/18/2019    CD4 47 05/19/2021    CD4TCELL 43.7 12/18/2019    HIVRS Not Detected 06/02/2020    HIVCP <40 12/18/2019       ??? HIV RNA and safety labs (brief return panel)  ??? Switching to [name] (3TC/DTG) because of concern about cost  ??? Encouraged continued excellent ARV adherence    HTN  Managed by primary care provider at [facility]  Has required lots of changes but BP is currently very well controlled  Per patient he is on  Entresto  Amlodipine  Vistolic  chlorthalidone  ??  NICM  nonischemic dilated cardiomyopathy, coronary angiography on 06/30/2014 which revealed normal coronary arteries with ejection fraction of 35-40%, though recent echo 07/2020 with EF 62%, mild pulm regurg. EF now stable over last few years.     Severe OSA  Followed by neurology at [facility]    Hyperlipidemia  [facility]  Continue Rosuvatatin 10mg     Monkeypox  ??? Discussed current outbreak, risk factors, lesion recognition, and clinical signs/symptoms including fever, headache, myalgias, and fatigue followed a few days later by a rash. Discussed varied appearance of rash including stages of evolution.   ??? Shared relevant information and vaccine access. Discussed that monkeypox vaccines are free and are based on availability of vaccine at local health depts and at our [facility] ID clinic.        Nicotine dependence  He reports that he quit smoking about 9 years ago. He has never used smokeless tobacco.      Sexual health & secondary prevention  Sex with cis men.    Denies recent sex but does express concern for MPX  He does routinely discuss status with partner(s). Sometimes uses condoms.      Lab Results   Component Value Date    RPR Reactive (A) 05/19/2021    RPR 1:4 05/19/2021    CTNAA Negative 05/19/2021    CTNAA Negative 05/19/2021    CTNAA Negative  05/19/2021    GCNAA Negative 05/19/2021    GCNAA Negative 05/19/2021    GCNAA Negative 05/19/2021    SPECSOURCE Rectum 05/19/2021    SPECSOURCE Throat 05/19/2021    SPECSOURCE Urine 05/19/2021       ??? GC/CT NAATs -- obtained today from all exposed anatomical site(s)  ??? RPR -- for screening obtained today    Health maintenance     Oral health  He does  have a dentist. Last dental exam unknown.    Eye health  He does not use corrective lenses. Last eye exam unknown.    Metabolic conditions  Wt Readings from Last 5 Encounters:   05/19/21 95.8 kg (211 lb 3.2 oz)   06/02/20 95.5 kg (210 lb 9.6 oz)   07/25/19 95.7 kg (210 lb 14.4 oz)   06/11/19 95.7 kg (210 lb 14.4 oz)   02/06/18 96.3 kg (212 lb 6.4 oz)     Lab Results   Component Value Date    CREATININE 0.84 05/19/2021    PROTEINUA NEGATIVE 01/22/2013    PROTEINUR 6 01/22/2013    GLUCOSEU NEGATIVE 01/22/2013    PCRATIOUR 0.075 01/22/2013    GLU 90 05/19/2021    A1C 5.6 05/19/2021    ALT 29 05/19/2021    ALT 36 06/02/2020    ALT 38 12/18/2019     # Kidney health - creatinine today  # Bone health - assessment not yet needed (under age 70)  # Diabetes assessment - random glucose today  # NAFLD assessment - suspicion for NAFLD low    Communicable diseases  Lab Results   Component Value Date    HEPBSAB Reactive (A) 05/19/2021    HEPCAB Nonreactive 05/19/2021     # TB screening - assessment needed but deferred to future visit  # Hepatitis screening - assessment(s) needed but deferred to future visit  # MMR screening - not assessed    Cancer screening  Lab Results   Component Value Date    FINALDX  06/11/2019     A: Anus, brushing/scraping  Atypical squamous cells, cannot exclude high grade squamous intraepithelial lesion     This electronic signature is attestation that the pathologist personally reviewed the submitted material(s) and the final diagnosis reflects that evaluation.       # Cervical - n/a  # Breast - no indication for screening at present    # Anorectal - assessment needed but deferred to future visit  # Colorectal - screening not indicated  # Liver - no screening indicated  # Lung - screening not indicated  # Prostate - screening not indicated    Cardiovascular disease  Lab Results   Component Value Date    CHOL 153 05/19/2021    HDL <20 (L) 05/19/2021    LDL  05/19/2021      Comment:      NHLBI Recommended Ranges, LDL Cholesterol, for Adults (20+yrs) (ATPIII), mg/dL  Optimal              <811  Near Optimal        100-129  Borderline High     130-159  High                160-189  Very High            >=190  NHLBI Recommended Ranges, LDL Cholesterol, for Children (2-19 yrs), mg/dL  Desirable            <914  Borderline High  110-129  High >=130    Unable to calculate due to triglyceride greater than 400 mg/dL.  Unable to calculate due to triglyceride greater than 400 mg/dL.  Unable to calculate due to triglyceride greater than 400 mg/dL.    NONHDL  05/19/2021      Comment:      Non-HDL Cholesterol Recommended Ranges (mg/dL)  Optimal      <960  Near Optimal130 - 159  Borderline High160 - 189  High            190 - 219  Very High      >220      TRIG 542 (H) 05/19/2021     # The ASCVD Risk score Denman George DC Montez Hageman, et al., 2013) failed to calculate.  - is not taking aspirin   - is taking statin  - BP control fair  - former smoker  # AAA screening - no indication for screening    Immunization History   Administered Date(s) Administered   ??? COVID-19 VACC,MRNA,(PFIZER)(PF)(IM) 12/07/2019, 12/28/2019, 08/08/2020   ??? Hepatitis A 04/24/2007, 04/08/2008   ??? Influenza Vaccine Quad (IIV4 PF) 75mo+ injectable 06/02/2020   ??? Influenza Virus Vaccine, unspecified formulation 06/29/2015, 05/27/2019   ??? PNEUMOCOCCAL POLYSACCHARIDE 23 04/12/2005, 02/22/2011   ??? PPD Test 04/08/2008, 06/30/2009, 10/06/2009   ??? Pneumococcal Conjugate 13-Valent 02/04/2014   ??? SMALLPOX,MONKEYPOX(PF)(JYNNEOS) 05/19/2021   ??? TdaP 03/14/2006   ??? Tetanus-Diptheria Toxoids-TD(TDVAX),Asdorbed,2LF(IM) 12/22/2015       ??? Immunizations today - MPX      Counseling services took more than 50% of today's visit time.    I personally spent 46 minutes face-to-face and non-face-to-face in the care of this patient, which includes all pre, intra, and post visit time on the date of service.    Disposition    Return to clinic 3-4 months or sooner if needed.    To do @ next RTC  ??? Recheck VL (can do at [facility] in about 6 weeks so he doesn't have to come in for a full visit, then see me in person in 4-5 mo)  ??? Assess dovato tolerance   ??? Discuss LAI                Chief Complaint   Follow-up HIV      HPI  In addition to details in A&P above:    No concerns other than med concern as above  Denies recent sexual encounters but does request STI testing and MPX vaccination.     HIV:  No missed doses prior to running out of medications  No hospitalizations      Past Medical History:   Diagnosis Date   ??? HIV disease (CMS-HCC)    ??? HTN (hypertension) 01/22/2013   ??? Pain, dental 07/23/2013         Social History  Housing - in apt in [place] with his brother   Just moved in with brother, who moved up from [place].   School / Work - Not in school. Employed Public relations account executive)    Tobacco - He reports that he quit smoking about 9 years ago. He has never used smokeless tobacco.   Alcohol - special occassions    Substance use - none      Review of Systems  As per HPI. All others negative.      Medications and Allergies  He has a current medication list which includes the following prescription(s): allopurinol, amlodipine, ascorbic acid (vitamin c), aspirin, aspirin-acetaminophen-caffeine, blood pressure test kit-large,  bystolic, ibuprofen, rosuvastatin, entresto, trazodone, dovato, engerix-b (pf), and metoprolol succinate.    Allergies: Enalapril      Family History  His family history includes Diabetes in his father; Heart disease in his father; Rheumatologic disease in his mother.                   BP 138/84  - Pulse 78  - Temp 37.3 ??C (99.2 ??F) (Oral)  - Ht 182.9 cm (6')  - Wt 95.8 kg (211 lb 3.2 oz)  - BMI 28.64 kg/m??      Const looks well and attentive, alert, appropriate   Eyes sclerae anicteric, noninjected OU   ENT dentition good, no thrush, leukoplakia or oral lesions and tongue piercing   Lymph no cervical LAD   CV RRR. No murmurs. No rub or gallop. S1/S2.   Resp CTAB ant/post, normal work of breathing   GI nondistended   GU deferred   Rectal deferred   Skin no petechiae, ecchymoses or obvious rashes on clothed exam   MSK no joint tenderness and normal ROM throughout   Neuro CN II-XII grossly intact, MAEE, non focal   Psych Appropriate affect. Eye contact good. Linear thoughts. Fluent speech.

## 2021-05-19 ENCOUNTER — Ambulatory Visit
Admit: 2021-05-19 | Discharge: 2021-05-20 | Payer: PRIVATE HEALTH INSURANCE | Attending: Student in an Organized Health Care Education/Training Program | Primary: Student in an Organized Health Care Education/Training Program

## 2021-05-19 DIAGNOSIS — B20 Human immunodeficiency virus [HIV] disease: Principal | ICD-10-CM

## 2021-05-19 LAB — BASIC METABOLIC PANEL
ANION GAP: 6 mmol/L (ref 5–14)
BLOOD UREA NITROGEN: 8 mg/dL — ABNORMAL LOW (ref 9–23)
BUN / CREAT RATIO: 10
CALCIUM: 9.6 mg/dL (ref 8.7–10.4)
CHLORIDE: 109 mmol/L — ABNORMAL HIGH (ref 98–107)
CO2: 25.1 mmol/L (ref 20.0–31.0)
CREATININE: 0.84 mg/dL
EGFR CKD-EPI (2021) MALE: >90 mL/min/{1.73_m2} (ref >=60)
GLUCOSE RANDOM: 90 mg/dL (ref 70–179)
POTASSIUM: 3.6 mmol/L (ref 3.4–4.8)
SODIUM: 140 mmol/L (ref 135–145)

## 2021-05-19 LAB — LIPID PANEL
CHOLESTEROL: 153 mg/dL (ref <=200)
HDL CHOLESTEROL: <20 mg/dL — ABNORMAL LOW (ref 40–60)
TRIGLYCERIDES: 542 mg/dL — ABNORMAL HIGH (ref 0–150)

## 2021-05-19 LAB — CBC W/ AUTO DIFF
BASOPHILS ABSOLUTE COUNT: 0.1 10*9/L (ref 0.0–0.1)
BASOPHILS RELATIVE PERCENT: 1.3 %
EOSINOPHILS ABSOLUTE COUNT: 0.1 10*9/L (ref 0.0–0.5)
EOSINOPHILS RELATIVE PERCENT: 1.5 %
HEMATOCRIT: 44 % (ref 39.0–48.0)
HEMOGLOBIN: 15.2 g/dL (ref 12.9–16.5)
LYMPHOCYTES ABSOLUTE COUNT: 2.7 10*9/L (ref 1.1–3.6)
LYMPHOCYTES RELATIVE PERCENT: 48.8 %
MEAN CORPUSCULAR HEMOGLOBIN CONC: 34.6 g/dL (ref 32.0–36.0)
MEAN CORPUSCULAR HEMOGLOBIN: 34.5 pg — ABNORMAL HIGH (ref 25.9–32.4)
MEAN CORPUSCULAR VOLUME: 99.8 fL — ABNORMAL HIGH (ref 77.6–95.7)
MEAN PLATELET VOLUME: 10 fL (ref 6.8–10.7)
MONOCYTES ABSOLUTE COUNT: 0.6 10*9/L (ref 0.3–0.8)
MONOCYTES RELATIVE PERCENT: 10.5 %
NEUTROPHILS ABSOLUTE COUNT: 2.1 10*9/L (ref 1.8–7.8)
NEUTROPHILS RELATIVE PERCENT: 37.9 %
NUCLEATED RED BLOOD CELLS: 0 /100{WBCs} (ref <=4)
PLATELET COUNT: 137 10*9/L — ABNORMAL LOW (ref 150–450)
RED BLOOD CELL COUNT: 4.41 10*12/L (ref 4.26–5.60)
RED CELL DISTRIBUTION WIDTH: 13.3 % (ref 12.2–15.2)
WBC ADJUSTED: 5.4 10*9/L (ref 3.6–11.2)

## 2021-05-19 LAB — ALT: ALT (SGPT): 29 U/L (ref 10–49)

## 2021-05-19 LAB — AST: AST (SGOT): 19 U/L (ref <=34)

## 2021-05-19 LAB — HEMOGLOBIN A1C
ESTIMATED AVERAGE GLUCOSE: 114 mg/dL
HEMOGLOBIN A1C: 5.6 % (ref 4.8–5.6)

## 2021-05-19 LAB — BILIRUBIN, TOTAL: BILIRUBIN TOTAL: 0.3 mg/dL (ref 0.3–1.2)

## 2021-05-19 MED ORDER — DOVATO 50 MG-300 MG TABLET
ORAL_TABLET | Freq: Every day | ORAL | 11 refills | 0 days | Status: CP
Start: 2021-05-19 — End: ?
  Filled 2021-05-25: qty 30, 30d supply, fill #0

## 2021-05-19 NOTE — Unmapped (Signed)
Hasn't taken Biktarvy this week, insurance is not covering it

## 2021-05-19 NOTE — Unmapped (Addendum)
It was great to see you today.    We have sent a new prescription for your medications to the [facility] - they should be processing this and hopefully helping identify a copay assistance process for the new medication.     The name of your new anti-HIV medication is [name] - a combination of two drugs which were similar to the drugs that you had been taking in your prior medication, biktarvy.     Some people will have some nausea or diarrhea after switching to a new medication but this is typically only for 1-2 weeks and usually mild.    You should reach out to our clinic if you are having severe symptoms (intractable vomiting, diarrhea, etc).     We have gotten some baseline I would like to see you in a few months to recheck your bodies response to this new medication .    The ID clinic phone number is [phone].  The ID clinic fax number is [phone].    Please note that your laboratory and other results may be visible to you in real time, possibly before they reach your provider. Please allow 48 hours for clinical interpretation of these results. Importantly, even if a result is flagged as abnormal, it may not be one that impacts your health.    For urgent issues on nights and weekends you may reach the ID Physician on call through the [facility] Operator at [phone].     URGENT CARE  Please call ahead to speak with the nursing staff if you are in need of an urgent appointment.       MEDICATIONS  For refills please contact your pharmacy and ask them to electronically send or fax the request to the clinic.   Please bring all medications in original bottles to every appointment.    HMAP (formerly ADAP) or [employer] Eligibility (required even if you do not receive medication through [facility])  Please remember to renew your Juanell Fairly eligibility during renewal periods which occur twice a year: January-March and July-September.     The following are needed for each renewal:   - [facility] Identification (if you don't have one, then a bill with your name and address in [place])   - proof of income (award letter, W-2, or last three check stubs)   If you are unable to come in for renewal, let us know if we can mail, fax or e-mail paperwork to you.   HMAP Contact: [phone].     Terrall Laity, MD  [facility] Infectious Diseases Clinic at [facility]  [address]   [place], [place] [address]  Phone: [phone]   Fax: [phone]     Lab info:  Your most recent CD4 T-cell counts and viral loads are below. Here are a few things to keep in mind when looking at your numbers:  Our goal is to get your virus to be undetectable and keep it undetectable. If the virus is undetectable you are much more likely to stay healthy.  We consider your viral load to be undetectable if it says <40 or if it says Not detected.  For most people, we're checking CD4 counts every other visit (once or twice a year, or sometimes even less).  It's normal for your CD4 count to be different from visit to visit.   You can help by taking your medications at about the same time, every single day. If you're having trouble with taking  your medications, it's important to let us know.    Lab Results   Component Value Date    ACD4 980 06/02/2020    CD4 49 06/02/2020    HIVCP <40 12/18/2019    HIVRS Not Detected 06/02/2020

## 2021-05-20 DIAGNOSIS — B20 Human immunodeficiency virus [HIV] disease: Principal | ICD-10-CM

## 2021-05-20 LAB — LYMPH MARKER LIMITED,FLOW
ABSOLUTE CD3 CNT: 2052 {cells}/uL (ref 915–3400)
ABSOLUTE CD4 CNT: 1269 {cells}/uL (ref 510–2320)
ABSOLUTE CD8 CNT: 810 {cells}/uL (ref 180–1520)
CD3% (T CELLS): 76 % (ref 61–86)
CD4% (T HELPER): 47 % (ref 34–58)
CD4:CD8 RATIO: 1.6 (ref 0.9–4.8)
CD8% T SUPPRESR: 30 % (ref 12–38)

## 2021-05-20 LAB — SYPHILIS SCREEN: RPR TITER REFLEX: 1:4 {titer} — AB

## 2021-05-21 DIAGNOSIS — B20 Human immunodeficiency virus [HIV] disease: Principal | ICD-10-CM

## 2021-05-21 LAB — HEPATITIS B SURFACE ANTIBODY
HEPATITIS B SURFACE ANTIBODY QUANT: 121.63 m[IU]/mL — ABNORMAL HIGH (ref <8.00)
HEPATITIS B SURFACE ANTIBODY: REACTIVE — AB

## 2021-05-21 LAB — HEPATITIS B CORE ANTIBODY, TOTAL: HEPATITIS B CORE TOTAL ANTIBODY: NONREACTIVE

## 2021-05-21 LAB — HEPATITIS C ANTIBODY: HEPATITIS C ANTIBODY: NONREACTIVE

## 2021-05-21 LAB — HEPATITIS B SURFACE ANTIGEN: HEPATITIS B SURFACE ANTIGEN: NONREACTIVE

## 2021-05-21 NOTE — Unmapped (Signed)
Orders only

## 2021-05-22 LAB — HIV RNA, QUANTITATIVE, PCR
HIV RNA QNT RSLT: DETECTED — AB
HIV RNA: <40 {copies}/mL — ABNORMAL HIGH (ref <0)

## 2021-05-24 NOTE — Unmapped (Signed)
[facility] Specialty Medication Onboarding    Specialty Medication: [name] 50-300 mg Tab (dolutegravir-lamivudine)  Prior Authorization: Not Required   Financial Assistance: Yes - copay card approved as secondary   Final Copay/Day Supply: $0 / 30    Insurance Restrictions: None     Notes to Pharmacist: n/a    The triage team has completed the benefits investigation and has determined that the patient is able to fill this medication at [facility]. Please contact the patient to complete the onboarding or follow up with the prescribing physician as needed.

## 2021-05-25 ENCOUNTER — Other Ambulatory Visit: Payer: Self-pay | Admitting: Cardiology

## 2021-05-25 DIAGNOSIS — I5042 Chronic combined systolic (congestive) and diastolic (congestive) heart failure: Secondary | ICD-10-CM

## 2021-05-25 DIAGNOSIS — I110 Hypertensive heart disease with heart failure: Secondary | ICD-10-CM

## 2021-05-25 NOTE — Unmapped (Signed)
[facility]   Patient Onboarding/Medication Counseling    Benjamin Ortiz is a 40 y.o. male with HIV who I am counseling today on initiation of therapy.  I am speaking to the patient.    Was a Nurse, learning disability used for this call? No    Verified patient's date of birth / HIPAA.    Specialty medication(s) to be sent: Infectious Disease: [name]      Non-specialty medications/supplies to be sent: n/a      Medications not needed at this time: n/a         [name] (dolutegravir-lamivudine 50-300mg ) tablets    Medication & Administration     Dosage: Take one tablet by mouth once daily    Administration:   ??? Take with or without food  ??? 2 hours before or 6 hours after cation-containing antacids or laxatives, sucralfate, oral supplements containing iron or calcium, or buffered medications. Alternatively, dolutegravir/lamivudine and supplements containing calcium or iron can be taken together with food.    Adherence/Missed dose instructions: take missed dose as soon as you remember. If it is close to the time of your next dose, skip the dose and resume with your next scheduled dose.    Goals of Therapy     The goal of therapy is to prevent HIV replication and keep HIV at a non-detectable level on lab tests    Side Effects & Monitoring Parameters   ??? Headache.  ??? Diarrhea.  ??? Upset stomach.  ??? Trouble sleeping.  ??? Feeling tired or weak    The following side effects should be reported to the provider:  ??? Signs of an allergic reaction, like rash; hives; itching; red, swollen, blistered, or peeling skin with or without fever; wheezing; tightness in the chest or throat; trouble breathing, swallowing, or talking; unusual hoarseness; or swelling of the mouth, face, lips, tongue, or throat.  ??? Signs of liver problems like dark urine, feeling tired, not hungry, upset stomach or stomach pain, light-colored stools, throwing up, or yellow skin or eyes.  ??? Signs of too much lactic acid in the blood (lactic acidosis) like fast breathing, fast heartbeat, a heartbeat that does not feel normal, very bad upset stomach or throwing up, feeling very sleepy, shortness of breath, feeling very tired or weak, very bad dizziness, feeling cold, or muscle pain or cramps.  ??? Signs of a pancreas problem (pancreatitis) like very bad stomach pain, very bad back pain, or very bad upset stomach or throwing up.  ??? Fever.  ??? Muscle or joint pain.  ??? Mouth sores.  ??? Eye irritation.  ??? Shortness of breath.  ??? Changes in your immune system can happen when you start taking drugs to treat HIV. If you have an infection that you did not know you had, it may show up when you take this drug. Tell your doctor right away if you have any new signs after you start this drug, even after taking it for several months. This includes signs of infection like fever, sore throat, weakness, cough, or shortness of breath.    Contraindications, Warnings, & Precautions     ??? Lactic acidosis  ??? Immune reconstitution syndrome  ??? Hypersensitivity reactions  ??? Renal Impairment: not recommended in patients with CrCl <50 ml/min  ??? Chronic hepatitis B: [US Boxed Warning]: Severe acute exacerbations of hepatitis B virus (HBV) have been reported in patients who are co-infected with HIV-1 and HBV and have discontinued lamivudine. Closely monitor hepatic function in these patients and,  if appropriate, initiate anti-HBV treatment  ??? Hepatic impairment: Not recommended for use in patients with severe hepatic impairment (Child-Pugh class C)  ??? HBV resistance [US Boxed Warning]: All patients with HIV-1 should be tested for the presence of HBV prior to or when initiating dolutegravir/lamivudine. Emergence of lamivudine-resistant HBV variants in patients receiving lamivudine-containing antiretroviral regimens has been reported. If dolutegravir/lamivudine is used in patients co-infected with HIV-1 and HBV, additional treatment should be considered for appropriate treatment of chronic HBV; otherwise, consider an alternative regimen.   ??? Concurrent use with dofetelide    Drug/Food Interactions     ??? Medication list reviewed in Epic. The patient was instructed to inform the care team before taking any new medications or supplements. No drug interactions identified.       Storage, Handling Precautions, & Disposal     ??? Store at room temperature in a dry place. Do not store in a bathroom.  ??? Keep all drugs in a safe place. Keep all drugs out of the reach of children and pets.  ??? Throw away unused or expired drugs. Do not flush down a toilet or pour down a drain unless you are told to do so. Check with your pharmacist if you have questions about the best way to throw out drugs. There may be drug take-back programs in your area      Current Medications (including OTC/herbals), Comorbidities and Allergies     Current Outpatient Medications   Medication Sig Dispense Refill   ??? allopurinol (ZYLOPRIM) 100 MG tablet Take 100 mg by mouth.     ??? amLODIPine (NORVASC) 10 MG tablet Take 10 mg by mouth daily.     ??? ascorbic acid, vitamin C, (VITAMIN C) 1000 MG tablet Take 1,000 mg by mouth.     ??? aspirin 325 MG delayed release (EC) tablet Take 325 mg by mouth.     ??? aspirin-acetaminophen-caffeine (EXCEDRIN MIGRAINE) 250-250-65 mg per tablet Take 1 tablet by mouth.     ??? blood pressure test kit-large Kit 1 mL by Miscellaneous route once daily. 1 each 0   ??? BYSTOLIC 20 mg Tab      ??? dolutegravir-lamivudine ([name]) 50-300 mg Tab Take 1 tablet by mouth daily. 30 tablet 11   ??? ibuprofen (ADVIL,MOTRIN) 200 MG tablet Take 400 mg by mouth.     ??? metoprolol succinate (TOPROL-XL) 100 MG 24 hr tablet 1 tablet     ??? rosuvastatin (CRESTOR) 10 MG tablet Take 10 mg by mouth daily.     ??? sacubitriL-valsartan (ENTRESTO) 97-103 mg tablet Take 1 tablet by mouth two (2) times a day.     ??? traZODone (DESYREL) 50 MG tablet Take 25 mg by mouth.       No current facility-administered medications for this visit.       Allergies   Allergen Reactions   ??? Enalapril Cough       Patient Active Problem List   Diagnosis   ??? Human immunodeficiency virus (HIV) disease (CMS-HCC)   ??? HTN (hypertension)   ??? Pain, dental   ??? Echocardiogram abnormal   ??? Abnormal electrocardiogram   ??? Chest pain   ??? Dyspnea on exertion   ??? Hyperlipidemia   ??? Malignant hypertension   ??? Hypertensive urgency   ??? Cardiomyopathy (CMS-HCC)   ??? Temporary cerebral vascular dysfunction   ??? AIN (anal intraepithelial neoplasia) anal canal       Reviewed and up to date in Epic.    Appropriateness of Therapy  Acute infections noted within Epic:  No active infections  Patient reported infection: None    Is medication and dose appropriate based on diagnosis and infection status? Yes    Prescription has been clinically reviewed: Yes      Baseline Quality of Life Assessment      How many days over the past month did your HIV  keep you from your normal activities? For example, brushing your teeth or getting up in the morning. 0    Financial Information     Medication Assistance provided: Copay Assistance    Anticipated copay of $0.00 reviewed with patient. Verified delivery address.    Delivery Information     Scheduled delivery date: 05/26/21    Expected start date: 05/26/21    Medication will be delivered via UPS to the prescription address in [facility]  This shipment will not require a signature.      Explained the services we provide at [facility] and that each month we would call to set up refills.  Stressed importance of returning phone calls so that we could ensure they receive their medications in time each month.  Informed patient that we should be setting up refills 7-10 days prior to when they will run out of medication.  A pharmacist will reach out to perform a clinical assessment periodically.  Informed patient that a welcome packet, containing information about our pharmacy and other support services, a Notice of Privacy Practices, and a drug information handout will be sent.      The patient or caregiver noted above participated in the development of this care plan and knows that they can request review of or adjustments to the care plan at any time.      Patient or caregiver verbalized understanding of the above information as well as how to contact the pharmacy at [phone] option 4 with any questions/concerns.  The pharmacy is open Monday through Friday 8:30am-4:30pm.  A pharmacist is available 24/7 via pager to answer any clinical questions they may have.    Patient Specific Needs     - Does the patient have any physical, cognitive, or cultural barriers? No    - Does the patient have adequate living arrangements? (i.e. the ability to store and take their medication appropriately) Yes    - Did you identify any home environmental safety or security hazards? No    - Patient prefers to have medications discussed with  Patient     - Is the patient or caregiver able to read and understand education materials at a high school level or above? Yes    - Patient's primary language is  English     - Is the patient high risk? No    - Does the patient require physician intervention or other additional services (i.e. dietary/nutrition, smoking cessation, social work)? No      Roderic Palau  [facility] Specialty Pharmacist

## 2021-05-26 DIAGNOSIS — B20 Human immunodeficiency virus [HIV] disease: Principal | ICD-10-CM

## 2021-05-26 NOTE — Unmapped (Signed)
Called patient to discuss results of recent labwork, including elevated TG on non-fasting lipid panel. Pt reports family hx of hypercholesterolemia. Discussed plan to repeat with fasting test.     Pt reports he should be getting his [name] today. Will plan to repeat monitoring labs (and add on fasting lipid panel) at [facility] in about 4 weeks.

## 2021-05-28 LAB — HLA-B*5701: HLA-B*5701: NEGATIVE

## 2021-06-16 ENCOUNTER — Ambulatory Visit: Admit: 2021-06-16 | Payer: PRIVATE HEALTH INSURANCE

## 2021-06-17 NOTE — Unmapped (Signed)
[facility] Clinical Assessment & Refill Coordination Note    Benjamin Ortiz, DOB: 15-Jun-1981  Phone: [phone] (home)     All above HIPAA information was verified with patient.     Was a Nurse, learning disability used for this call? No    Specialty Medication(s):   Infectious Disease: [name]     Current Outpatient Medications   Medication Sig Dispense Refill   ??? allopurinol (ZYLOPRIM) 100 MG tablet Take 100 mg by mouth.     ??? amLODIPine (NORVASC) 10 MG tablet Take 10 mg by mouth daily.     ??? ascorbic acid, vitamin C, (VITAMIN C) 1000 MG tablet Take 1,000 mg by mouth.     ??? aspirin 325 MG delayed release (EC) tablet Take 325 mg by mouth.     ??? aspirin-acetaminophen-caffeine (EXCEDRIN MIGRAINE) 250-250-65 mg per tablet Take 1 tablet by mouth.     ??? blood pressure test kit-large Kit 1 mL by Miscellaneous route once daily. 1 each 0   ??? BYSTOLIC 20 mg Tab      ??? dolutegravir-lamivudine ([name]) 50-300 mg Tab Take 1 tablet by mouth daily. 30 tablet 11   ??? ibuprofen (ADVIL,MOTRIN) 200 MG tablet Take 400 mg by mouth.     ??? metoprolol succinate (TOPROL-XL) 100 MG 24 hr tablet 1 tablet     ??? rosuvastatin (CRESTOR) 10 MG tablet Take 10 mg by mouth daily.     ??? sacubitriL-valsartan (ENTRESTO) 97-103 mg tablet Take 1 tablet by mouth two (2) times a day.     ??? traZODone (DESYREL) 50 MG tablet Take 25 mg by mouth.       No current facility-administered medications for this visit.        Changes to medications: Shamel reports no changes at this time.    Allergies   Allergen Reactions   ??? Enalapril Cough       Changes to allergies: No    SPECIALTY MEDICATION ADHERENCE     [name] 50-300 mg: approximately 8 days of medicine on hand       Medication Adherence    Patient reported X missed doses in the last month: 0  Specialty Medication: [name] 50-300mg   Patient is on additional specialty medications: No  Demonstrates understanding of importance of adherence: yes  Informant: patient  Provider-estimated medication adherence level: good  Patient is at risk for Non-Adherence: No          Specialty medication(s) dose(s) confirmed: Regimen is correct and unchanged.     Are there any concerns with adherence? No    Adherence counseling provided? Not needed    CLINICAL MANAGEMENT AND INTERVENTION      Clinical Benefit Assessment:    Do you feel the medicine is effective or helping your condition? Yes    HIV ASSOCIATED LABS:     Lab Results   Component Value Date/Time    HIVRS Detected (A) 05/19/2021 04:43 PM    HIVRS Not Detected 06/02/2020 11:03 AM    HIVRS Not Detected 06/11/2019 11:45 AM    HIVRS Not Detected 08/19/2014 12:24 PM    HIVRS Not Detected 02/04/2014 11:08 AM    HIVRS Detected 01/22/2013 02:27 PM    HIVCP <40 (H) 05/19/2021 04:43 PM    HIVCP <40 12/18/2019 12:01 PM    HIVCP <40 (H) 02/06/2018 10:31 AM    HIVCP <40 (H) 04/25/2017 11:09 AM    HIVCP <40 01/22/2013 02:27 PM    HIVCP < 40 12/06/2011 02:03 PM  ACD4 1,269 05/19/2021 04:43 PM    ACD4 980 06/02/2020 11:03 AM    ACD4 1,149 06/11/2019 11:45 AM    ACD4 860 08/19/2014 12:24 PM    ACD4 802 02/04/2014 11:08 AM    ACD4 1,143 01/22/2013 02:27 PM       Clinical Benefit counseling provided? Not needed    Adverse Effects Assessment:    Are you experiencing any side effects? No    Are you experiencing difficulty administering your medicine? No    Quality of Life Assessment:      How many days over the past month did your HIV  keep you from your normal activities? For example, brushing your teeth or getting up in the morning. 0    Have you discussed this with your provider? Not needed    Acute Infection Status:    Acute infections noted within Epic:  No active infections  Patient reported infection: None    Therapy Appropriateness:    Is therapy appropriate and patient progressing towards therapeutic goals? Yes, therapy is appropriate and should be continued    DISEASE/MEDICATION-SPECIFIC INFORMATION      N/A    PATIENT SPECIFIC NEEDS     - Does the patient have any physical, cognitive, or cultural barriers? No    - Is the patient high risk? No    - Does the patient require a Care Management Plan? No     - Does the patient require physician intervention or other additional services (i.e. nutrition, smoking cessation, social work)? No      SHIPPING     Specialty Medication(s) to be Shipped:   Infectious Disease: [name]    Other medication(s) to be shipped: No additional medications requested for fill at this time     Changes to insurance: No    Delivery Scheduled: Yes, Expected medication delivery date: 06/23/21.     Medication will be delivered via UPS to the confirmed prescription address in [facility].    The patient will receive a drug information handout for each medication shipped and additional FDA Medication Guides as required.  Verified that patient has previously received a Conservation officer, historic buildings and a Surveyor, mining.    The patient or caregiver noted above participated in the development of this care plan and knows that they can request review of or adjustments to the care plan at any time.      All of the patient's questions and concerns have been addressed.    Roderic Palau   [facility] Specialty Pharmacist

## 2021-06-22 MED FILL — DOVATO 50 MG-300 MG TABLET: ORAL | 30 days supply | Qty: 30 | Fill #1

## 2021-07-04 ENCOUNTER — Other Ambulatory Visit: Payer: Self-pay | Admitting: Cardiology

## 2021-07-04 DIAGNOSIS — I110 Hypertensive heart disease with heart failure: Secondary | ICD-10-CM

## 2021-07-27 NOTE — Unmapped (Signed)
[facility] Refill Coordination Note    Specialty Medication(s) to be Shipped:   Infectious Disease: [name]    Other medication(s) to be shipped: No additional medications requested for fill at this time     Renne Platts, DOB: 28-Jun-1981  Phone: [phone] (home)       All above HIPAA information was verified with patient.     Was a Nurse, learning disability used for this call? No    Completed refill call assessment today to schedule patient's medication shipment from the [facility] [phone]).  All relevant notes have been reviewed.     Specialty medication(s) and dose(s) confirmed: Regimen is correct and unchanged.   Changes to medications: Edker reports no changes at this time.  Changes to insurance: No  New side effects reported not previously addressed with a pharmacist or physician: None reported  Questions for the pharmacist: No    Confirmed patient received a Conservation officer, historic buildings and a Surveyor, mining with first shipment. The patient will receive a drug information handout for each medication shipped and additional FDA Medication Guides as required.       DISEASE/MEDICATION-SPECIFIC INFORMATION        N/A    SPECIALTY MEDICATION ADHERENCE     Medication Adherence    Patient reported X missed doses in the last month: 0  Specialty Medication: [name]  Patient is on additional specialty medications: No        [name]  8 days worth of medication on hand.        Were doses missed due to medication being on hold? No      REFERRAL TO PHARMACIST     Referral to the pharmacist: Not needed      [facility]     Shipping address confirmed in Epic.     Delivery Scheduled: Yes, Expected medication delivery date: 07/29/21.     Medication will be delivered via UPS to the prescription address in Epic [facility].    [place] A Karron Goens   [facility] Specialty Technician

## 2021-07-28 MED FILL — DOVATO 50 MG-300 MG TABLET: ORAL | 30 days supply | Qty: 30 | Fill #2

## 2021-08-03 NOTE — Patient Instructions (Addendum)
Please continue using your CPAP regularly. While your insurance requires that you use CPAP at least 4 hours each night on 70% of the nights, I recommend, that you not skip any nights and use it throughout the night if you can. Getting used to CPAP and staying with the treatment long term does take time and patience and discipline. Untreated obstructive sleep apnea when it is moderate to severe can have an adverse impact on cardiovascular health and raise her risk for heart disease, arrhythmias, hypertension, congestive heart failure, stroke and diabetes. Untreated obstructive sleep apnea causes sleep disruption, nonrestorative sleep, and sleep deprivation. This can have an impact on your day to day functioning and cause daytime sleepiness and impairment of cognitive function, memory loss, mood disturbance, and problems focussing. Using CPAP regularly can improve these symptoms.  Increase trazodone to 50mg  at bedtime. You could also split dose if that works better (25mg  at bedtime and the other 25mg  when you wake up).   Follow up in 6 months

## 2021-08-03 NOTE — Progress Notes (Signed)
PATIENT: Justin Cox DOB: 06-09-1981  REASON FOR VISIT: follow up HISTORY FROM: patient  Chief Complaint  Patient presents with   Follow-up    New room, alone. Feels cpap takes breathe away sometimes. Using every night, at least 4 hours or more each night.     HISTORY OF PRESENT ILLNESS:  08/04/21 ALL:  Justin Cox is a 40 y.o. male here today for follow up for OSA on CPAP.  He did get new nasal mask. He feels it works well. He is trying to work on compliance. He feels he must take his mask off during sleep. There are a few nights that he hasn't used. He is taking trazodone 25mg  daily. This does help get him to sleep but does not stay asleep. He wakes up after having a dream or to use the bathroom. He tolerates trazodone.       HISTORY: (copied from Dr Dohmeier's previous note)  Justin Cox is a 40 y.o. year old African American male patient and was originally seen upon referral on 02/03/2021 from Dr. 02/05/2021, MD for a sleep medicine CONSULTATION.  He follows up today after having undergone a home sleep test which confirmed the presence of severe apnea this sleep apnea reached an AHI of 37.3 without hypoxemia but was strongly dependent on REM sleep.  REM sleep dependent apnea is usually best treated with a CPAP and therefore a CPAP autotitration was prescribed.  I wanted to avoid a full facemask when I sent my order to the durable medical equipment company but he received a auto titration Machine through [employer] but for [employer] here in town.  He has used the machine 18 out of 30 days over 4 hours with mix of 60% compliance.  The average AHI or apnea hypo pnea index per hour of sleep is 1 which is severely good resolution.  However the compliance is still rather low.  He reports that he often woke up having the mask next to him, he was given a large full facemask and spite of my request to fit him with a nasal mask.  The patient has retrognathia so a push on the lower jaw by the rim of her full  facemask is actually contraindicated.  He is now looking forward to switch the mask possibly to a medium size but I still would very much want him to use a nasal or pillow if possible.  The mean pressure is 5.9 cmH2O the highest daily average pressure is 9 cmH2O the average leak is 15.4 L/min the average AHI is 0.9 without central apneas arising.  So I feel strongly that the settings are correct for what he needs but that we should give him the option of a normal full facemask.  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 I was told I snore".    Justin Cox is a [place] American male with a medical history of Asthma, CHF (congestive heart failure) (HCC) with non-ischemic dilated cardiomyopathy, Headache, High cholesterol, HIV infection (HCC), Hypertension, TIA (transient ischemic attack), and Umbilical hernia.   Sleep relevant medical history: Insomnia, onset at least 10 years ago. Night terrors , until age 56.  Family medical /sleep history: No other family member on CPAP with OSA, some older cousins with insomnia, no sleep walkers.    Social history:  Patient is working as 10 at [employer]-  and lives in a household alone. The patient currently works/in late day shifts -alternating with early shifts- up to 2 PM a up to 11 PM. Tobacco use; quit 10 years ago . ETOH use ; socially. Caffeine intake in form of Coffee(/) Soda( pepsi 4-6 a day) Tea ( dinner - lipton sweet tea) ,no energy drinks. Regular exercise in form of walking.      Sleep habits are as follows: The patient's dinner time is variable -between 4-8  PM. The patient goes to bed at 11 PM and continues to struggle to sleep for one hour- sleep for intervals of 1-2  hours, wakes for unknown reasons, dreams may play a role.    The preferred sleep position is side and supine , with the support of 2 pillows. The bedroom is  cool, quiet and dark.  Dreams are reportedly frequent/vivid. Some of them are nightmares- palpitations, emotionally upsetting.   4 AM is the usual rise time for early shifts and 9 AM for the late shift-. The patient wakes up spontaneously on late shift days- with an alarm on early shift days.  He reports not feeling refreshed or restored in AM, with symptoms such as dry mouth, but no morning headaches - and residual fatigue. He feels stiff and weak.  Naps are taken infrequently on days off , lasting from 1-3.5 hourss and are more refreshing than nocturnal sleep.   REVIEW OF SYSTEMS: Out of a complete 14 system review of symptoms, the patient complains only of the following symptoms, insomnia and all other reviewed systems are negative.  ESS: 5  ALLERGIES: Allergies  Allergen Reactions   Enalapril Cough    HOME MEDICATIONS: Outpatient Medications Prior to Visit  Medication Sig Dispense Refill   allopurinol (ZYLOPRIM) 100 MG tablet Take 100 mg by mouth daily.     amLODipine (NORVASC) 10 MG tablet Take 1 tablet by mouth once daily 90 tablet 0   Ascorbic Acid (VITAMIN C) 1000 MG tablet Take 1,000 mg by mouth daily.     aspirin EC 325 MG EC tablet Take 1 tablet (325 mg total) by mouth daily. 30 tablet 0   ENTRESTO 97-103 MG Take 1 tablet by mouth twice daily 180 tablet 1   Nebivolol HCl 20 MG TABS TAKE 1 TABLET BY MOUTH ONCE DAILY (NEEDS  APPOINTMENT  FOR  FURTHER  REFILLS) 30 tablet 0   rosuvastatin (CRESTOR) 10 MG tablet TAKE 1 TABLET BY MOUTH ONCE DAILY IN THE MORNING 90 tablet 0   traZODone (DESYREL) 50 MG tablet Take 0.5 tablets (25 mg total) by mouth at bedtime as needed for sleep. 45 tablet 1   BIKTARVY 50-200-25 MG TABS tablet Take 1 tablet by mouth every evening.  11   chlorthalidone (HYGROTON) 25 MG tablet Take 1 tablet (25 mg total) by mouth every morning. 30 tablet 2   No facility-administered medications prior to visit.    PAST MEDICAL HISTORY: Past Medical History:   Diagnosis Date   Asthma    CHF (congestive heart failure) (HCC)    Headache    High cholesterol    HIV infection (HCC)    Hypertension    TIA (transient ischemic attack)    Umbilical hernia     PAST SURGICAL HISTORY: Past Surgical History:  Procedure Laterality Date   INSERTION OF MESH N/A 08/30/2018   Procedure: INSERTION OF MESH;  Surgeon: Manus Rudd, MD;  Location: [facility] OR;  Service: General;  Laterality: N/A;   LEFT HEART CATHETERIZATION WITH CORONARY  ANGIOGRAM N/A 06/30/2014   Procedure: LEFT HEART CATHETERIZATION WITH CORONARY ANGIOGRAM;  Surgeon: [name], MD;  Location: [facility] CATH LAB;  Service: Cardiovascular;  Laterality: N/A;   UMBILICAL HERNIA REPAIR N/A 08/30/2018   Procedure: HERNIA REPAIR UMBILICAL ADULT ERAS PATHWAY;  Surgeon: Manus Rudd, MD;  Location: [facility] OR;  Service: General;  Laterality: N/A;    FAMILY HISTORY: Family History  Problem Relation Age of Onset   Hypertension Mother    Diabetes Father    Hyperlipidemia Father    Hypertension Father    Hypertension Other    Hyperlipidemia Other    Diabetes Other    Hypertension Brother     SOCIAL HISTORY: Social History   Socioeconomic History   Marital status: Single    Spouse name: Not on file   Number of children: 0   Years of education: 12th   Highest education level: Not on file  Occupational History    Employer: [id number]  Tobacco Use   Smoking status: Former    Years: 6.00    Types: Cigarettes    Quit date: 10/20/2010    Years since quitting: 10.7   Smokeless tobacco: Never   Tobacco comments:    smoked about 2 packs per week (when used to smoke)  Vaping Use   Vaping Use: Never used  Substance and Sexual Activity   Alcohol use: Yes    Alcohol/week: 3.0 standard drinks    Types: 3 Standard drinks or equivalent per week    Comment: Occasional   Drug use: No   Sexual activity: Not on file  Other Topics Concern   Not on file  Social History Narrative   Patient is single with  no children.   Patient is right handed.   Patient has hs education.   Patient drinks 1 cup daily.   Social Determinants of Health   Financial Resource Strain: Not on file  Food Insecurity: Not on file  Transportation Needs: Not on file  Physical Activity: Not on file  Stress: Not on file  Social Connections: Not on file  Intimate Partner Violence: Not on file     PHYSICAL EXAM  Vitals:   08/04/21 1118  BP: (!) 156/94  Pulse: 75  SpO2: 98%  Weight: 217 lb 8 oz (98.7 kg)  Height: 5\' 11"  (1.803 m)   Body mass index is 30.34 kg/m.  Generalized: Well developed, in no acute distress  Cardiology: normal rate and rhythm, no murmur noted Respiratory: clear to auscultation bilaterally  Neurological examination  Mentation: Alert oriented to time, place, history taking. Follows all commands speech and language fluent Cranial nerve II-XII: Pupils were equal round reactive to light. Extraocular movements were full, visual field were full  Motor: The motor testing reveals 5 over 5 strength of all 4 extremities. Good symmetric motor tone is noted throughout.  Gait and station: Gait is normal.    DIAGNOSTIC DATA (LABS, IMAGING, TESTING) - I reviewed patient records, labs, notes, testing and imaging myself where available.  No flowsheet data found.   Lab Results  Component Value Date   WBC 5.1 08/30/2018   HGB 15.7 08/30/2018   HCT 49.3 08/30/2018   MCV 103.6 (H) 08/30/2018   PLT 156 08/30/2018      Component Value Date/Time   NA 134 09/01/2020 1139   K 3.9 09/01/2020 1139   CL 95 (L) 09/01/2020 1139   CO2 25 09/01/2020 1139   GLUCOSE 113 (H) 09/01/2020 1139   GLUCOSE 96 08/30/2018 7711  BUN 18 09/01/2020 1139   CREATININE 1.15 09/01/2020 1139   CREATININE 0.92 06/27/2014 1438   CALCIUM 9.8 09/01/2020 1139   PROT 8.5 09/01/2020 1139   ALBUMIN 4.6 09/01/2020 1139   AST 22 09/01/2020 1139   ALT 31 09/01/2020 1139   ALKPHOS 57 09/01/2020 1139   BILITOT 0.3 09/01/2020  1139   GFRNONAA 80 09/01/2020 1139   GFRAA 92 09/01/2020 1139   Lab Results  Component Value Date   CHOL 176 09/01/2020   HDL 30 (L) 09/01/2020   LDLCALC 42 09/01/2020   TRIG 747 (HH) 09/01/2020   CHOLHDL 5.2 04/11/2014   Lab Results  Component Value Date   HGBA1C 5.6 04/11/2014   Lab Results  Component Value Date   VITAMINB12 336 04/11/2014   Lab Results  Component Value Date   TSH 0.761 12/18/2019     ASSESSMENT AND PLAN 40 y.o. year old male  has a past medical history of Asthma, CHF (congestive heart failure) (HCC), Headache, High cholesterol, HIV infection (HCC), Hypertension, TIA (transient ischemic attack), and Umbilical hernia. here with     ICD-10-CM   1. Severe obstructive sleep apnea  G47.33     2. OSA on CPAP  G47.33    Z99.89         Ladonte Verstraete is doing well on CPAP therapy. Compliance report reveals excellent daily and sub optimal 4 hour compliance. I will have him increase trazodone to 50mg  at bedtime to see if this will help him stay asleep. May consider melatonin as well. He was encouraged to continue using CPAP nightly and for greater than 4 hours each night. Risks of untreated sleep apnea review and education materials provided. Healthy lifestyle habits encouraged. He will follow up in 6 months, sooner if needed. He verbalizes understanding and agreement with this plan.    No orders of the defined types were placed in this encounter.    No orders of the defined types were placed in this encounter.     , FNP-C 08/04/2021, 12:09 PM [facility] [address], [address] [place], [place] [phone]

## 2021-08-04 ENCOUNTER — Encounter: Payer: Self-pay | Admitting: Family Medicine

## 2021-08-04 ENCOUNTER — Ambulatory Visit: Payer: BC Managed Care – PPO | Admitting: Family Medicine

## 2021-08-04 VITALS — BP 156/94 | HR 75 | Ht 71.0 in | Wt 217.5 lb

## 2021-08-04 DIAGNOSIS — G4733 Obstructive sleep apnea (adult) (pediatric): Secondary | ICD-10-CM | POA: Diagnosis not present

## 2021-08-04 DIAGNOSIS — Z9989 Dependence on other enabling machines and devices: Secondary | ICD-10-CM | POA: Diagnosis not present

## 2021-08-04 MED ORDER — TRAZODONE HCL 50 MG PO TABS: 25.0000 mg | ORAL_TABLET | Freq: Every evening | ORAL | 1 refills | Status: DC | PRN

## 2021-08-16 NOTE — Unmapped (Signed)
Attempted to call patient again to remind him of getting monitoring labs after switching ART. Also needs fasting tests for TG >540 at last check. Mailbox full. Will continue to try and reach pt.

## 2021-08-17 ENCOUNTER — Other Ambulatory Visit: Payer: Self-pay | Admitting: Cardiology

## 2021-08-17 DIAGNOSIS — I110 Hypertensive heart disease with heart failure: Secondary | ICD-10-CM

## 2021-08-17 DIAGNOSIS — I5042 Chronic combined systolic (congestive) and diastolic (congestive) heart failure: Secondary | ICD-10-CM

## 2021-08-17 NOTE — Unmapped (Signed)
Text sent to reschedule cancelled appointment.

## 2021-08-19 NOTE — Unmapped (Signed)
Benjamin Ortiz, CMA called patient and scheduled second monkeypox vaccine. Patient scheduled for nurse visit on 08/24/21 @ 1:PM.

## 2021-08-19 NOTE — Unmapped (Signed)
VM is full, text sent, and MY Chart message sent to reschedule.

## 2021-08-22 ENCOUNTER — Other Ambulatory Visit: Payer: Self-pay | Admitting: Cardiology

## 2021-08-22 DIAGNOSIS — I110 Hypertensive heart disease with heart failure: Secondary | ICD-10-CM

## 2021-09-01 ENCOUNTER — Other Ambulatory Visit: Payer: Self-pay | Admitting: Cardiology

## 2021-09-01 DIAGNOSIS — I428 Other cardiomyopathies: Secondary | ICD-10-CM

## 2021-09-02 MED FILL — DOVATO 50 MG-300 MG TABLET: ORAL | 30 days supply | Qty: 30 | Fill #3

## 2021-09-02 NOTE — Unmapped (Signed)
[facility] Refill Coordination Note    Specialty Medication(s) to be Shipped:   Infectious Disease: [name]    Other medication(s) to be shipped: No additional medications requested for fill at this time     Benjamin Ortiz, DOB: 07-27-1981  Phone: [phone] (home)       All above HIPAA information was verified with patient.     Was a Nurse, learning disability used for this call? No    Completed refill call assessment today to schedule patient's medication shipment from the [facility] [phone]).  All relevant notes have been reviewed.     Specialty medication(s) and dose(s) confirmed: Regimen is correct and unchanged.   Changes to medications: Benjamin Ortiz reports no changes at this time.  Changes to insurance: No  New side effects reported not previously addressed with a pharmacist or physician: None reported  Questions for the pharmacist: No    Confirmed patient received a Conservation officer, historic buildings and a Surveyor, mining with first shipment. The patient will receive a drug information handout for each medication shipped and additional FDA Medication Guides as required.       DISEASE/MEDICATION-SPECIFIC INFORMATION        N/A    SPECIALTY MEDICATION ADHERENCE     Medication Adherence    Patient reported X missed doses in the last month: 0  Specialty Medication: [name]  Patient is on additional specialty medications: No  Any gaps in refill history greater than 2 weeks in the last 3 months: no  Demonstrates understanding of importance of adherence: yes  Informant: patient  Reliability of informant: reliable  Confirmed plan for next specialty medication refill: delivery by pharmacy  Refills needed for supportive medications: not needed        [name]:  4 days worth of medication on hand.        Were doses missed due to medication being on hold? No      REFERRAL TO PHARMACIST     Referral to the pharmacist: Not needed      [facility]     Shipping address confirmed in Epic.     Delivery Scheduled: Yes, Expected medication delivery date: 09/03/2021.     Medication will be delivered via UPS to the prescription address in Epic WAM.    Benjamin Ortiz Benjamin Ortiz Benjamin Ortiz   [facility] Specialty Technician

## 2021-10-01 NOTE — Unmapped (Signed)
The [facility] has made a second and final attempt to reach this patient to refill the following medication: [name].      We have left voicemails on the following phone numbers: [phone] and have sent a MyChart message.    Dates contacted: 1/5, 1/13  Last scheduled delivery: shipped 12/15    The patient may be at risk of non-compliance with this medication. The patient should call the [facility] at [phone]  Option 4, then Option 2 (all other specialty patients) to refill medication.    Westley Gambles   [facility] Specialty Technician

## 2021-10-05 NOTE — Unmapped (Signed)
[facility] Refill Coordination Note    Specialty Medication(s) to be Shipped:   Infectious Disease: [name]    Other medication(s) to be shipped: No additional medications requested for fill at this time     Benjamin Ortiz, DOB: 08-26-1981  Phone: [phone] (home)       All above HIPAA information was verified with patient.     Was a Nurse, learning disability used for this call? No    Completed refill call assessment today to schedule patient's medication shipment from the [facility] [phone]).  All relevant notes have been reviewed.     Specialty medication(s) and dose(s) confirmed: Regimen is correct and unchanged.   Changes to medications: Benjamin Ortiz reports no changes at this time.  Changes to insurance: No  New side effects reported not previously addressed with a pharmacist or physician: None reported  Questions for the pharmacist: No    Confirmed patient received a Conservation officer, historic buildings and a Surveyor, mining with first shipment. The patient will receive a drug information handout for each medication shipped and additional FDA Medication Guides as required.       DISEASE/MEDICATION-SPECIFIC INFORMATION        N/A    SPECIALTY MEDICATION ADHERENCE     Medication Adherence    Patient reported X missed doses in the last month: 0  Specialty Medication: [name]              Were doses missed due to medication being on hold? No    dovato  : 5 days of medicine on hand       REFERRAL TO PHARMACIST     Referral to the pharmacist: Not needed      [facility]     Shipping address confirmed in Epic.     Delivery Scheduled: Yes, Expected medication delivery date: 1/20.     Medication will be delivered via UPS to the prescription address in Epic WAM.    Westley Gambles   [facility] Specialty Technician

## 2021-10-07 MED FILL — DOVATO 50 MG-300 MG TABLET: ORAL | 30 days supply | Qty: 30 | Fill #4

## 2021-10-14 ENCOUNTER — Other Ambulatory Visit: Payer: Self-pay | Admitting: Cardiology

## 2021-10-27 DIAGNOSIS — B20 Human immunodeficiency virus [HIV] disease: Principal | ICD-10-CM

## 2021-10-28 NOTE — Unmapped (Signed)
LM and sent text to schedule with Dr. Juel Burrow. Dr. Juel Burrow would like to see him on 3/15 if that works with his schedule. She will be taking fasting labs on that day, so she said that she could see him at 12:30 and then block her 4:00.

## 2021-11-09 MED FILL — DOVATO 50 MG-300 MG TABLET: ORAL | 30 days supply | Qty: 30 | Fill #5

## 2021-11-09 NOTE — Unmapped (Signed)
[facility] Clinical Assessment & Refill Coordination Note    Benjamin Ortiz, DOB: 07/02/1981  Phone: [phone] (home)     All above HIPAA information was verified with patient.     Was a Nurse, learning disability used for this call? No    Specialty Medication(s):   Infectious Disease: [name]     Current Outpatient Medications   Medication Sig Dispense Refill   ??? allopurinol (ZYLOPRIM) 100 MG tablet Take 100 mg by mouth.     ??? amLODIPine (NORVASC) 10 MG tablet Take 10 mg by mouth daily.     ??? ascorbic acid, vitamin C, (VITAMIN C) 1000 MG tablet Take 1,000 mg by mouth.     ??? aspirin 325 MG delayed release (EC) tablet Take 325 mg by mouth.     ??? aspirin-acetaminophen-caffeine (EXCEDRIN MIGRAINE) 250-250-65 mg per tablet Take 1 tablet by mouth.     ??? blood pressure test kit-large Kit 1 mL by Miscellaneous route once daily. 1 each 0   ??? BYSTOLIC 20 mg Tab      ??? dolutegravir-lamivudine ([name]) 50-300 mg Tab Take 1 tablet by mouth daily. 30 tablet 11   ??? ibuprofen (ADVIL,MOTRIN) 200 MG tablet Take 400 mg by mouth.     ??? metoprolol succinate (TOPROL-XL) 100 MG 24 hr tablet 1 tablet     ??? rosuvastatin (CRESTOR) 10 MG tablet Take 10 mg by mouth daily.     ??? sacubitriL-valsartan (ENTRESTO) 97-103 mg tablet Take 1 tablet by mouth two (2) times a day.     ??? traZODone (DESYREL) 50 MG tablet Take 25 mg by mouth.       No current facility-administered medications for this visit.        Changes to medications: Benjamin Ortiz reports no changes at this time.    Allergies   Allergen Reactions   ??? Enalapril Cough       Changes to allergies: No    SPECIALTY MEDICATION ADHERENCE     [name] 50-300 mg: 4 days of medicine on hand     Medication Adherence    Patient reported X missed doses in the last month: 0  Specialty Medication: [name] 50-300mg   Patient is on additional specialty medications: No  Any gaps in refill history greater than 2 weeks in the last 3 months: no  Demonstrates understanding of importance of adherence: yes  Informant: patient  Provider-estimated medication adherence level: good  Patient is at risk for Non-Adherence: No          Specialty medication(s) dose(s) confirmed: Regimen is correct and unchanged.     Are there any concerns with adherence? No    Adherence counseling provided? Not needed    CLINICAL MANAGEMENT AND INTERVENTION      Clinical Benefit Assessment:    Do you feel the medicine is effective or helping your condition? Yes    HIV ASSOCIATED LABS:     Lab Results   Component Value Date/Time    HIVRS Detected (A) 05/19/2021 04:43 PM    HIVRS Not Detected 06/02/2020 11:03 AM    HIVRS Not Detected 06/11/2019 11:45 AM    HIVRS Not Detected 08/19/2014 12:24 PM    HIVRS Not Detected 02/04/2014 11:08 AM    HIVRS Detected 01/22/2013 02:27 PM    HIVCP <40 (H) 05/19/2021 04:43 PM    HIVCP <40 12/18/2019 12:01 PM    HIVCP <40 (H) 02/06/2018 10:31 AM    HIVCP <40 (H) 04/25/2017 11:09 AM    HIVCP <  40 01/22/2013 02:27 PM    HIVCP < 40 12/06/2011 02:03 PM    ACD4 1,269 05/19/2021 04:43 PM    ACD4 980 06/02/2020 11:03 AM    ACD4 1,149 06/11/2019 11:45 AM    ACD4 860 08/19/2014 12:24 PM    ACD4 802 02/04/2014 11:08 AM    ACD4 1,143 01/22/2013 02:27 PM       Clinical Benefit counseling provided? Not needed    Adverse Effects Assessment:    Are you experiencing any side effects? No    Are you experiencing difficulty administering your medicine? No    Quality of Life Assessment:    How many days over the past month did your HIV  keep you from your normal activities? For example, brushing your teeth or getting up in the morning. 0    Have you discussed this with your provider? Not needed    Acute Infection Status:    Acute infections noted within Epic:  No active infections  Patient reported infection: None    Therapy Appropriateness:    Is therapy appropriate and patient progressing towards therapeutic goals? Yes, therapy is appropriate and should be continued    DISEASE/MEDICATION-SPECIFIC INFORMATION N/A    PATIENT SPECIFIC NEEDS     - Does the patient have any physical, cognitive, or cultural barriers? No    - Is the patient high risk? No    - Does the patient require a Care Management Plan? No       SHIPPING     Specialty Medication(s) to be Shipped:   Infectious Disease: [name]    Other medication(s) to be shipped: No additional medications requested for fill at this time     Changes to insurance: No    Delivery Scheduled: Yes, Expected medication delivery date: 11/10/21.     Medication will be delivered via UPS to the confirmed prescription address in [facility].    The patient will receive a drug information handout for each medication shipped and additional FDA Medication Guides as required.  Verified that patient has previously received a Conservation officer, historic buildings and a Surveyor, mining.    The patient or caregiver noted above participated in the development of this care plan and knows that they can request review of or adjustments to the care plan at any time.      All of the patient's questions and concerns have been addressed.    Roderic Palau   [facility] Specialty Pharmacist

## 2021-11-19 ENCOUNTER — Other Ambulatory Visit: Payer: Self-pay | Admitting: Cardiology

## 2021-11-19 DIAGNOSIS — I5042 Chronic combined systolic (congestive) and diastolic (congestive) heart failure: Secondary | ICD-10-CM

## 2021-11-19 DIAGNOSIS — I110 Hypertensive heart disease with heart failure: Secondary | ICD-10-CM

## 2021-12-07 NOTE — Unmapped (Signed)
The [facility] has made a second and final attempt to reach this patient to refill the following medication: [name].      We have left voicemails on the following phone numbers: [phone] and have sent a MyChart message. And a text     Dates contacted: 3/17, 3/21  Last scheduled delivery: shipped 2/21    The patient may be at risk of non-compliance with this medication. The patient should call the [facility] at [phone]  Option 4, then Option 2 (all other specialty patients) to refill medication.    Westley Gambles   [facility] Specialty Technician

## 2021-12-08 NOTE — Unmapped (Signed)
[facility] Refill Coordination Note    Specialty Medication(s) to be Shipped:   Infectious Disease: [name]    Other medication(s) to be shipped: No additional medications requested for fill at this time     Benjamin Ortiz, DOB: 11-May-1981  Phone: [phone] (home)       All above HIPAA information was verified with patient.     Was a Nurse, learning disability used for this call? No    Completed refill call assessment today to schedule patient's medication shipment from the [facility] [phone]).  All relevant notes have been reviewed.     Specialty medication(s) and dose(s) confirmed: Regimen is correct and unchanged.   Changes to medications: Benjamin Ortiz reports no changes at this time.  Changes to insurance: No  New side effects reported not previously addressed with a pharmacist or physician: None reported  Questions for the pharmacist: No    Confirmed patient received a Conservation officer, historic buildings and a Surveyor, mining with first shipment. The patient will receive a drug information handout for each medication shipped and additional FDA Medication Guides as required.       DISEASE/MEDICATION-SPECIFIC INFORMATION        N/A    SPECIALTY MEDICATION ADHERENCE     Medication Adherence    Patient reported X missed doses in the last month: 0  Specialty Medication: [name] 50-300 mg Tab (dolutegravir-lamivudine)  Patient is on additional specialty medications: No  Patient is on more than two specialty medications: No  Any gaps in refill history greater than 2 weeks in the last 3 months: no  Demonstrates understanding of importance of adherence: yes  Informant: patient  Reliability of informant: reliable  Confirmed plan for next specialty medication refill: delivery by pharmacy  Refills needed for supportive medications: not needed              Were doses missed due to medication being on hold? No    [name] 50-300 mg : 5 days of medicine on hand       REFERRAL TO PHARMACIST     Referral to the pharmacist: Not needed      [facility]     Shipping address confirmed in Epic.     Delivery Scheduled: Yes, Expected medication delivery date: 12/13/21.     Medication will be delivered via UPS to the prescription address in Epic WAM.    Benjamin Ortiz   [facility] Specialty Technician

## 2021-12-10 MED FILL — DOVATO 50 MG-300 MG TABLET: ORAL | 30 days supply | Qty: 30 | Fill #6

## 2022-01-03 NOTE — Unmapped (Signed)
[facility] Refill Coordination Note    Specialty Medication(s) to be Shipped:   Infectious Disease: [name]    Other medication(s) to be shipped: No additional medications requested for fill at this time     Benjamin Ortiz, DOB: 07-04-1981  Phone: [phone] (home)       All above HIPAA information was verified with patient.     Was a Nurse, learning disability used for this call? No    Completed refill call assessment today to schedule patient's medication shipment from the [facility] [phone]).  All relevant notes have been reviewed.     Specialty medication(s) and dose(s) confirmed: Regimen is correct and unchanged.   Changes to medications: Benjamin Ortiz reports no changes at this time.  Changes to insurance: No  New side effects reported not previously addressed with a pharmacist or physician: None reported  Questions for the pharmacist: No    Confirmed patient received a Conservation officer, historic buildings and a Surveyor, mining with first shipment. The patient will receive a drug information handout for each medication shipped and additional FDA Medication Guides as required.       DISEASE/MEDICATION-SPECIFIC INFORMATION        N/A    SPECIALTY MEDICATION ADHERENCE     Medication Adherence    Patient reported X missed doses in the last month: 0  Specialty Medication: dovato  Patient is on additional specialty medications: No              Were doses missed due to medication being on hold? No    [name] 50-300  mg: 5 days of medicine on hand        REFERRAL TO PHARMACIST     Referral to the pharmacist: Not needed      [facility]     Shipping address confirmed in Epic.     Delivery Scheduled: Yes, Expected medication delivery date: 01/05/22.     Medication will be delivered via UPS to the prescription address in Epic WAM.    Willette Pa   [facility] Specialty Technician

## 2022-01-04 MED FILL — DOVATO 50 MG-300 MG TABLET: ORAL | 30 days supply | Qty: 30 | Fill #7

## 2022-01-28 NOTE — Unmapped (Signed)
[facility] Refill Coordination Note    Specialty Medication(s) to be Shipped:   Infectious Disease: [name]    Other medication(s) to be shipped: No additional medications requested for fill at this time     Kengo Sturges, DOB: Jun 17, 1981  Phone: [phone] (home)       All above HIPAA information was verified with patient.     Was a Nurse, learning disability used for this call? No    Completed refill call assessment today to schedule patient's medication shipment from the [facility] [phone]).  All relevant notes have been reviewed.     Specialty medication(s) and dose(s) confirmed: Regimen is correct and unchanged.   Changes to medications: Keatyn reports no changes at this time.  Changes to insurance: No  New side effects reported not previously addressed with a pharmacist or physician: None reported  Questions for the pharmacist: No    Confirmed patient received a Conservation officer, historic buildings and a Surveyor, mining with first shipment. The patient will receive a drug information handout for each medication shipped and additional FDA Medication Guides as required.       DISEASE/MEDICATION-SPECIFIC INFORMATION        N/A    SPECIALTY MEDICATION ADHERENCE     Medication Adherence    Patient reported X missed doses in the last month: 0  Specialty Medication: [name] 50-300 mg              Were doses missed due to medication being on hold? No    dovato  : 6 days of medicine on hand       REFERRAL TO PHARMACIST     Referral to the pharmacist: Not needed      [facility]     Shipping address confirmed in Epic.     Delivery Scheduled: Yes, Expected medication delivery date: 5/18.     Medication will be delivered via UPS to the prescription address in Epic WAM.    Westley Gambles   [facility] Specialty Technician

## 2022-02-01 NOTE — Progress Notes (Deleted)
PATIENT: Justin Cox DOB: January 04, 1981  REASON FOR VISIT: follow up HISTORY FROM: patient  No chief complaint on file.    HISTORY OF PRESENT ILLNESS:  02/01/22 ALL:  Justin Cox returns for follow up for OSA on CPAP and insomnia. I saw him last 07/2021 and he was having difficulty getting to sleep using CPAP and admitted to removing his mask at night during sleep. We increased trazodone to 50mg  daily and encouraged compliance. Since,   08/04/2021 ALL:  Justin Cox is a 41 y.o. male here today for follow up for OSA on CPAP.  He did get new nasal mask. He feels it works well. He is trying to work on compliance. He feels he must take his mask off during sleep. There are a few nights that he hasn't used. He is taking trazodone 25mg  daily. This does help get him to sleep but does not stay asleep. He wakes up after having a dream or to use the bathroom. He tolerates trazodone.       HISTORY: (copied from Dr Dohmeier's previous note)  Justin Cox is a 41 y.o. year old African American male patient and was originally seen upon referral on 02/03/2021 from Dr. Jacinto Halim, MD for a sleep medicine CONSULTATION.  He follows up today after having undergone a home sleep test which confirmed the presence of severe apnea this sleep apnea reached an AHI of 37.3 without hypoxemia but was strongly dependent on REM sleep.  REM sleep dependent apnea is usually best treated with a CPAP and therefore a CPAP autotitration was prescribed.  I wanted to avoid a full facemask when I sent my order to the durable medical equipment company but he received a auto titration Machine through [employer] but for [facility] here in town.  He has used the machine 18 out of 30 days over 4 hours with mix of 60% compliance.  The average AHI or apnea hypo pnea index per hour of sleep is 1 which is severely good resolution.  However the compliance is still rather low.  He reports that he often woke up having the mask next to him, he was given a large  full facemask and spite of my request to fit him with a nasal mask.  The patient has retrognathia so a push on the lower jaw by the rim of her full facemask is actually contraindicated.  He is now looking forward to switch the mask possibly to a medium size but I still would very much want him to use a nasal or pillow if possible.  The mean pressure is 5.9 cmH2O the highest daily average pressure is 9 cmH2O the average leak is 15.4 L/min the average AHI is 0.9 without central apneas arising.  So I feel strongly that the settings are correct for what he needs but that we should give him the option of a normal full facemask.  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 I was told I snore".    Justin Cox is a [place] American male with a medical history of Asthma, CHF (congestive heart failure) (HCC) with non-ischemic dilated cardiomyopathy, Headache, High cholesterol, HIV infection (HCC), Hypertension, TIA (transient ischemic attack), and Umbilical hernia.   Sleep relevant medical history: Insomnia, onset at least 10 years ago. Night terrors , until age 19.  Family medical /sleep history: No other family member on CPAP with  OSA, some older cousins with insomnia, no sleep walkers.    Social history:  Patient is working as Production designer, theatre/television/film at [employer]-  and lives in a household alone. The patient currently works/in late day shifts -alternating with early shifts- up to 2 PM a up to 11 PM. Tobacco use; quit 10 years ago . ETOH use ; socially. Caffeine intake in form of Coffee(/) Soda( pepsi 4-6 a day) Tea ( dinner - lipton sweet tea) ,no energy drinks. Regular exercise in form of walking.      Sleep habits are as follows: The patient's dinner time is variable -between 4-8  PM. The patient goes to bed at 11 PM and continues to struggle to sleep for one hour- sleep for intervals of 1-2  hours, wakes  for unknown reasons, dreams may play a role.    The preferred sleep position is side and supine , with the support of 2 pillows. The bedroom is cool, quiet and dark.  Dreams are reportedly frequent/vivid. Some of them are nightmares- palpitations, emotionally upsetting.   4 AM is the usual rise time for early shifts and 9 AM for the late shift-. The patient wakes up spontaneously on late shift days- with an alarm on early shift days.  He reports not feeling refreshed or restored in AM, with symptoms such as dry mouth, but no morning headaches - and residual fatigue. He feels stiff and weak.  Naps are taken infrequently on days off , lasting from 1-3.5 hourss and are more refreshing than nocturnal sleep.   REVIEW OF SYSTEMS: Out of a complete 14 system review of symptoms, the patient complains only of the following symptoms, insomnia and all other reviewed systems are negative.  ESS: 5  ALLERGIES: Allergies  Allergen Reactions   Enalapril Cough    HOME MEDICATIONS: Outpatient Medications Prior to Visit  Medication Sig Dispense Refill   allopurinol (ZYLOPRIM) 100 MG tablet Take 100 mg by mouth daily.     amLODipine (NORVASC) 10 MG tablet Take 1 tablet by mouth once daily 90 tablet 0   Ascorbic Acid (VITAMIN C) 1000 MG tablet Take 1,000 mg by mouth daily.     aspirin EC 325 MG EC tablet Take 1 tablet (325 mg total) by mouth daily. 30 tablet 0   chlorthalidone (HYGROTON) 25 MG tablet Take 1 tablet (25 mg total) by mouth every morning. 30 tablet 2   ENTRESTO 97-103 MG Take 1 tablet by mouth twice daily 180 tablet 0   Nebivolol HCl 20 MG TABS TAKE 1 TABLET BY MOUTH ONCE DAILY (NEEDS  APPOINTMENT  FOR  FURTHER  REFILLS) 30 tablet 0   rosuvastatin (CRESTOR) 10 MG tablet TAKE 1 TABLET BY MOUTH ONCE DAILY IN THE MORNING (NEEDS  APPOINTMENT  FOR  FURTHER  REFILLS) 90 tablet 0   traZODone (DESYREL) 50 MG tablet Take 0.5 tablets (25 mg total) by mouth at bedtime as needed for sleep. 90 tablet 1    No facility-administered medications prior to visit.    PAST MEDICAL HISTORY: Past Medical History:  Diagnosis Date   Asthma    CHF (congestive heart failure) (HCC)    Headache    High cholesterol    HIV infection (HCC)    Hypertension    TIA (transient ischemic attack)    Umbilical hernia     PAST SURGICAL HISTORY: Past Surgical History:  Procedure Laterality Date   INSERTION OF MESH N/A 08/30/2018   Procedure: INSERTION OF MESH;  Surgeon: Manus Rudd, MD;  Location: [facility] OR;  Service: General;  Laterality: N/A;   LEFT HEART CATHETERIZATION WITH CORONARY ANGIOGRAM N/A 06/30/2014   Procedure: LEFT HEART CATHETERIZATION WITH CORONARY ANGIOGRAM;  Surgeon: [name], MD;  Location: [facility] CATH LAB;  Service: Cardiovascular;  Laterality: N/A;   UMBILICAL HERNIA REPAIR N/A 08/30/2018   Procedure: HERNIA REPAIR UMBILICAL ADULT ERAS PATHWAY;  Surgeon: Manus Rudd, MD;  Location[facility] OR;  Service: General;  Laterality: N/A;    FAMILY HISTORY: Family History  Problem Relation Age of Onset   Hypertension Mother    Diabetes Father    Hyperlipidemia Father    Hypertension Father    Hypertension Other    Hyperlipidemia Other    Diabetes Other    Hypertension Brother     SOCIAL HISTORY: Social History   Socioeconomic History   Marital status: Single    Spouse name: Not on file   Number of children: 0   Years of education: 12th   Highest education level: Not on file  Occupational History    Employer: [employer]  Tobacco Use   Smoking status: Former    Years: 6.00    Types: Cigarettes    Quit date: 10/20/2010    Years since quitting: 11.2   Smokeless tobacco: Never   Tobacco comments:    smoked about 2 packs per week (when used to smoke)  Vaping Use   Vaping Use: Never used  Substance and Sexual Activity   Alcohol use: Yes    Alcohol/week: 3.0 standard drinks    Types: 3 Standard drinks or equivalent per week    Comment: Occasional   Drug use: No   Sexual  activity: Not on file  Other Topics Concern   Not on file  Social History Narrative   Patient is single with no children.   Patient is right handed.   Patient has hs education.   Patient drinks 1 cup daily.   Social Determinants of Health   Financial Resource Strain: Not on file  Food Insecurity: Not on file  Transportation Needs: Not on file  Physical Activity: Not on file  Stress: Not on file  Social Connections: Not on file  Intimate Partner Violence: Not on file     PHYSICAL EXAM  There were no vitals filed for this visit.  There is no height or weight on file to calculate BMI.  Generalized: Well developed, in no acute distress  Cardiology: normal rate and rhythm, no murmur noted Respiratory: clear to auscultation bilaterally  Neurological examination  Mentation: Alert oriented to time, place, history taking. Follows all commands speech and language fluent Cranial nerve II-XII: Pupils were equal round reactive to light. Extraocular movements were full, visual field were full  Motor: The motor testing reveals 5 over 5 strength of all 4 extremities. Good symmetric motor tone is noted throughout.  Gait and station: Gait is normal.    DIAGNOSTIC DATA (LABS, IMAGING, TESTING) - I reviewed patient records, labs, notes, testing and imaging myself where available.      View : No data to display.           Lab Results  Component Value Date   WBC 5.1 08/30/2018   HGB 15.7 08/30/2018   HCT 49.3 08/30/2018   MCV 103.6 (H) 08/30/2018   PLT 156 08/30/2018      Component Value Date/Time   NA 134 09/01/2020 1139   K 3.9 09/01/2020 1139   CL 95 (L) 09/01/2020 1139   CO2 25 09/01/2020 1139  GLUCOSE 113 (H) 09/01/2020 1139   GLUCOSE 96 08/30/2018 0928   BUN 18 09/01/2020 1139   CREATININE 1.15 09/01/2020 1139   CREATININE 0.92 06/27/2014 1438   CALCIUM 9.8 09/01/2020 1139   PROT 8.5 09/01/2020 1139   ALBUMIN 4.6 09/01/2020 1139   AST 22 09/01/2020 1139   ALT 31  09/01/2020 1139   ALKPHOS 57 09/01/2020 1139   BILITOT 0.3 09/01/2020 1139   GFRNONAA 80 09/01/2020 1139   GFRAA 92 09/01/2020 1139   Lab Results  Component Value Date   CHOL 176 09/01/2020   HDL 30 (L) 09/01/2020   LDLCALC 42 09/01/2020   TRIG 747 (HH) 09/01/2020   CHOLHDL 5.2 04/11/2014   Lab Results  Component Value Date   HGBA1C 5.6 04/11/2014   Lab Results  Component Value Date   VITAMINB12 336 04/11/2014   Lab Results  Component Value Date   TSH 0.761 12/18/2019     ASSESSMENT AND PLAN 41 y.o. year old male  has a past medical history of Asthma, CHF (congestive heart failure) (HCC), Headache, High cholesterol, HIV infection (HCC), Hypertension, TIA (transient ischemic attack), and Umbilical hernia. here with     ICD-10-CM   1. OSA (obstructive sleep apnea)  G47.33     2. OSA on CPAP  G47.33    Z99.89     3. Insomnia, unspecified type  G47.00        Holten Spano is doing well on CPAP therapy. Compliance report reveals excellent daily and sub optimal 4 hour compliance. I will have him increase trazodone to 50mg  at bedtime to see if this will help him stay asleep. May consider melatonin as well. He was encouraged to continue using CPAP nightly and for greater than 4 hours each night. Risks of untreated sleep apnea review and education materials provided. Healthy lifestyle habits encouraged. He will follow up in 6 months, sooner if needed. He verbalizes understanding and agreement with this plan.    No orders of the defined types were placed in this encounter.     No orders of the defined types were placed in this encounter.      , FNP-C 02/01/2022, 11:18 AM [facility] [address], [address] [place], [place] [phone]

## 2022-02-01 NOTE — Patient Instructions (Incomplete)
Please continue using your CPAP regularly. While your insurance requires that you use CPAP at least 4 hours each night on 70% of the nights, I recommend, that you not skip any nights and use it throughout the night if you can. Getting used to CPAP and staying with the treatment long term does take time and patience and discipline. Untreated obstructive sleep apnea when it is moderate to severe can have an adverse impact on cardiovascular health and raise her risk for heart disease, arrhythmias, hypertension, congestive heart failure, stroke and diabetes. Untreated obstructive sleep apnea causes sleep disruption, nonrestorative sleep, and sleep deprivation. This can have an impact on your day to day functioning and cause daytime sleepiness and impairment of cognitive function, memory loss, mood disturbance, and problems focussing. Using CPAP regularly can improve these symptoms.  Follow up in  

## 2022-02-02 ENCOUNTER — Ambulatory Visit: Payer: BC Managed Care – PPO | Admitting: Family Medicine

## 2022-02-02 ENCOUNTER — Encounter: Payer: Self-pay | Admitting: Family Medicine

## 2022-02-02 DIAGNOSIS — G4733 Obstructive sleep apnea (adult) (pediatric): Secondary | ICD-10-CM

## 2022-02-02 DIAGNOSIS — G4700 Insomnia, unspecified: Secondary | ICD-10-CM

## 2022-02-02 MED FILL — DOVATO 50 MG-300 MG TABLET: ORAL | 30 days supply | Qty: 30 | Fill #8

## 2022-02-08 NOTE — Unmapped (Signed)
VM is full, sent text message, and My Chart message (doesn't look like those are read though). Needs to schedule follow up with Dr. Juel Burrow and needs fasting labs.

## 2022-03-01 ENCOUNTER — Other Ambulatory Visit: Payer: Self-pay | Admitting: Cardiology

## 2022-03-01 DIAGNOSIS — I428 Other cardiomyopathies: Secondary | ICD-10-CM

## 2022-03-02 NOTE — Unmapped (Unsigned)
The [facility] has made a second and final attempt to reach this patient to refill the following medication: [name].      We have been unable to leave messages on the following phone numbers: [phone]', have sent a MyChart message and have sent a text message to the following phone numbers: [phone].    Dates contacted: 6/9, 6/14  Last scheduled delivery: shipped 5/17    The patient may be at risk of non-compliance with this medication. The patient should call the [facility] at [phone]  Option 4, then Option 2 (all other specialty patients) to refill medication.    Benjamin Ortiz   [facility] Specialty Technician

## 2022-03-03 NOTE — Unmapped (Signed)
[facility] Refill Coordination Note    Specialty Medication(s) to be Shipped:   Infectious Disease: [name]    Other medication(s) to be shipped: No additional medications requested for fill at this time     Benjamin Ortiz, DOB: [birth year]  Phone: [phone] (home)       All above HIPAA information was verified with patient.     Was a Nurse, learning disability used for this call? No    Completed refill call assessment today to schedule patient's medication shipment from the [facility] [phone]).  All relevant notes have been reviewed.     Specialty medication(s) and dose(s) confirmed: Regimen is correct and unchanged.   Changes to medications: Benjamin Ortiz reports no changes at this time.  Changes to insurance: No  New side effects reported not previously addressed with a pharmacist or physician: None reported  Questions for the pharmacist: No    Confirmed patient received a Conservation officer, historic buildings and a Surveyor, mining with first shipment. The patient will receive a drug information handout for each medication shipped and additional FDA Medication Guides as required.       DISEASE/MEDICATION-SPECIFIC INFORMATION        N/A    SPECIALTY MEDICATION ADHERENCE     Medication Adherence    Patient reported X missed doses in the last month: 0  Specialty Medication: [name] 50-300 mg  Patient is on additional specialty medications: No              Were doses missed due to medication being on hold? No    [name] 50-300 mg: 8 days of medicine on hand        REFERRAL TO PHARMACIST     Referral to the pharmacist: Not needed      [facility]     Shipping address confirmed in Epic.     Delivery Scheduled: Yes, Expected medication delivery date: 03/08/22.     Medication will be delivered via UPS to the prescription address in Epic WAM.    Benjamin Ortiz   [facility] Specialty Technician

## 2022-03-07 MED FILL — DOVATO 50 MG-300 MG TABLET: ORAL | 30 days supply | Qty: 30 | Fill #9

## 2022-03-31 ENCOUNTER — Other Ambulatory Visit: Payer: Self-pay | Admitting: Cardiology

## 2022-03-31 DIAGNOSIS — I110 Hypertensive heart disease with heart failure: Secondary | ICD-10-CM

## 2022-03-31 DIAGNOSIS — I428 Other cardiomyopathies: Secondary | ICD-10-CM

## 2022-03-31 NOTE — Unmapped (Signed)
[facility] Clinical Assessment & Refill Coordination Note    Benjamin Ortiz, DOB: September 05, 1981  Phone: [phone] (home)     All above HIPAA information was verified with patient.     Was a Nurse, learning disability used for this call? No    Specialty Medication(s):   Infectious Disease: [name]     Current Outpatient Medications   Medication Sig Dispense Refill    allopurinol (ZYLOPRIM) 100 MG tablet Take 100 mg by mouth.      amLODIPine (NORVASC) 10 MG tablet Take 10 mg by mouth daily.      ascorbic acid, vitamin C, (VITAMIN C) 1000 MG tablet Take 1,000 mg by mouth.      aspirin 325 MG delayed release (EC) tablet Take 325 mg by mouth.      aspirin-acetaminophen-caffeine (EXCEDRIN MIGRAINE) 250-250-65 mg per tablet Take 1 tablet by mouth.      blood pressure test kit-large Kit 1 mL by Miscellaneous route once daily. 1 each 0    BYSTOLIC 20 mg Tab       dolutegravir-lamivudine ([name]) 50-300 mg Tab Take 1 tablet by mouth daily. 30 tablet 11    ibuprofen (ADVIL,MOTRIN) 200 MG tablet Take 400 mg by mouth.      metoprolol succinate (TOPROL-XL) 100 MG 24 hr tablet 1 tablet      rosuvastatin (CRESTOR) 10 MG tablet Take 10 mg by mouth daily.      sacubitriL-valsartan (ENTRESTO) 97-103 mg tablet Take 1 tablet by mouth two (2) times a day.      traZODone (DESYREL) 50 MG tablet Take 25 mg by mouth.       No current facility-administered medications for this visit.        Changes to medications: Doniel reports no changes at this time.    Allergies   Allergen Reactions    Enalapril Cough       Changes to allergies: No    SPECIALTY MEDICATION ADHERENCE     [name] 50-300 mg: 6 days of medicine on hand       Medication Adherence    Patient reported X missed doses in the last month: 0  Specialty Medication: [name] 50-300mg   Patient is on additional specialty medications: No  Any gaps in refill history greater than 2 weeks in the last 3 months: no  Demonstrates understanding of importance of adherence: yes  Informant: patient  Provider-estimated medication adherence level: good  Patient is at risk for Non-Adherence: No          Specialty medication(s) dose(s) confirmed: Regimen is correct and unchanged.     Are there any concerns with adherence? No    Adherence counseling provided? Not needed    CLINICAL MANAGEMENT AND INTERVENTION      Clinical Benefit Assessment:    Do you feel the medicine is effective or helping your condition? Yes    HIV ASSOCIATED LABS:     Lab Results   Component Value Date/Time    HIVRS Detected (A) 05/19/2021 04:43 PM    HIVRS Not Detected 06/02/2020 11:03 AM    HIVRS Not Detected 06/11/2019 11:45 AM    HIVRS Not Detected 08/19/2014 12:24 PM    HIVRS Not Detected 02/04/2014 11:08 AM    HIVRS Detected 01/22/2013 02:27 PM    HIVCP <40 (H) 05/19/2021 04:43 PM    HIVCP <40 12/18/2019 12:01 PM    HIVCP <40 (H) 02/06/2018 10:31 AM    HIVCP <40 (H) 04/25/2017 11:09 AM  HIVCP <40 01/22/2013 02:27 PM    HIVCP < 40 12/06/2011 02:03 PM    ACD4 1,269 05/19/2021 04:43 PM    ACD4 980 06/02/2020 11:03 AM    ACD4 1,149 06/11/2019 11:45 AM    ACD4 860 08/19/2014 12:24 PM    ACD4 802 02/04/2014 11:08 AM    ACD4 1,143 01/22/2013 02:27 PM       Clinical Benefit counseling provided? Labs from 05/19/21 show evidence of clinical benefit    Adverse Effects Assessment:    Are you experiencing any side effects? No    Are you experiencing difficulty administering your medicine? No    Quality of Life Assessment:    How many days over the past month did your HIV  keep you from your normal activities? For example, brushing your teeth or getting up in the morning. 0    Have you discussed this with your provider? Not needed    Acute Infection Status:    Acute infections noted within Epic:  No active infections  Patient reported infection: None    Therapy Appropriateness:    Is therapy appropriate and patient progressing towards therapeutic goals? Yes, therapy is appropriate and should be continued    DISEASE/MEDICATION-SPECIFIC INFORMATION      N/A    PATIENT SPECIFIC NEEDS     Does the patient have any physical, cognitive, or cultural barriers? No    Is the patient high risk? No    Does the patient require a Care Management Plan? No         SHIPPING     Specialty Medication(s) to be Shipped:   Infectious Disease: [name]    Other medication(s) to be shipped: No additional medications requested for fill at this time     Changes to insurance: No    Delivery Scheduled: Yes, Expected medication delivery date: 04/04/22.     Medication will be delivered via UPS to the confirmed prescription address in [facility].    The patient will receive a drug information handout for each medication shipped and additional FDA Medication Guides as required.  Verified that patient has previously received a Conservation officer, historic buildings and a Surveyor, mining.    The patient or caregiver noted above participated in the development of this care plan and knows that they can request review of or adjustments to the care plan at any time.      All of the patient's questions and concerns have been addressed.    Roderic Palau   [facility] Specialty Pharmacist

## 2022-04-01 MED FILL — DOVATO 50 MG-300 MG TABLET: ORAL | 30 days supply | Qty: 30 | Fill #10

## 2022-04-24 ENCOUNTER — Other Ambulatory Visit: Payer: Self-pay | Admitting: Cardiology

## 2022-04-24 DIAGNOSIS — I110 Hypertensive heart disease with heart failure: Secondary | ICD-10-CM

## 2022-04-29 NOTE — Unmapped (Signed)
[facility] Refill Coordination Note    Specialty Medication(s) to be Shipped:   Infectious Disease: [name]    Other medication(s) to be shipped: No additional medications requested for fill at this time     Benjamin Ortiz, DOB: 11-05-1980  Phone: [phone] (home)       All above HIPAA information was verified with patient.     Was a Nurse, learning disability used for this call? No    Completed refill call assessment today to schedule patient's medication shipment from the [facility] [phone]).  All relevant notes have been reviewed.     Specialty medication(s) and dose(s) confirmed: Regimen is correct and unchanged.   Changes to medications: Imothy reports no changes at this time.  Changes to insurance: No  New side effects reported not previously addressed with a pharmacist or physician: None reported  Questions for the pharmacist: No    Confirmed patient received a Conservation officer, historic buildings and a Surveyor, mining with first shipment. The patient will receive a drug information handout for each medication shipped and additional FDA Medication Guides as required.       DISEASE/MEDICATION-SPECIFIC INFORMATION        N/A    SPECIALTY MEDICATION ADHERENCE     Medication Adherence    Patient reported X missed doses in the last month: 0  Specialty Medication: dovato 50-300mg   Patient is on additional specialty medications: No  Patient is on more than two specialty medications: No  Any gaps in refill history greater than 2 weeks in the last 3 months: no  Demonstrates understanding of importance of adherence: yes  Informant: patient  Reliability of informant: reliable  Provider-estimated medication adherence level: good  Patient is at risk for Non-Adherence: No  Reasons for non-adherence: no problems identified                  Confirmed plan for next specialty medication refill: delivery by pharmacy  Refills needed for supportive medications: not needed          Refill Coordination Has the Patients' Contact Information Changed: No  Is the Shipping Address Different: No         Were doses missed due to medication being on hold? No    dovato 50-300 mg: 5 days of medicine on hand       REFERRAL TO PHARMACIST     Referral to the pharmacist: Not needed      [facility]     Shipping address confirmed in Epic.     Delivery Scheduled: Yes, Expected medication delivery date: 08/15.     Medication will be delivered via UPS to the prescription address in Epic WAM.    Benjamin Ortiz   [facility] Specialty Technician

## 2022-05-02 MED FILL — DOVATO 50 MG-300 MG TABLET: ORAL | 30 days supply | Qty: 30 | Fill #11

## 2022-05-10 DIAGNOSIS — B20 Human immunodeficiency virus [HIV] disease: Principal | ICD-10-CM

## 2022-05-12 ENCOUNTER — Other Ambulatory Visit: Payer: Self-pay | Admitting: Cardiology

## 2022-05-12 DIAGNOSIS — I110 Hypertensive heart disease with heart failure: Secondary | ICD-10-CM

## 2022-05-13 DIAGNOSIS — B20 Human immunodeficiency virus [HIV] disease: Principal | ICD-10-CM

## 2022-05-13 DIAGNOSIS — Z9189 Other specified personal risk factors, not elsewhere classified: Principal | ICD-10-CM

## 2022-05-13 DIAGNOSIS — Z13220 Encounter for screening for lipoid disorders: Principal | ICD-10-CM

## 2022-05-18 ENCOUNTER — Ambulatory Visit
Admit: 2022-05-18 | Discharge: 2022-05-19 | Payer: PRIVATE HEALTH INSURANCE | Attending: Student in an Organized Health Care Education/Training Program | Primary: Student in an Organized Health Care Education/Training Program

## 2022-05-18 DIAGNOSIS — I10 Essential (primary) hypertension: Principal | ICD-10-CM

## 2022-05-18 DIAGNOSIS — B20 Human immunodeficiency virus [HIV] disease: Principal | ICD-10-CM

## 2022-05-18 LAB — URINALYSIS WITH MICROSCOPY
BILIRUBIN UA: NEGATIVE
GLUCOSE UA: NEGATIVE
KETONES UA: NEGATIVE
LEUKOCYTE ESTERASE UA: NEGATIVE
NITRITE UA: NEGATIVE
PH UA: 8 (ref 5.0–9.0)
RBC UA: 4 /HPF — ABNORMAL HIGH (ref <3)
SPECIFIC GRAVITY UA: 1.02 (ref 1.005–1.030)
SQUAMOUS EPITHELIAL: <1 /HPF (ref 0–5)
UROBILINOGEN UA: 1
WBC UA: 1 /HPF (ref <2)

## 2022-05-18 NOTE — Unmapped (Addendum)
It was great to see you today.    Please reach out to your cardiologist (Heart doctor) to discuss changing your blood pressure medication and discuss possible modification to cholesterol medications.     Please try and cut back on sweet teas and sodas.     The ID clinic phone number is [phone].  The ID clinic fax number is [phone].    Please note that your laboratory and other results may be visible to you in real time, possibly before they reach your provider. Please allow 48 hours for clinical interpretation of these results. Importantly, even if a result is flagged as abnormal, it may not be one that impacts your health.    For urgent issues on nights and weekends you may reach the ID Physician on call through the [facility] Operator at [phone].     URGENT CARE  Please call ahead to speak with the nursing staff if you are in need of an urgent appointment.       MEDICATIONS  For refills please contact your pharmacy and ask them to electronically send or fax the request to the clinic.   Please bring all medications in original bottles to every appointment.    HMAP (formerly ADAP) or [employer] Eligibility (required even if you do not receive medication through [facility])  Please remember to renew your Juanell Fairly eligibility during renewal periods which occur twice a year: January-March and July-September.     The following are needed for each renewal:   - [facility] Identification (if you don't have one, then a bill with your name and address in [place])   - proof of income (award letter, W-2, or last three check stubs)   If you are unable to come in for renewal, let us know if we can mail, fax or e-mail paperwork to you.   HMAP Contact: [phone].     Terrall Laity, MD  [facility] Infectious Diseases Clinic at [facility]  [address]   [place], [place] [address]  Phone: [phone]   Fax: [phone]     Lab info:  Your most recent CD4 T-cell counts and viral loads are below. Here are a few things to keep in mind when looking at your numbers:  Our goal is to get your virus to be undetectable and keep it undetectable. If the virus is undetectable you are much more likely to stay healthy.  We consider your viral load to be undetectable if it says <40 or if it says Not detected.  For most people, we're checking CD4 counts every other visit (once or twice a year, or sometimes even less).  It's normal for your CD4 count to be different from visit to visit.   You can help by taking your medications at about the same time, every single day. If you're having trouble with taking your medications, it's important to let us know.    Lab Results   Component Value Date    ACD4 1,269 05/19/2021    CD4 47 05/19/2021    HIVCP <40 (H) 05/19/2021    HIVRS Detected (A) 05/19/2021

## 2022-05-18 NOTE — Unmapped (Signed)
Infectious Diseases Clinic        Assessment/Plan:     HIV   -  Overall had been doing well. Current regimen: [name] (3TC/DTG). Switched from [place] due to cost. Was on Atripla prior to that, following dx in 2006.     Misses doses of ARVs never     Med access through insurance  CD4 count over 300 for >2Y on suppressive ART; no prophylaxis needed; recheck CD4 annually  Discussed ARV adherence, injectable ARVs and switching to [name]    Lab Results   Component Value Date    ACD4 1,269 05/19/2021    CD4TABS 918 12/18/2019    CD4 47 05/19/2021    CD4TCELL 43.7 12/18/2019    HIVRS Detected (A) 05/19/2021    HIVCP <40 (H) 05/19/2021       ??? No labs today (labs drawn at [facility] earlier this week, have not yet returned)  ??? Continue current therapy  ??? Encouraged continued excellent ARV adherence    HTN  Managed by cards care provider at [facility] but hasn't been seen for a few months. Appears some meds were stopped in March 2023, so now only on bystolic and entresto (previously seems was also on amlodipine and chlorthalidone). BP poorly controlled at today's visit and pt reports multiple episodes at work that may be attributable to high BP (some spots in vision, blurry vision, and even some HA). Reports he can tell when his BP is high. When measures at home he is usually seeing 140s/80s but has on a few occasions seen 200s/100s. Given complexity of his prior BP management, I have suggested he reach back out to [facility] cards. Provided return precautions (pt is w/o symptoms at today's visit).   - PCP referral but realistically should f/u with his existing cards provider to discuss medication modification given home measurements.      NICM  nonischemic dilated cardiomyopathy, coronary angiography on 06/30/2014 which revealed normal coronary arteries with ejection fraction of 35-40%, though recent echo 07/2020 with EF 62%, mild pulm regurg. EF now stable over last few years. Denies CP associated with elevated BP and no exercise intolerance/SOB.    Severe OSA  Follow[facility]y [facility] Adherent to cpap.    Hyperlipidemia  Follow[facility] primary [facility]. Prior TG 04/2021 were elevated, non-fasting. Were >700 back in 2021. Did get fasting labs earlier this week that are still pending. Have encouraged f/u as above.  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 HRA 07/2019. Minimal acetowhitening in the left anterior region or the perianus. Internally looked good. No signs of lesions secondary to HPV internally.   - anal pap today    Mood/family challenges  His mother has progressive dementia and they are working with his father on a safe disposition plan for her that may include a locked memory unit. Pt understandably struggling with this transition and decline. Pt also seems more stressed with work - team has been Systems developer. Offered reassurance at today's visit.   - CTM    Nicotine dependence  He reports that he quit smoking about 10 years ago. He has never used smokeless tobacco.      Sexual health & secondary prevention  Sex with cis men.    Denies recent sex   He does routinely discuss status with partner(s). Sometimes uses condoms.      Lab Results  Component Value Date    RPR Reactive (A) 05/19/2021    RPR 1:4 05/19/2021    CTNAA Negative 05/19/2021    CTNAA Negative 05/19/2021    CTNAA Negative 05/19/2021    GCNAA Negative 05/19/2021    GCNAA Negative 05/19/2021    GCNAA Negative 05/19/2021    SPECSOURCE Rectum 05/19/2021    SPECSOURCE Throat 05/19/2021    SPECSOURCE Urine 05/19/2021       ??? GC/CT NAATs -- obtained today from all exposed anatomical site(s)  ??? RPR -- for screening obtained today    Health maintenance     Oral health  He does  have a dentist. Last dental exam was a few years ago at [facility]. Re-referred to [facility] 05/18/22    Eye health  He does not use corrective lenses. Last eye exam within last 12 months, through [facility]    Metabolic conditions  Wt Readings from Last 5 Encounters:   05/18/22 98.2 kg (216 lb 6.4 oz)   05/19/21 95.8 kg (211 lb 3.2 oz)   06/02/20 95.5 kg (210 lb 9.6 oz)   07/25/19 95.7 kg (210 lb 14.4 oz)   06/11/19 95.7 kg (210 lb 14.4 oz)     Lab Results   Component Value Date    CREATININE 0.84 05/19/2021    PROTEINUA Trace (A) 05/18/2022    PROTEINUR 6 01/22/2013    GLUCOSEU Negative 05/18/2022    PCRATIOUR 0.075 01/22/2013    GLU 90 05/19/2021    A1C 5.6 05/19/2021    ALT 29 05/19/2021    ALT 36 06/02/2020    ALT 38 12/18/2019     # Kidney health - UA today (Cr w pending labs from [facility])  # Bone health - assessment not yet needed (under age 73) - needs at next visit  # Diabetes assessment - defer mgm't to PCP  # NAFLD assessment - suspicion for NAFLD low    Communicable diseases  Lab Results   Component Value Date    HEPBSAB Reactive (A) 05/19/2021    HEPCAB Nonreactive 05/19/2021     # TB screening - assessment needed but deferred to future visit  # Hepatitis screening - no indication for screening  # MMR screening - not assessed    Cancer screening  Lab Results   Component Value Date    FINALDX  06/11/2019     A: Anus, brushing/scraping  Atypical squamous cells, cannot exclude high grade squamous intraepithelial lesion     This electronic signature is attestation that the pathologist personally reviewed the submitted material(s) and the final diagnosis reflects that evaluation.       # Cervical -  n/a  # Breast - no indication for screening at present    # Anorectal - anal Pap today  # Colorectal - screening not indicated  # Liver - no screening indicated  # Lung - screening not indicated  # Prostate - screening not indicated    Cardiovascular disease  Lab Results   Component Value Date    CHOL 153 05/19/2021    HDL <20 (L) 05/19/2021    LDL  05/19/2021      Comment:      NHLBI Recommended Ranges, LDL Cholesterol, for Adults (20+yrs) (ATPIII), mg/dL  Optimal              <161  Near Optimal        100-129  Borderline High     130-159  High  160-189  Very High            >=190  NHLBI Recommended Ranges, LDL Cholesterol, for Children (2-19 yrs), mg/dL  Desirable            <161  Borderline High     110-129  High                 >=130    Unable to calculate due to triglyceride greater than 400 mg/dL.  Unable to calculate due to triglyceride greater than 400 mg/dL.  Unable to calculate due to triglyceride greater than 400 mg/dL.    NONHDL  05/19/2021      Comment:      Non-HDL Cholesterol Recommended Ranges (mg/dL)  Optimal      <096  Near Optimal130 - 159  Borderline High160 - 189  High            190 - 219  Very High      >220      TRIG 542 (H) 05/19/2021     # The ASCVD Risk Score (Arnett DK, et al., 2019) failed to return a valid value for an unknown reason.  - is taking aspirin   - is taking statin  - BP control poor  - former smoker  # AAA screening - no indication for screening    Immunization History   Administered Date(s) Administered   ??? COVID-19 VACC,MRNA,(PFIZER)(PF) 12/07/2019, 12/28/2019, 08/08/2020   ??? Hepatitis A 04/24/2007, 04/08/2008   ??? Influenza Vaccine Quad (IIV4 PF) 98mo+ injectable 06/02/2020   ??? Influenza Virus Vaccine, unspecified formulation 06/29/2015, 05/27/2019   ??? PNEUMOCOCCAL POLYSACCHARIDE 23 04/12/2005, 02/22/2011   ??? PPD Test 04/08/2008, 06/30/2009, 10/06/2009   ??? Pneumococcal Conjugate 13-Valent 02/04/2014   ??? SMALLPOX,MONKEYPOX(PF)(JYNNEOS) 05/19/2021   ??? TD(TDVAX),ADSORBED,2LF(IM)(PF) 12/22/2015   ??? TdaP 03/14/2006       ??? Immunizations today - no immunizations needed    I personally spent 35 minutes face-to-face and non-face-to-face in the care of this patient, which includes all pre, intra, and post visit time on the date of service.  All documented time was specific to the E/M visit and does not include any procedures that may have been performed.    Disposition    Return to clinic 5-6 months or sooner if needed.    To do @ next RTC  ??? Confirm seen cards  ??? Establish with [facility] PCP?                Chief Complaint   Follow-up HIV      HPI  In addition to details in A&P above:    One sexual partner since last encounter, no recent sti testing. Doing fine on dovato. Main issue has been work stress and sx he attributes to both stress and elevated BP    HIV:  No missed doses   No hospitalizations      Past Medical History:   Diagnosis Date   ??? HIV disease (CMS-HCC)    ??? HTN (hypertension) 01/22/2013   ??? Pain, dental 07/23/2013         Social History  Housing - in apt in [place] with his brother    School / Work - Not in school. Employed Public relations account executive)    Tobacco - He reports that he quit smoking about 10 years ago. He has never used smokeless tobacco.   Alcohol - special occassions, 3-4 glasses of wine on weekends    Substance use - none  Review of Systems  As per HPI. All others negative.      Medications and Allergies  He has a current medication list which includes the following prescription(s): allopurinol, ascorbic acid (vitamin c), aspirin, aspirin-acetaminophen-caffeine, blood pressure test kit-large, dovato, ibuprofen, metoprolol succinate, rosuvastatin, entresto, trazodone, and bystolic.    Allergies: Enalapril      Family History  His family history includes Diabetes in his father; Heart disease in his father; Rheumatologic disease in his mother.                   BP 155/94 (BP Site: R Arm, BP Position: Sitting, BP Cuff Size: Medium)  - Pulse 61  - Temp 36.8 ??C (98.2 ??F) (Tympanic)  - Ht (!) 6 cm (2.36)  - Wt 98.2 kg (216 lb 6.4 oz)  - BMI 27270.95 kg/m??      Const looks well and attentive, alert, appropriate   Eyes sclerae anicteric, noninjected OU   ENT dentition good, no thrush, leukoplakia or oral lesions and tongue piercing   Lymph no cervical  or supraclavicular LAD   CV RRR. No murmurs. No rub or gallop. S1/S2. No LEE   Resp CTAB ant/post, normal work of breathing   GI deferred   GU deferred   Rectal normal by inspection   Skin no petechiae, ecchymoses or obvious rashes on clothed exam   MSK no joint tenderness and normal ROM throughout   Neuro CN II-XII grossly intact, MAEE, non focal   Psych Appropriate affect. Eye contact good. Linear thoughts. Fluent speech.

## 2022-05-20 LAB — LYMPHOCYTE MARKERS LIMITED
% CD 3 POS. LYMPH.: 69.5 % (ref 57.5–86.2)
% NK (CD56/16): 12.9 % (ref 1.4–19.4)
AB NK (CD56): 310 /uL (ref 24–406)
ABSOLUTE CD 3: 1668 /uL (ref 622–2402)
ABSOLUTE CD8 CNT: 725 /uL (ref 109–897)
BANDED NEUTROPHILS ABSOLUTE COUNT: 0 10*3/uL (ref 0.0–0.1)
BASOPHILS ABSOLUTE COUNT: 0.1 10*3/uL (ref 0.0–0.2)
BASOPHILS RELATIVE PERCENT: 1 %
CD4 % HELPER T CELL: 40 % (ref 30.8–58.5)
CD4 T CELL ABSOLUTE: 960 /uL (ref 359–1519)
CD4:CD8 RATIO: 1.32 (ref 0.92–3.72)
CD8 % SUPPRESSOR T CELL: 30.2 % (ref 12.0–35.5)
EOSINOPHILS ABSOLUTE COUNT: 0.1 10*3/uL (ref 0.0–0.4)
EOSINOPHILS RELATIVE PERCENT: 3 %
HEMATOCRIT: 47 % (ref 37.5–51.0)
HEMOGLOBIN: 16.6 g/dL (ref 13.0–17.7)
IMMATURE GRANULOCYTES: 0 %
LYMPHOCYTES ABSOLUTE COUNT: 2.4 10*3/uL (ref 0.7–3.1)
LYMPHOCYTES RELATIVE PERCENT: 55 %
MEAN CORPUSCULAR HEMOGLOBIN CONC: 35.3 g/dL (ref 31.5–35.7)
MEAN CORPUSCULAR HEMOGLOBIN: 34.4 pg — ABNORMAL HIGH (ref 26.6–33.0)
MEAN CORPUSCULAR VOLUME: 97 fL (ref 79–97)
MONOCYTES ABSOLUTE COUNT: 0.3 10*3/uL (ref 0.1–0.9)
MONOCYTES RELATIVE PERCENT: 8 %
NEUTROPHILS ABSOLUTE COUNT: 1.4 10*3/uL (ref 1.4–7.0)
NEUTROPHILS RELATIVE PERCENT: 33 %
PLATELET COUNT: 130 10*3/uL — ABNORMAL LOW (ref 150–450)
RED BLOOD CELL COUNT: 4.83 x10E6/uL (ref 4.14–5.80)
RED CELL DISTRIBUTION WIDTH: 12.2 % (ref 11.6–15.4)
WHITE BLOOD CELL COUNT: 4.3 10*3/uL (ref 3.4–10.8)

## 2022-05-20 LAB — BASIC METABOLIC PANEL
BLOOD UREA NITROGEN: 6 mg/dL (ref 6–24)
BUN / CREAT RATIO: 6 — ABNORMAL LOW (ref 9–20)
CALCIUM: 9.2 mg/dL (ref 8.7–10.2)
CHLORIDE: 103 mmol/L (ref 96–106)
CO2: 20 mmol/L (ref 20–29)
CREATININE: 0.93 mg/dL (ref 0.76–1.27)
EGFR: 106 mL/min/{1.73_m2}
GLUCOSE: 141 mg/dL — ABNORMAL HIGH (ref 70–99)
POTASSIUM: 3.9 mmol/L (ref 3.5–5.2)
SODIUM: 138 mmol/L (ref 134–144)

## 2022-05-20 LAB — BILIRUBIN, TOTAL: BILIRUBIN TOTAL (MG/DL) IN SER/PLAS: 0.2 mg/dL (ref 0.0–1.2)

## 2022-05-20 LAB — LIPID PANEL
CHOLESTEROL, TOTAL: 184 mg/dL (ref 100–199)
HDL CHOLESTEROL: 28 mg/dL — ABNORMAL LOW
LDL CHOLESTEROL CALCULATED: 53 mg/dL (ref 0–99)
LDL/HDL RATIO: 1.9 ratio (ref 0.0–3.6)
TRIGLYCERIDES: 700 mg/dL (ref 0–149)
VLDL CHOLESTEROL CAL: 103 mg/dL — ABNORMAL HIGH (ref 5–40)

## 2022-05-20 LAB — ALT: ALT (SGPT): 37 IU/L (ref 0–44)

## 2022-05-20 LAB — AST: AST (SGOT): 24 IU/L (ref 0–40)

## 2022-05-20 LAB — HIV RNA, QUANTITATIVE, PCR: HIV RNA: <40 {copies}/mL

## 2022-05-27 DIAGNOSIS — B20 Human immunodeficiency virus [HIV] disease: Principal | ICD-10-CM

## 2022-05-27 MED ORDER — DOVATO 50 MG-300 MG TABLET
ORAL_TABLET | Freq: Every day | ORAL | 11 refills | 30 days | Status: CP
Start: 2022-05-27 — End: ?
  Filled 2022-05-30: qty 30, 30d supply, fill #0

## 2022-05-27 NOTE — Unmapped (Signed)
[facility] Specialty Pharmacy Refill Coordination Note    Specialty Medication(s) to be Shipped:   Infectious Disease: [name]    Other medication(s) to be shipped: No additional medications requested for fill at this time     Amol Meah, DOB: September 17, 1981  Phone: [phone] (home)       All above HIPAA information was verified with patient.     Was a Nurse, learning disability used for this call? No    Completed refill call assessment today to schedule patient's medication shipment from the [facility] [phone]).  All relevant notes have been reviewed.     Specialty medication(s) and dose(s) confirmed: Regimen is correct and unchanged.   Changes to medications: Caidan reports no changes at this time.  Changes to insurance: No  New side effects reported not previously addressed with a pharmacist or physician: None reported  Questions for the pharmacist: No    Confirmed patient received a Conservation officer, historic buildings and a Surveyor, mining with first shipment. The patient will receive a drug information handout for each medication shipped and additional FDA Medication Guides as required.       DISEASE/MEDICATION-SPECIFIC INFORMATION        N/A    SPECIALTY MEDICATION ADHERENCE     Medication Adherence    Patient reported X missed doses in the last month: 0  Specialty Medication: dovato 50-300mg                           Were doses missed due to medication being on hold? No    dovato 50-300mg   : 5 days of medicine on hand       REFERRAL TO PHARMACIST     Referral to the pharmacist: Not needed      [facility]     Shipping address confirmed in Epic.     Delivery Scheduled: Yes, Expected medication delivery date: 9/12.     Medication will be delivered via UPS to the prescription address in Epic WAM.    Westley Gambles   [facility] Specialty Technician

## 2022-05-27 NOTE — Unmapped (Signed)
Medication Requested:  Current regimen: [name] (3TC/DTG).      Future Appointments   Date Time Provider Department Center   07/21/2022  3:20 PM Artelia Laroche, MD [facility]   10/19/2022  3:30 PM Leveda Anna, MD [facility]     Per Provider Note:  Current regimen: [name] (3TC/DTG).    Standing order protocol requirements met?: Yes    Sent to: Pharmacy per protocol    Days Supply Given: 30 days  Number of Refills: 4

## 2022-06-01 ENCOUNTER — Ambulatory Visit: Payer: BC Managed Care – PPO | Admitting: Cardiology

## 2022-06-01 ENCOUNTER — Encounter: Payer: Self-pay | Admitting: Cardiology

## 2022-06-01 VITALS — BP 154/110 | HR 78 | Temp 97.6°F | Resp 16 | Ht 71.0 in | Wt 210.0 lb

## 2022-06-01 DIAGNOSIS — I428 Other cardiomyopathies: Secondary | ICD-10-CM

## 2022-06-01 DIAGNOSIS — I10 Essential (primary) hypertension: Secondary | ICD-10-CM

## 2022-06-01 DIAGNOSIS — E781 Pure hyperglyceridemia: Secondary | ICD-10-CM

## 2022-06-01 DIAGNOSIS — I110 Hypertensive heart disease with heart failure: Secondary | ICD-10-CM

## 2022-06-01 MED ORDER — AMLODIPINE BESYLATE 10 MG PO TABS: 10.0000 mg | ORAL_TABLET | Freq: Every day | ORAL | 2 refills | Status: DC

## 2022-06-01 MED ORDER — NEBIVOLOL HCL 20 MG PO TABS: 1.0000 | ORAL_TABLET | Freq: Every day | ORAL | 3 refills | Status: AC

## 2022-06-01 MED ORDER — OMEGA-3-ACID ETHYL ESTERS 1 G PO CAPS: 2.0000 g | ORAL_CAPSULE | Freq: Two times a day (BID) | ORAL | 3 refills | Status: DC

## 2022-06-01 NOTE — Patient Instructions (Signed)
Blood work in one month.

## 2022-06-01 NOTE — Progress Notes (Signed)
Primary Physician/Referring:  Georgianne Fick, MD  Patient ID: Justin Cox, male    DOB: 1981-08-07, 41 y.o.   MRN: [id number]  No chief complaint on file.  HPI:    Justin Cox  is a 41 y.o.AAM with  with nonischemic dilated cardiomyopathy, coronary angiography on 06/30/2014 which revealed normal coronary arteries with ejection fraction of 35-40%, HIV infection, mixed hyperlipidemia, OSA on CPAP and resistant hypertension. Has had negative renal artery duplex in July 2018.  His last echocardiogram on 06/21/2019 was around 50 to 55%.  He was lost to follow-up for the past 2 years, made an appointment to see me for refill of blood pressure medications.  Fortunately remains asymptomatic.  Past Medical History:  Diagnosis Date   Asthma    CHF (congestive heart failure) (HCC)    Headache    High cholesterol    HIV infection (HCC)    Hypertension    TIA (transient ischemic attack)    Umbilical hernia    Past Surgical History:  Procedure Laterality Date   INSERTION OF MESH N/A 08/30/2018   Procedure: INSERTION OF MESH;  Surgeon: Manus Rudd, MD;  Location: [facility] OR;  Service: General;  Laterality: N/A;   LEFT HEART CATHETERIZATION WITH CORONARY ANGIOGRAM N/A 06/30/2014   Procedure: LEFT HEART CATHETERIZATION WITH CORONARY ANGIOGRAM;  Surgeon: [name], MD;  Location: [facility] CATH LAB;  Service: Cardiovascular;  Laterality: N/A;   UMBILICAL HERNIA REPAIR N/A 08/30/2018   Procedure: HERNIA REPAIR UMBILICAL ADULT ERAS PATHWAY;  Surgeon: Manus Rudd, MD;  Location: [facility] OR;  Service: General;  Laterality: N/A;   Social History   Tobacco Use   Smoking status: Former    Years: 6.00    Types: Cigarettes    Quit date: 10/20/2010    Years since quitting: 11.6   Smokeless tobacco: Never   Tobacco comments:    smoked about 2 packs per week (when used to smoke)  Substance Use Topics   Alcohol use: Yes    Alcohol/week: 3.0 standard drinks of alcohol    Types: 3 Standard drinks or  equivalent per week    Comment: Occasional    Family History  Problem Relation Age of Onset   Hypertension Mother    Diabetes Father    Hyperlipidemia Father    Hypertension Father    Hypertension Other    Hyperlipidemia Other    Diabetes Other    Hypertension Brother     Marital Status: Single ROS  Review of Systems  Cardiovascular:  Negative for chest pain, dyspnea on exertion and leg swelling.   Objective      06/01/2022    1:12 PM 06/01/2022    1:10 PM 08/04/2021   11:18 AM  Vitals with BMI  Height  5\' 11"  5\' 11"   Weight  210 lbs 217 lbs 8 oz  BMI  29.3 30.35  Systolic 154 165  Diastolic 110 107 94  Pulse 78 91 75    Blood pressure (!) 154/110, pulse 78, temperature 97.6 F (36.4 C), temperature source Temporal, resp. rate 16, height 5\' 11"  (1.803 m), weight 210 lb (95.3 kg), SpO2 96 %. Body mass index is 29.29 kg/m.   Physical Exam Cardiovascular:     Rate and Rhythm: Normal rate and regular rhythm.     Pulses: Intact distal pulses.     Heart sounds: Normal heart sounds. No murmur heard.    No gallop.     Comments: No leg edema, no JVD. Pulmonary:  Effort: Pulmonary effort is normal.     Breath sounds: Normal breath sounds.  Abdominal:     General: Bowel sounds are normal.     Palpations: Abdomen is soft.    Radiology: No results found.  Laboratory examination:  External labs  Labs 05/16/2022:  Serum glucose 141, BUN 6, creatinine 0.93, potassium 3.9.  ALT, AST normal.  Total cholesterol 184, triglycerides 79, HDL 28, LDL 53.  Hb 16.6/HCT 47.0, platelets 130.  Medications and allergies   Allergies  Allergen Reactions   Enalapril Cough    Current Outpatient Medications:    allopurinol (ZYLOPRIM) 100 MG tablet, Take 100 mg by mouth daily., Disp: , Rfl:    amLODipine (NORVASC) 10 MG tablet, Take 1 tablet (10 mg total) by mouth daily., Disp: 30 tablet, Rfl: 2   Ascorbic Acid (VITAMIN C) 1000 MG tablet, Take 1,000 mg by mouth daily.,  Disp: , Rfl:    dolutegravir-lamiVUDine ([name]) 50-300 MG tablet, Take 1 tablet by mouth daily., Disp: , Rfl:    ENTRESTO 97-103 MG, Take 1 tablet by mouth twice daily, Disp: 180 tablet, Rfl: 0   omega-3 acid ethyl esters (LOVAZA) 1 g capsule, Take 2 capsules (2 g total) by mouth 2 (two) times daily., Disp: 120 capsule, Rfl: 3   rosuvastatin (CRESTOR) 10 MG tablet, TAKE 1 TABLET BY MOUTH ONCE DAILY IN THE MORNING (NEEDS  APPOINTMENT  FOR  FURTHER  REFILLS), Disp: 90 tablet, Rfl: 0   traZODone (DESYREL) 50 MG tablet, Take 0.5 tablets (25 mg total) by mouth at bedtime as needed for sleep., Disp: 90 tablet, Rfl: 1   Nebivolol HCl 20 MG TABS, Take 1 tablet (20 mg total) by mouth daily., Disp: 90 tablet, Rfl: 3   Radiology   No results found.  Cardiac Studies:   Nuclear Stress test (Outside) 05/16/2014: Impression: Exercise Capacity:  Lexiscan with no exercise. BP Response:  Normal blood pressure response. Clinical Symptoms:  Mild shortness of breath. ECG Impression:  No significant ST segment change suggestive of ischemia. Comparison with Prior Nuclear Study: No previous nuclear study performed. Overall Impression:  Low risk stress nuclear study demonstrating a small region of qualitative without statistically significant mild basal inferior ischemia.  Coronary angiogram 06/30/2014: Normal coronary arteries, LVEF 35 to 40% with global hypokinesis and basal inferior hypokinesis.  EDP 19 mmHg.  Renal artery duplex 03/30/2017: Renal arteries were not well visualized due to significant gas shadowing.  Right kidney appears to be small measuring 7.39 x 3.93 x 3.9 cm and left kidney also appears small measuring 8.3 x 4.36 x 4.3 cm. Normal intrarenal vascular perfusion is noted in both kidneys.  Echocardiogram 06/21/2019:  Normal left ventricular wall thickness. Left ventricle cavity is normal in  size. Normal LV systolic function with visual EF 50-55%. Normal global  wall motion. Doppler evidence of  grade I (impaired) diastolic dysfunction,  normal LAP.  Mild tricuspid regurgitation. No evidence of pulmonary hypertension.  Compared to previous study on 07/23/2018, LVEF is improved from 30-35% to 50-55%.  Extended Holter Monitor 15 day 12/05/2019: Predominant rhythm is normal sinus rhythm.  Minimum heart rate 64 bpm, maximum heart rate 116 bpm. Ventricular ectopy = 57.  0 ventricular pairs and 0 ventricular runs. Rare PACs.  No atrial fibrillation.  Patient activated events (2) reveal PVC.  EKG: EKG 06/01/2022: Normal sinus rhythm at the rate of 74 bpm, LVH.  Nonspecific T abnormalit.  No significant change from 08/03/2020.    Assessment     ICD-10-CM  1. Nonischemic cardiomyopathy (HCC)  I42.8 EKG 12-Lead    2. Hypertensive heart disease with chronic diastolic congestive heart failure (HCC)  I11.0 Nebivolol HCl 20 MG TABS   I50.32 amLODipine (NORVASC) 10 MG tablet    PCV ECHOCARDIOGRAM COMPLETE    3. Primary hypertension  I10     4. Primary hypertriglyceridemia  E78.1 omega-3 acid ethyl esters (LOVAZA) 1 g capsule    Lipid Panel With LDL/HDL Ratio    LDL cholesterol, direct      Meds ordered this encounter  Medications   Nebivolol HCl 20 MG TABS    Sig: Take 1 tablet (20 mg total) by mouth daily.    Dispense:  90 tablet    Refill:  3   amLODipine (NORVASC) 10 MG tablet    Sig: Take 1 tablet (10 mg total) by mouth daily.    Dispense:  30 tablet    Refill:  2   omega-3 acid ethyl esters (LOVAZA) 1 g capsule    Sig: Take 2 capsules (2 g total) by mouth 2 (two) times daily.    Dispense:  120 capsule    Refill:  3   Medications Discontinued During This Encounter  Medication Reason   amLODipine (NORVASC) 10 MG tablet    chlorthalidone (HYGROTON) 25 MG tablet    aspirin EC 325 MG EC tablet Discontinued by provider   Nebivolol HCl 20 MG TABS Reorder    Recommendations:   Justin Cox  is a 41 y.o. AAM with  with nonischemic dilated cardiomyopathy, coronary angiography  on 06/30/2014 which revealed normal coronary arteries with ejection fraction of 35-40%, HIV infection, hyperlipidemia, mixed, hypertension, and resistant hypertension. Has had negative renal artery duplex in July 2018.  His last echocardiogram on 06/21/2019 was around 50 to 55%.  He was lost to follow-up for the past 2 years, made an appointment to see me for refill of blood pressure medications.  Today fortunately no clinical evidence of heart failure.  I refilled the prescription for Bystolic.  He will also need amlodipine 5 mg daily, continue Entresto, high-dose.  He is presently being managed [facility] for HIV.  Recent labs reviewed.  I reviewed his external labs, triglycerides are markedly elevated.  He probably has elevated triglycerides both primary and also due to HIV medication probably.  Extreme high risk for pancreatitis and also CV events, I will start him on Lovaza 2 g twice daily.  He will need lipid profile testing in 4 weeks.  We will repeat echocardiogram.  I had a long discussion with the patient regarding lifestyle changes, poor eating habits.    He has been compliant with his CPAP.  External labs reviewed.  40-minute office visit encounter with managing his complex medical issues, coordination of care, reconciliation of external problems.  Discontinued his aspirin as there is no indication.  I will see him back in 4 weeks for follow-up.      Yates Decamp, MD, [facility] 06/01/2022, 1:50 PM Office: [phone] Pager: [phone]

## 2022-06-23 ENCOUNTER — Other Ambulatory Visit: Payer: Self-pay | Admitting: Cardiology

## 2022-06-24 NOTE — Unmapped (Signed)
[facility] Refill Coordination Note    Specialty Medication(s) to be Shipped:   Infectious Disease: [name]    Other medication(s) to be shipped: No additional medications requested for fill at this time     Benjamin Ortiz, DOB: 10-08-1980  Phone: [phone] (home)       All above HIPAA information was verified with patient.     Was a Nurse, learning disability used for this call? No    Completed refill call assessment today to schedule patient's medication shipment from the [facility] [phone]).  All relevant notes have been reviewed.     Specialty medication(s) and dose(s) confirmed: Regimen is correct and unchanged.   Changes to medications: Jadarious reports no changes at this time.  Changes to insurance: No  New side effects reported not previously addressed with a pharmacist or physician: None reported  Questions for the pharmacist: No    Confirmed patient received a Conservation officer, historic buildings and a Surveyor, mining with first shipment. The patient will receive a drug information handout for each medication shipped and additional FDA Medication Guides as required.       DISEASE/MEDICATION-SPECIFIC INFORMATION        N/A    SPECIALTY MEDICATION ADHERENCE     Medication Adherence    Patient reported X missed doses in the last month: 0  Specialty Medication: [name] 50-300 mg  Patient is on additional specialty medications: No                                Were doses missed due to medication being on hold? No    [name] 50-300 mg: 5 days of medicine on hand        REFERRAL TO PHARMACIST     Referral to the pharmacist: Not needed      [facility]     Shipping address confirmed in Epic.     Delivery Scheduled: Yes, Expected medication delivery date: 06/28/22.     Medication will be delivered via UPS to the prescription address in Epic WAM.    [name]   [facility] Specialty Technician

## 2022-06-27 MED FILL — DOVATO 50 MG-300 MG TABLET: ORAL | 30 days supply | Qty: 30 | Fill #1

## 2022-06-30 ENCOUNTER — Ambulatory Visit: Payer: BC Managed Care – PPO

## 2022-06-30 DIAGNOSIS — I110 Hypertensive heart disease with heart failure: Secondary | ICD-10-CM

## 2022-07-01 LAB — LIPID PANEL WITH LDL/HDL RATIO
Cholesterol, Total: 150 mg/dL (ref 100–199)
HDL: 36 mg/dL — ABNORMAL LOW (ref >39)
LDL Chol Calc (NIH): 92 mg/dL (ref 0–99)
LDL/HDL Ratio: 2.6 ratio (ref 0.0–3.6)
Triglycerides: 122 mg/dL (ref 0–149)
VLDL Cholesterol Cal: 22 mg/dL (ref 5–40)

## 2022-07-01 LAB — LDL CHOLESTEROL, DIRECT: LDL Direct: 96 mg/dL (ref 0–99)

## 2022-07-13 ENCOUNTER — Encounter: Payer: Self-pay | Admitting: Cardiology

## 2022-07-13 ENCOUNTER — Ambulatory Visit: Payer: BC Managed Care – PPO | Admitting: Cardiology

## 2022-07-13 VITALS — BP 136/80 | HR 75 | Temp 98.5°F | Resp 16 | Ht 71.0 in | Wt 215.0 lb

## 2022-07-13 DIAGNOSIS — E782 Mixed hyperlipidemia: Secondary | ICD-10-CM

## 2022-07-13 DIAGNOSIS — I428 Other cardiomyopathies: Secondary | ICD-10-CM

## 2022-07-13 DIAGNOSIS — I110 Hypertensive heart disease with heart failure: Secondary | ICD-10-CM

## 2022-07-13 MED ORDER — OMEGA-3-ACID ETHYL ESTERS 1 G PO CAPS: 2.0000 g | ORAL_CAPSULE | Freq: Two times a day (BID) | ORAL | 3 refills | Status: DC

## 2022-07-13 MED ORDER — ROSUVASTATIN CALCIUM 10 MG PO TABS: 10.0000 mg | ORAL_TABLET | Freq: Every day | ORAL | 3 refills | Status: AC

## 2022-07-13 MED ORDER — ENTRESTO 97-103 MG PO TABS: 1.0000 | ORAL_TABLET | Freq: Two times a day (BID) | ORAL | 3 refills | Status: AC

## 2022-07-13 MED ORDER — AMLODIPINE BESYLATE 10 MG PO TABS: 10.0000 mg | ORAL_TABLET | Freq: Every day | ORAL | 3 refills | Status: AC

## 2022-07-13 NOTE — Progress Notes (Signed)
Primary Physician/Referring:  Merrilee Seashore, MD  Patient ID: Justin Cox, male    DOB: 11/02/1980, 41 y.o.   MRN: [id number]  Chief Complaint  Patient presents with   Hypertension   Hyperlipidemia   Follow-up    6 weeks    HPI:    Justin Cox  is a 41 y.o.AAM with  with nonischemic dilated cardiomyopathy, coronary angiography on 06/30/2014 which revealed normal coronary arteries with ejection fraction of 35-40%, HIV infection, mixed hyperlipidemia, hypertension, and resistant hypertension. Has had negative renal artery duplex in July 2018.    He was lost to follow-up for the past 2 years, made an appointment to see me for refill of blood pressure medications and presented on 06/01/2022.    I reinitiated his medications, I discussed regarding lifestyle modification again and he now presents for his 6-week visit. Remains asymptomatic.  Past Medical History:  Diagnosis Date   Asthma    CHF (congestive heart failure) (HCC)    Headache    High cholesterol    HIV infection ([place])    Hypertension    TIA (transient ischemic attack)    Umbilical hernia    Past Surgical History:  Procedure Laterality Date   INSERTION OF MESH N/A 08/30/2018   Procedure: INSERTION OF MESH;  Surgeon: Donnie Mesa, MD;  Location: [place];  Service: General;  Laterality: N/A;   LEFT HEART CATHETERIZATION WITH CORONARY ANGIOGRAM N/A 06/30/2014   Procedure: LEFT HEART CATHETERIZATION WITH CORONARY ANGIOGRAM;  Surgeon: Pixie Casino, MD;  Location: [facility] CATH LAB;  Service: Cardiovascular;  Laterality: N/A;   UMBILICAL HERNIA REPAIR N/A 08/30/2018   Procedure: HERNIA REPAIR UMBILICAL ADULT ERAS PATHWAY;  Surgeon: Donnie Mesa, MD;  Location: [place];  Service: General;  Laterality: N/A;   Social History   Tobacco Use   Smoking status: Former    Years: 6.00    Types: Cigarettes    Quit date: 10/20/2010    Years since quitting: 11.7   Smokeless tobacco: Never   Tobacco comments:    smoked about 2  packs per week (when used to smoke)  Substance Use Topics   Alcohol use: Yes    Alcohol/week: 3.0 standard drinks of alcohol    Types: 3 Standard drinks or equivalent per week    Comment: Occasional    Family History  Problem Relation Age of Onset   Hypertension Mother    Diabetes Father    Hyperlipidemia Father    Hypertension Father    Hypertension Other    Hyperlipidemia Other    Diabetes Other    Hypertension Brother     Marital Status: Single ROS  Review of Systems  Cardiovascular:  Negative for chest pain, dyspnea on exertion and leg swelling.   Objective      07/13/2022    4:53 PM 07/13/2022    3:52 PM 06/01/2022    1:12 PM  Vitals with BMI  Height  5\' 11"    Weight  215 lbs   BMI  30   Systolic 086 578 469  Diastolic 80 77 629  Pulse  75 78    Blood pressure 136/80, pulse 75, temperature 98.5 F (36.9 C), temperature source Temporal, resp. rate 16, height 5\' 11"  (1.803 m), weight 215 lb (97.5 kg), SpO2 96 %. Body mass index is 29.99 kg/m.   Physical Exam Neck:     Vascular: No carotid bruit or JVD.  Cardiovascular:     Rate and Rhythm: Normal rate and regular rhythm.  Pulses: Intact distal pulses.     Heart sounds: Normal heart sounds. No murmur heard.    No gallop.  Pulmonary:     Effort: Pulmonary effort is normal.     Breath sounds: Normal breath sounds.  Abdominal:     General: Bowel sounds are normal.     Palpations: Abdomen is soft.  Musculoskeletal:     Right lower leg: No edema.     Left lower leg: No edema.    Radiology: No results found.  Laboratory examination:   Lab Results  Component Value Date   CHOL 150 06/30/2022   HDL 36 (L) 06/30/2022   LDLCALC 92 06/30/2022   LDLDIRECT 96 06/30/2022   TRIG 122 06/30/2022   CHOLHDL 5.2 04/11/2014    External labs  Labs 05/16/2022:  Serum glucose 141, BUN 6, creatinine 0.93, potassium 3.9.  ALT, AST normal.  Total cholesterol 184, triglycerides 700, HDL 28, LDL 53.  Hb  16.6/HCT 47.0, platelets 130.  Medications and allergies   Allergies  Allergen Reactions   Enalapril Cough    Current Outpatient Medications:    allopurinol (ZYLOPRIM) 100 MG tablet, Take 100 mg by mouth daily., Disp: , Rfl:    Ascorbic Acid (VITAMIN C) 1000 MG tablet, Take 1,000 mg by mouth daily., Disp: , Rfl:    dolutegravir-lamiVUDine ([name]) 50-300 MG tablet, Take 1 tablet by mouth daily., Disp: , Rfl:    Nebivolol HCl 20 MG TABS, Take 1 tablet (20 mg total) by mouth daily., Disp: 90 tablet, Rfl: 3   traZODone (DESYREL) 50 MG tablet, Take 0.5 tablets (25 mg total) by mouth at bedtime as needed for sleep., Disp: 90 tablet, Rfl: 1   amLODipine (NORVASC) 10 MG tablet, Take 1 tablet (10 mg total) by mouth daily., Disp: 90 tablet, Rfl: 3   ENTRESTO 97-103 MG, Take 1 tablet by mouth 2 (two) times daily., Disp: 180 tablet, Rfl: 3   omega-3 acid ethyl esters (LOVAZA) 1 g capsule, Take 2 capsules (2 g total) by mouth 2 (two) times daily., Disp: 370 capsule, Rfl: 3   rosuvastatin (CRESTOR) 10 MG tablet, Take 1 tablet (10 mg total) by mouth daily., Disp: 90 tablet, Rfl: 3   Radiology   No results found.  Cardiac Studies:   Nuclear Stress test (Outside) 05/16/2014: Impression: Exercise Capacity:  Lexiscan with no exercise. BP Response:  Normal blood pressure response. Clinical Symptoms:  Mild shortness of breath. ECG Impression:  No significant ST segment change suggestive of ischemia. Comparison with Prior Nuclear Study: No previous nuclear study performed. Overall Impression:  Low risk stress nuclear study demonstrating a small region of qualitative without statistically significant mild basal inferior ischemia.  Coronary angiogram 06/30/2014: Normal coronary arteries, LVEF 35 to 40% with global hypokinesis and basal inferior hypokinesis.  EDP 19 mmHg.  Renal artery duplex 03/30/2017: Renal arteries were not well visualized due to significant gas shadowing.  Right kidney appears to be  small measuring 7.39 x 3.93 x 3.9 cm and left kidney also appears small measuring 8.3 x 4.36 x 4.3 cm. Normal intrarenal vascular perfusion is noted in both kidneys.  Extended Holter Monitor 15 day 12/05/2019: Predominant rhythm is normal sinus rhythm.  Minimum heart rate 64 bpm, maximum heart rate 116 bpm. Ventricular ectopy = 57.  0 ventricular pairs and 0 ventricular runs. Rare PACs.  No atrial fibrillation.  Patient activated events (2) reveal PVC.  PCV ECHOCARDIOGRAM COMPLETE 06/30/2022  Left ventricle cavity is normal in size. Mild concentric hypertrophy of  the left ventricle. Normal global wall motion. Normal LV systolic function with EF 55%. Doppler evidence of grade I (impaired) diastolic dysfunction, normal LAP. No significant valvular abnormality. No evidence of pulmonary hypertension. Compared to previous study in 2021, mild LVH is new.  EKG: EKG 06/01/2022: Normal sinus rhythm at the rate of 74 bpm, LVH.  Nonspecific T abnormalit.  No significant change from 08/03/2020.    Assessment     ICD-10-CM   1. Hypertensive heart disease with chronic diastolic congestive heart failure (HCC)  I11.0 amLODipine (NORVASC) 10 MG tablet   I50.32     2. Mixed hypercholesterolemia and hypertriglyceridemia  E78.2 omega-3 acid ethyl esters (LOVAZA) 1 g capsule    DISCONTINUED: omega-3 acid ethyl esters (LOVAZA) 1 g capsule    3. Nonischemic cardiomyopathy (HCC)  I42.8 ENTRESTO 97-103 MG      Meds ordered this encounter  Medications   amLODipine (NORVASC) 10 MG tablet    Sig: Take 1 tablet (10 mg total) by mouth daily.    Dispense:  90 tablet    Refill:  3   ENTRESTO 97-103 MG    Sig: Take 1 tablet by mouth 2 (two) times daily.    Dispense:  180 tablet    Refill:  3   DISCONTD: omega-3 acid ethyl esters (LOVAZA) 1 g capsule    Sig: Take 2 capsules (2 g total) by mouth 2 (two) times daily.    Dispense:  120 capsule    Refill:  3   rosuvastatin (CRESTOR) 10 MG tablet    Sig: Take 1  tablet (10 mg total) by mouth daily.    Dispense:  90 tablet    Refill:  3   omega-3 acid ethyl esters (LOVAZA) 1 g capsule    Sig: Take 2 capsules (2 g total) by mouth 2 (two) times daily.    Dispense:  370 capsule    Refill:  3    Please disregard 30 day Rx   Medications Discontinued During This Encounter  Medication Reason   ENTRESTO 97-103 MG Reorder   amLODipine (NORVASC) 10 MG tablet Reorder   omega-3 acid ethyl esters (LOVAZA) 1 g capsule Reorder   rosuvastatin (CRESTOR) 10 MG tablet Reorder   omega-3 acid ethyl esters (LOVAZA) 1 g capsule     Recommendations:   Jamey Naas  is a 41 y.o. AAM with  with nonischemic dilated cardiomyopathy, coronary angiography on 06/30/2014 which revealed normal coronary arteries with ejection fraction of 35-40%, HIV infection, mixed hyperlipidemia, hypertension, and resistant hypertension. Has had negative renal artery duplex in July 2018.    He was lost to follow-up for the past 2 years, made an appointment to see me for refill of blood pressure medications and presented on 06/01/2022.    I reinitiated his medications, I discussed regarding lifestyle modification again and he now presents for his 6-week visit.  He is presently doing well and feels well.  Reviewed his echocardiogram, fortunately LVEF has remained normal and there is mild LVH that is new.  Blood pressure is also under excellent control.  I reviewed his triglycerides, with addition of Vascepa, excellent improvement in triglycerides from 800+ to normal range.  All the medications were refilled for 75-month supply for a year, I would like to see him back in 6 months for close monitoring.  Lifestyle modification, continued dietary changes and watchful of fatty food and excess salt discussed with the patient.      Adrian Prows, MD, [facility] 07/13/2022, 5:06 PM  Office: [phone] Pager: [phone]

## 2022-08-25 ENCOUNTER — Ambulatory Visit: Admit: 2022-08-25 | Discharge: 2022-08-26 | Payer: PRIVATE HEALTH INSURANCE

## 2022-08-25 DIAGNOSIS — G4733 Obstructive sleep apnea (adult) (pediatric): Principal | ICD-10-CM

## 2022-08-25 DIAGNOSIS — G8929 Other chronic pain: Principal | ICD-10-CM

## 2022-08-25 DIAGNOSIS — I10 Essential (primary) hypertension: Principal | ICD-10-CM

## 2022-08-25 DIAGNOSIS — B20 Human immunodeficiency virus [HIV] disease: Principal | ICD-10-CM

## 2022-08-25 DIAGNOSIS — M1A09X0 Idiopathic chronic gout, multiple sites, without tophus (tophi): Principal | ICD-10-CM

## 2022-08-25 DIAGNOSIS — M25562 Pain in left knee: Principal | ICD-10-CM

## 2022-08-25 DIAGNOSIS — K6282 Dysplasia of anus: Principal | ICD-10-CM

## 2022-08-25 DIAGNOSIS — I429 Cardiomyopathy, unspecified: Principal | ICD-10-CM

## 2022-08-25 DIAGNOSIS — E782 Mixed hyperlipidemia: Principal | ICD-10-CM

## 2022-08-25 DIAGNOSIS — R739 Hyperglycemia, unspecified: Principal | ICD-10-CM

## 2022-08-25 LAB — URIC ACID: URIC ACID: 6.7 mg/dL

## 2022-08-25 LAB — HEMOGLOBIN A1C
ESTIMATED AVERAGE GLUCOSE: 128 mg/dL
HEMOGLOBIN A1C: 6.1 % — ABNORMAL HIGH (ref 4.8–5.6)

## 2022-08-25 NOTE — Unmapped (Signed)
[facility] at [facility] Booster Left deltoid  Type of visit: face to face    Are you located in [place]? (for virtual visits only) N/A    Reason for visit: Establish Care    Questions / Concerns that need to be addressed:     Screening BP     [employer] BPs (complete if screening BP has a systolic  > 130 or diastolic > 80)  BP#1 133/77 P 64   BP#2 132/73 P 66  BP#3 128/73 P 66    Average BP 131/74 P 65  (please note this as a comment in vitals)     PTHomeBP         HCDM reviewed and updated in Epic:    We are working to make sure all of our patients??? wishes are updated in Epic and part of that is documenting a Environmental health practitioner for each patient  A Health Care Decision Maker is someone you choose who can make health care decisions for you if you are not able - who would you most want to do this for you????  was updated.      BPAs completed:  VID-19 Vaccine Summary  Which COVID-19 Vaccine was administered  Pfizer  Type:  Dates Given:                       Immunization History   Administered Date(s) Administered    COVID-19 VACC,MRNA,(PFIZER)(PF) 12/07/2019, 12/28/2019, 08/08/2020    Hepatitis A (Adult) 04/24/2007, 04/08/2008    Influenza Vaccine Quad (IIV4 PF) 6mo+ injectable 06/02/2020    Influenza Virus Vaccine, unspecified formulation 06/29/2015, 05/27/2019    PNEUMOCOCCAL POLYSACCHARIDE 23-VALENT 04/12/2005, 02/22/2011    PPD Test 04/08/2008, 06/30/2009, 10/06/2009    Pneumococcal Conjugate 13-Valent 02/04/2014    SMALLPOX,MONKEYPOX(PF)(JYNNEOS) 05/19/2021    TD(TDVAX),ADSORBED,2LF(IM)(PF) 12/22/2015    TdaP 03/14/2006       __________________________________________________________________________________________    SCREENINGS COMPLETED IN FLOWSHEETS    HARK Screening       AUDIT       PHQ2       PHQ9          P4 Suicidality Screener                GAD7       COPD Assessment       Falls Risk

## 2022-08-25 NOTE — Unmapped (Signed)
Internal Medicine Initial Visit        Assessment/Plan:       Essential hypertension  Overview:  History of malignant hypertension, heart failure  Currently on nebivolol 20 mg, entresto 97-103, amlodipine 10 mg every day,   Goal: < 130/80  BP Readings from Last 3 Encounters:   08/25/22 131/74   05/18/22 155/94   05/19/21 138/84     Imp: not at goal  Plan: I considered adding chlorthalidone,  but he has gout.   Will continue to follow. Consider adding spirinolactone in future.   Lab Results   Component Value Date    CREATININE 0.93 05/16/2022     Lab Results   Component Value Date    K 3.9 05/16/2022     Cardiomyopathy, unspecified type (CMS-HCC)  Overview:  Followed by [facility] cardiology:    nonischemic dilated cardiomyopathy, coronary angiography on 06/30/2014 which revealed normal coronary arteries with ejection fraction of 35-40%, HIV infection, mixed hyperlipidemia, hypertension, and resistant hypertension. Has had negative renal artery duplex in July 2018.   Echo 06/2022 LVH, EF 55%  On entresto 97-103, nebivolol 20, amlodoipine 10 mg  Currently asx except for some mild dyspnea on exertion  Plan: follow BP      AIN (anal intraepithelial neoplasia) anal canal  04/2022 anal pap:   Neg  Human immunodeficiency virus (HIV) disease (CMS-HCC)  Overview:  On [name]  Followed by Dr Juel Burrow  Lab Results   Component Value Date    CREATININE 0.93 05/16/2022     Lab Results   Component Value Date    K 3.9 05/16/2022     MSM, 2 times sexual intercourse in last 12 months. Uses condoms      Chronic pain of left knee  Will check uric acid  Might benefit from xray, PT in future  Chronic gout of multiple sites, unspecified cause  Check uric acid  Hyperglycemia  Blood sugar 141-will check A1C  OSA (obstructive sleep apnea)  Overview:  Uses CPAP  Followed by neurology in [place]  Mixed hyperlipidemia  Overview:  On rosuvastatin 10 mg, fish oil for high chol, high tg  Plan: CTM  Cardiovascular  # The 10-year ASCVD risk score (Arnett DK, et al., 2019) is: 5.8%    Health Maintenance  Health Maintenance   Topic Date Due    Serum Creatinine Monitoring  05/17/2023    Potassium Monitoring  05/17/2023    DTaP/Tdap/Td Vaccines (5 - Td or Tdap) 04/09/2027    Pneumococcal Vaccine 0-64 (4 - PPSV23 or PCV20) 08/03/2046    Hepatitis C Screen  Completed    COVID-19 Vaccine  Completed    Influenza Vaccine  Completed          Return in about 6 months (around 02/24/2023).  Next Visit:       Chief Complaint:      Benjamin Ortiz is a 41 y.o. male who presents to [facility]      Subjective:     HPI  Pt has hypertension on background of HIV.   Per pt: high cholesterol, hypertension. gout  Hypertension: on nebivolol, metoprolol, entresto. History of heart failure. Sees cardiology, Dr Jacinto Halim,   No history of MI. 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 Feels tightness.  BP at home: 143/83.   Does take fish oil.    No history of diabetes. Father has diabetes, valve disease  Uses CPAP-since March 2022.   Takes trazodone 50  mg prn.   Gout: had flare Sept this year. Has been on allopurinol every other day.   Non smoker. Beer: 4/week. No drugs.   Exercise: not like I should. Tries to walk around complex.   Lives with his brother.   Not currently   Sex with men-twice in last  year, uses condoms.     Last   Lab Results   Component Value Date    ACD4 1,269 05/19/2021      Lab Results   Component Value Date    HIVRS Detected (A) 05/19/2021     Patient Active Problem List   Diagnosis    Human immunodeficiency virus (HIV) disease (CMS-HCC)    Essential hypertension    Pain, dental    Echocardiogram abnormal    Chest pain    Dyspnea on exertion    Hyperlipidemia    Hypertensive urgency    Cardiomyopathy (CMS-HCC)    Temporary cerebral vascular dysfunction    AIN (anal intraepithelial neoplasia) anal canal    OSA (obstructive sleep apnea)         Current Outpatient Medications:     allopurinol (ZYLOPRIM) 100 MG tablet, Take 1 tablet (100 mg total) by mouth., Disp: , Rfl:     ascorbic acid, vitamin C, (VITAMIN C) 1000 MG tablet, Take 1 tablet (1,000 mg total) by mouth., Disp: , Rfl:     BYSTOLIC 20 mg Tab, , Disp: , Rfl:     ibuprofen (ADVIL,MOTRIN) 200 MG tablet, Take 2 tablets (400 mg total) by mouth., Disp: , Rfl:     rosuvastatin (CRESTOR) 10 MG tablet, Take 1 tablet (10 mg total) by mouth in the morning., Disp: , Rfl:     sacubitriL-valsartan (ENTRESTO) 97-103 mg tablet, Take 1 tablet by mouth two (2) times a day., Disp: , Rfl:     traZODone (DESYREL) 50 MG tablet, Take 0.5 tablets (25 mg total) by mouth., Disp: , Rfl:     amlodipine (NORVASC) 10 MG tablet, Take 1 tablet (10 mg total) by mouth daily., Disp: , Rfl:     blood pressure test kit-large Kit, 1 mL by Miscellaneous route once daily., Disp: 1 each, Rfl: 0    dolutegravir-lamivudine ([name]) 50-300 mg Tab, Take 1 tablet by mouth daily., Disp: 30 tablet, Rfl: 11     Sex with men. Not in relationship.  Says he  uses condoms.  Benjamin Ortiz  reports that he quit smoking about 11 years ago. His smoking use included cigarettes. He has never used smokeless tobacco. He reports current alcohol use of about 5.0 standard drinks of alcohol per week. He reports that he does not use drugs.       The past medical history, surgical history, family history, social history, medications and allergies were reviewed in Epic.     Review of Systems    The balance of 10 systems was reviewed and unremarkable except as stated above.        Objective:   PHYSICAL EXAMINATION:  BP 131/74 (BP Site: L Arm, BP Position: Sitting, BP Cuff Size: Medium) Comment: average - Pulse 65  - Temp 35.9 ??C (96.6 ??F) (Temporal)  - Resp 18  - Ht 182.9 cm (6')  - Wt 97.7 kg (215 lb 6.4 oz)  - SpO2 95%  - BMI 29.21 kg/m??   General: Well appearing, no acute distress  HEENT: Masked.  No cervical LAD, No thyromegaly or nodules.  Chest: no chest wall deformities  Pulm: clear to auscultation  CV: RRR,  no murmurs, gallops or rubs  Abd: S, NT, ND, No HSM.  Ext: no edema  Skin: No rashes  Neuro: Normal gait; strength normal, grossly  Psych: nl mood and affect  ZOX:WRUE knee: crepitance with bending        Records Review  Wt Readings from Last 3 Encounters:   08/25/22 97.7 kg (215 lb 6.4 oz)   05/18/22 98.2 kg (216 lb 6.4 oz)   05/19/21 95.8 kg (211 lb 3.2 oz)       Lab Results   Component Value Date    CREATININE 0.93 05/16/2022    CHOL 184 05/16/2022    HDL 28 (L) 05/16/2022    LDL 53 05/16/2022    NONHDL  05/19/2021      Comment:      Non-HDL Cholesterol Recommended Ranges (mg/dL)  Optimal      <454  Near Optimal130 - 159  Borderline High160 - 189  High            190 - 219  Very High      >220      TRIG 700 (HH) 05/16/2022    A1C 5.6 05/19/2021      There are no preventive care reminders to display for this patient.     The 10-year ASCVD risk score (Arnett DK, et al., 2019) is: 5.8%        Medication adherence and barriers to the treatment plan have been addressed. Opportunities to optimize healthy behaviors have been discussed. Patient / caregiver voiced understanding.    I personally spent 45 minutes face-to-face and non-face-to-face in the care of this patient, which includes all pre, intra, and post visit time on the date of service.

## 2022-08-26 NOTE — Unmapped (Signed)
Welcome to the [facility] Internal Medicine Clinic, a part of the [facility] of Medicine and [facility]. Thank you for choosing [place] for your primary care needs.        Provider Teams  Our clinic believes in physician-directed care teams that include a variety of caregivers.  You may receive care from physicians, nurse practioners, physician assistants, pharmacists, dieticians, counselors, and/or social workers.    Your doctor may also be completing post-graduate training at [facility] in order to become a board certified in internal medicine.  These resident physicians are organized into teams of several doctors including an attending or faculty physician.  If your doctor is not available on a particular day, another doctor will take care of you.    Office Hours  8 am-5 pm Monday through Friday    Getting Ready for your Visits:  Please arrive 15 minutes prior to your appointment time.  Please fill out any questionnaires that your provider has asked you to complete either on paper or My [facility] Chart.    Services:  Same Day Care (8:30am-4:30pm daily-please call for an appointment)  Laboratory (full service lab located on first floor and point of care labs)  Enhanced Care Program (Diabetes, Hypertension, Heart Failure, Chronic Pain, Depression, Anticoagulation Monitoring)  Financial Counselor (located adjacent to the pharmacy)  Outpatient Pharmacy (co-located adjacent to the IM clinic)    Important Numbers  1. How do I schedule an appointment or get a message to my doctor?  Call [phone] or toll free [phone]. Press 1 for English or 2 for Spanish. Our fax number is [phone].    2. Same Day Clinic   I'm sick today and need an appointment during office hours. Who do I call?  Call [phone] or toll free [phone], ask for an appointment in the Same Day Clinic  Same Day Clinic is located on the 5th floor at [address], [place] [address]    3. How do I cancel or reschedule an appointment?  Call [phone] or toll free [phone].   You may also cancel your appointment by using [facility] (patient portal).    4. How do I request medication refills?  Request a refill via [facility] (patient portal), call clinic at [phone] or have your pharmacy fax the request to [phone].    [facility]: [phone] *Pharmacy can mail medications to your home. You must call to request the medication be mailed.[facility]: [phone]  [facility]: [phone]    5. After Hours, Weekend, or Holidays:  It's after 5:00pm during the week or it's the weekend. How do I get medical attention?    Call the [facility] 24/7 Nursing Line [phone] to get nurse advice.  Go to [facility] walk-in clinic at [address], [place], [place]; [phone]; 7 days a week from 9:00AM - 8:00PM.    6. Care Manager/Financial Counseling  I'm having trouble with my health because of cost, my mood, trouble getting to clinic, or where I live. How can I get help? Your Care Manager can help!  Call Jennell Corner, LCSW(Bilingual) at [phone] or Carolyne Littles, LCSW-A at [phone] for assistance.  They are available Monday-Friday 8:00AM -5:00PM  [facility] Internal Medicine at [facility] Counselor: Ivor Costa [phone]  [facility] Internal Medicine at [facility] Counselor(Bilingual): Doyce Para [phone]  [facility] Pharmacy Assistance([place] [facility]): [phone]    7.  [employer]  I'm feeling unsafe, have experienced physical abuse, threats, emotional abuse, sexual abuse or other violence. Who do I call for confidential advice and assistance?  Call [phone] Monday through Fridays 9:00am-4:30pm. Call [phone] after hours.      Clinic Website: [email or web address]      Our clinic is proud to be recognized as a patient-centered medical home.

## 2022-08-30 NOTE — Unmapped (Signed)
[facility] Clinical Assessment & Refill Coordination Note    Benjamin Ortiz, DOB: August 02, 1981  Phone: [phone] (home)     All above HIPAA information was verified with patient.     Was a Nurse, learning disability used for this call? No    Specialty Medication(s):   Infectious Disease: [name]     Current Outpatient Medications   Medication Sig Dispense Refill    allopurinol (ZYLOPRIM) 100 MG tablet Take 1 tablet (100 mg total) by mouth.      amlodipine (NORVASC) 10 MG tablet Take 1 tablet (10 mg total) by mouth daily.      ascorbic acid, vitamin C, (VITAMIN C) 1000 MG tablet Take 1 tablet (1,000 mg total) by mouth.      blood pressure test kit-large Kit 1 mL by Miscellaneous route once daily. 1 each 0    BYSTOLIC 20 mg Tab       dolutegravir-lamiVUDine ([name]) 50-300 mg Tab Take 1 tablet by mouth daily. 30 tablet 11    ibuprofen (ADVIL,MOTRIN) 200 MG tablet Take 2 tablets (400 mg total) by mouth.      rosuvastatin (CRESTOR) 10 MG tablet Take 1 tablet (10 mg total) by mouth in the morning.      sacubitriL-valsartan (ENTRESTO) 97-103 mg tablet Take 1 tablet by mouth two (2) times a day.      traZODone (DESYREL) 50 MG tablet Take 0.5 tablets (25 mg total) by mouth.       No current facility-administered medications for this visit.        Changes to medications: [name] reports no changes at this time.    Allergies   Allergen Reactions    Enalapril Cough       Changes to allergies: No    SPECIALTY MEDICATION ADHERENCE     [name] 50-300 mg: 4 days of medicine on hand       Medication Adherence    Patient reported X missed doses in the last month: 1  Specialty Medication: [name] 50-300mg   Patient is on additional specialty medications: No  Any gaps in refill history greater than 2 weeks in the last 3 months: yes  Demonstrates understanding of importance of adherence: yes  Informant: patient  Provider-estimated medication adherence level: good  Patient is at risk for Non-Adherence: No Specialty medication(s) dose(s) confirmed: Regimen is correct and unchanged.     Are there any concerns with adherence?  No. He reports having extra medication on hand when I asked him about the gap in fill dates.    Adherence counseling provided? Not needed    CLINICAL MANAGEMENT AND INTERVENTION      Clinical Benefit Assessment:    Do you feel the medicine is effective or helping your condition? Yes    HIV ASSOCIATED LABS:     Lab Results   Component Value Date/Time    HIVRS Detected (A) 05/19/2021 04:43 PM    HIVRS Not Detected 06/02/2020 11:03 AM    HIVRS Not Detected 06/11/2019 11:45 AM    HIVRS Not Detected 08/19/2014 12:24 PM    HIVRS Not Detected 02/04/2014 11:08 AM    HIVRS Detected 01/22/2013 02:27 PM    HIVCP <40 05/16/2022 10:30 AM    HIVCP <40 (H) 05/19/2021 04:43 PM    HIVCP <40 12/18/2019 12:01 PM    HIVCP <40 (H) 02/06/2018 10:31 AM    HIVCP <40 (H) 04/25/2017 11:09 AM    HIVCP <40 01/22/2013 02:27 PM    ACD4 1,269 05/19/2021  04:43 PM    ACD4 980 06/02/2020 11:03 AM    ACD4 1,149 06/11/2019 11:45 AM    ACD4 860 08/19/2014 12:24 PM    ACD4 802 02/04/2014 11:08 AM    ACD4 1,143 01/22/2013 02:27 PM       Clinical Benefit counseling provided? Labs from 05/16/22 show evidence of clinical benefit    Adverse Effects Assessment:    Are you experiencing any side effects? No    Are you experiencing difficulty administering your medicine? No    Quality of Life Assessment:      How many days over the past month did your HIV  keep you from your normal activities? For example, brushing your teeth or getting up in the morning. 0    Have you discussed this with your provider? Not needed    Acute Infection Status:    Acute infections noted within Epic:  No active infections  Patient reported infection: None    Therapy Appropriateness:    Is therapy appropriate and patient progressing towards therapeutic goals? Yes, therapy is appropriate and should be continued    DISEASE/MEDICATION-SPECIFIC INFORMATION N/A    HIV: Not Applicable    PATIENT SPECIFIC NEEDS     Does the patient have any physical, cognitive, or cultural barriers? No    Is the patient high risk? No    Did the patient require a clinical intervention? No    Does the patient require physician intervention or other additional services (i.e., nutrition, smoking cessation, social work)? No    SOCIAL DETERMINANTS OF HEALTH     At the [facility], we have learned that life circumstances - like trouble affording food, housing, utilities, or transportation can affect the health of many of our patients.   That is why we wanted to ask: are you currently experiencing any life circumstances that are negatively impacting your health and/or quality of life? Patient declined to answer    Social Determinants of Health     Financial Resource Strain: Not on file   Internet Connectivity: Not on file   Food Insecurity: Not on file   Tobacco Use: Medium Risk (08/25/2022)    Patient History     Smoking Tobacco Use: Former     Smokeless Tobacco Use: Never     Passive Exposure: Not on file   Housing/Utilities: Not on file   Alcohol Use: Not on file   Transportation Needs: Not on file   Substance Use: Not on file   Health Literacy: Not on file   Physical Activity: Not on file   Interpersonal Safety: Not on file   Stress: Not on file   Intimate Partner Violence: Not on file   Depression: Not at risk (08/25/2022)    PHQ-2     PHQ-2 Score: 0   Social Connections: Not on file       Would you be willing to receive help with any of the needs that you have identified today? Not applicable       SHIPPING     Specialty Medication(s) to be Shipped:   Infectious Disease: [name]    Other medication(s) to be shipped: No additional medications requested for fill at this time     Changes to insurance: No    Delivery Scheduled: Yes, Expected medication delivery date: 09/01/22.     Medication will be delivered via UPS to the confirmed prescription address in [facility].    The patient will receive a drug information handout for each medication  shipped and additional FDA Medication Guides as required.  Verified that patient has previously received a Conservation officer, historic buildings and a Surveyor, mining.    The patient or caregiver noted above participated in the development of this care plan and knows that they can request review of or adjustments to the care plan at any time.      All of the patient's questions and concerns have been addressed.    Roderic Palau, PharmD   [facility] Specialty Pharmacist

## 2022-08-31 MED FILL — DOVATO 50 MG-300 MG TABLET: ORAL | 30 days supply | Qty: 30 | Fill #2

## 2022-09-06 NOTE — Unmapped (Signed)
LM, sent this text, and My Chart message,  [facility]: Benjamin Ortiz, we weren't able to reach you via phone call. Your appointment on 1/31st with Dr. Juel Burrow has been rescheduled to 1/10th at 3:30 due to the provider's schedule change. If this change does not work for your schedule, please give our office a call at [phone]. Thank you, we apologize for the inconvenience.

## 2022-09-21 ENCOUNTER — Institutional Professional Consult (permissible substitution)
Admit: 2022-09-21 | Discharge: 2022-09-22 | Payer: PRIVATE HEALTH INSURANCE | Attending: Registered" | Primary: Registered"

## 2022-09-21 DIAGNOSIS — R7303 Prediabetes: Principal | ICD-10-CM

## 2022-09-21 NOTE — Unmapped (Addendum)
Nutrition Goals:  Reduce consumption of sugar-sweetened beverages (juice, soda, sweet tea, gatorade) and replace with zero-calorie, diet, no sugar added choices and water with flavored packets.  Healthier dining out options:  Whole-grain or whole wheat bread  Lean protein (chicken, tuna or [place]) with light cheese  Vegetable sources such as lettuce, pickles, [place] peppers  Healthier sides: pretzels or baked lays or fruit  Water as a beverage  My plate guidelines:  1/2 plate vegetable  1/4 plate starchy or whole-grain  1/4 plate of lean protein source  4. Reduce consumption of fried foods and replace with lean cooking methods such as baking, broiling, steaming, air-frying, grilling.  5. Start to include a yogurt for breakfast such as Oikos Triple Zero or Chobani low sugar added or Activia with 1 piece of fruit-can increase to 2 servings if needed.  6. Increase physical activity by walking, body-weight exercises 150 minutes per week.  7. Healthier snack ideas:  -yogurt (no sugar added)  -fruit  -nuts  -triscuits with peanut butter, pimento cheese  -unsweetened apple sauce with string cheese  8. If replacing a meal with a protein shake, choose Glucerna or Premier Protein

## 2022-09-21 NOTE — Unmapped (Addendum)
[facility]  Adult Medical Nutrition Therapy - Initial Assessment/Care Plan  Date: 09/21/2022    Patient Name:             Benjamin Ortiz    Date of Birth / Age / Birth Gender:            [birth year] / 42 y.o. / Male    Referring MD or Clinic:             Artelia Laroche, MD      Interpreter:            No      Reason for Referral:   General, healthful diet order yes: prediabetes      Nutritional Concerns/Comments:  42 y.o. male seeking nutritional counseling for prediabetes.    Pt reports to initial nutrition visit with BMI of 29.21 kg/m2 (overweight) with a history of OSA, cardiomyopathy, dyspnea, HTN seeking nutritional counseling to mitigate elevated glucose levels.     In the past year, pt has seen an increase in glucose levels (hbA1c%: 5.6- 6.1) with minimal weight gain. Pt attributes this increase to undesirable food choices, increased portions and excessive sugar-sweetened beverage intake. Pt reports an irregular meal schedule and consumes fast-food at least 2x per day.     Today, RD and pt discussed:  -consistent eating pattern  -the plate method  -reduction of SSB  -reduction of fried and processed foods  -increase in dietary fiber and lean protein sources  -incorporation of whole-grains/starchy vegetables  -reduction of overall fat intake (due to elevated TG)    DYSGLYCEMIA: Elevated and seems to be attributed to undesirable food choices and excessive sugar-sweetened beverage intake.    Component  Ref Range & Units 08/25/22 1630 05/19/21 1643 12/06/11 1403     Hemoglobin A1C  4.8 - 5.6 % 6.1 High  5.6 5.4 R    Estimated Average Glucose  mg/dL 161 096 045 R   Resulting Agency [facility] [place]        DIET: Pt typically skips breakfast. First meal is around 11am and is [employer] or Chick-fil-a. Second meal is either [place], [place] or hibachi. Beverages include: dark soda, light soda, juice, sweet tea.    PHYSICAL ACTIVITY: Pt walks frequently at his workplace    FOOD INSECURITY: Pt screens positive. Pt does not access a food pantry nor receives SNAP benefits. RD discussed re-applying for SNAP benefits and will send available pantries to pt.    Anthropometric Data:   Height:   6'   Weight:   215 lbs   BMI:   29.21 kg/m2                Weight History:               Wt Readings from Last 6 Encounters:   08/25/22 97.7 kg (215 lb 6.4 oz)   05/18/22 98.2 kg (216 lb 6.4 oz)   05/19/21 95.8 kg (211 lb 3.2 oz)   06/02/20 95.5 kg (210 lb 9.6 oz)   07/25/19 95.7 kg (210 lb 14.4 oz)   06/11/19 95.7 kg (210 lb 14.4 oz)       Usual body weight: 210 lbs    Ideal Body Weight:  171 lbs  Goal weight: 210 lbs      Physical Findings Data:               BP Today  n/a (telephone encounter)  BP History:                 BP Readings from Last 6 Encounters:   08/25/22 131/74   05/18/22 155/94   05/19/21 138/84   06/02/20 130/90   07/25/19 122/71   06/11/19 123/83       Biochemical Data:              A1C:    Hemoglobin A1C   Date Value Ref Range Status   08/25/2022 6.1 (H) 4.8 - 5.6 % Final   12/06/2011 5.4 4.8 - 6.0 % Final                 Estimated Blood Glucose:   Lab Results   Component Value Date    Estimated Average Glucose 128 08/25/2022    Estimated Average Glucose 108 12/06/2011                Total Cholesterol:   Cholesterol, Total   Date Value Ref Range Status   05/16/2022 184 100 - 199 mg/dL Final                Trig:   Triglycerides   Date Value Ref Range Status   05/16/2022 700 (HH) 0 - 149 mg/dL Final                 HDL:   HDL   Date Value Ref Range Status   05/16/2022 28 (L) >39 mg/dL Final                  LDL:   LDL Calculated   Date Value Ref Range Status   05/16/2022 53 0 - 99 mg/dL Final       Current Medications, Herbs, Supplements:    Current Outpatient Medications:     allopurinol (ZYLOPRIM) 100 MG tablet, Take 1 tablet (100 mg total) by mouth., Disp: , Rfl:     amlodipine (NORVASC) 10 MG tablet, Take 1 tablet (10 mg total) by mouth daily., Disp: , Rfl:     ascorbic acid, vitamin C, (VITAMIN C) 1000 MG tablet, Take 1 tablet (1,000 mg total) by mouth., Disp: , Rfl:     blood pressure test kit-large Kit, 1 mL by Miscellaneous route once daily., Disp: 1 each, Rfl: 0    BYSTOLIC 20 mg Tab, , Disp: , Rfl:     dolutegravir-lamiVUDine ([name]) 50-300 mg Tab, Take 1 tablet by mouth daily., Disp: 30 tablet, Rfl: 11    ibuprofen (ADVIL,MOTRIN) 200 MG tablet, Take 2 tablets (400 mg total) by mouth., Disp: , Rfl:     rosuvastatin (CRESTOR) 10 MG tablet, Take 1 tablet (10 mg total) by mouth in the morning., Disp: , Rfl:     sacubitriL-valsartan (ENTRESTO) 97-103 mg tablet, Take 1 tablet by mouth two (2) times a day., Disp: , Rfl:     traZODone (DESYREL) 50 MG tablet, Take 0.5 tablets (25 mg total) by mouth., Disp: , Rfl:       HIV:   Lab Results   Component Value Date    ACD4 1,269 05/19/2021    CD4TABS 960 05/16/2022    CD4 47 05/19/2021    CD4TCELL 40.0 05/16/2022    HIVRS Detected (A) 05/19/2021    HIVCP <40 05/16/2022        Ecosocial History:  Client is not a SNAP recipient  Client is not a food bank/pantry recipient  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 For each statement, please tell me whether the statement was often true, sometimes true, or never true for your household in the last month.     1. ???We worried whether our food would run out before we got money to buy more.???   Often True     2. ???The food that we bought just didn't last, and we didn't have money to get more.  Often True       Usual Daily Food Choices:      24-Hour Recall/Usual Intake:  Time Intake   Breakfast    Snack (AM)    Lunch 11am: Chick-fil-a or bojangles   Snack (PM)    Dinner 7-8pm: subway or [employer] or [place] or [place] food   Snack (HS)        Food and Nutrient Intake:  Snacks: chips, popcorn, pretzels, snack cakes  Beverages:  soda, kool-aid, juice, water  Dining Out:  14-21x per week  Cooking Methods: frying, air-frying  Usual Food Choices: fried chicken, sandwiches, mashed potatoes   Meal Schedule:  skips breakfast and eats lunch and dinner    Behavioral Factors:  Overeating: Consumes sugar-sweetened beverages in large portions.   Emotional Eating: No issues noted.   Grazing: Denied issues.   Fast Eating: Denied issues.   Nighttime Eating: None.    Factors Affecting Food Intake:  Knowledge and Beliefs: He has not previously met with dietitian or participated in nutrition program.   Stress/Anxiety: Did not report issues with stress/anxiety.   Sleep Patterns: Did not report issues with sleeping habits and patterns.  Food Safety and Access: He reports limited finances. Pt does not receive SNAP benefits.  Other: n/a        Food Intolerances/Dietary Restrictions:  No known food allergies or food intolerances.     Other GI Issues:  Denied issues    Hunger and Satiety:  Denied issues.       Allergies:  is allergic to enalapril.      Usual Daily Physical Activity Factors: >/=1.0 to <1.4 (Sedentary)  Method for estimating physical activity factor: 2020-2025 DRI Guidelines    Estimated Energy Needs:    2300 kcal Resting Metabolic Rate by Mifflin-St Jeor equation  -500 kcal Calorie difference for weight goal  1800 kcal Total energy estimated need for weight goal    Protein:98-117 g/kg  [1.0-1.2 g/kg]  Carbohydrate: 40-65%  Fat: total fat: 20-35% of kcal, saturated fat: < 7% of kcal , trans fat: as low as possible  Fiber: 30-35g   Sodium:  <2400 mg   Cholesterol: <200 mg  Added Sugars: <10% of kcal    Fluid: 1800-2300 ml/kcal {85ml/kcal]    Estimated Adherence:  Client self-reported adherence score 8  Method for estimating adherence: Adult Meducation Readiness Ruler      NUTRITION DIAGNOSIS:   Excessive  energy intake   Inadequate  intake of grains  Inadequate  intake of fruits  Inadequate  intake of vegetables  Inadequate  intake of milk/milk products  Inadequate  intake of meat, poultry, fish, eggs, beans, nut products    Method for estimating nutritional adequacy: 2020-2025 DRI Guidelines     Clinical Diagnosis:   Altered nutrition-related laboratory values related to undesirable food choices as evidenced by most recent hbA1c% of 6.1% and self-reported >  50% of intake from fast-food.            NUTRITION INTERVENTIONS/PLAN OF CARE    Nutrition Counseling     Nutrition Education     Realistic weight goal setting: 0.5 to up to 2 lbs per week.                                                   Comprehensive weight management program includes a reduced calorie diet, increased fruit/vegetable/whole grain consumption and increased physical activity by 30 minutes, 5x per week.    Nutrient adequacy during weight loss provided for in individualized meal plan that includes client???s preferences: energy deficit of 250-500 kcal for a total caloric consumption of 1800 kcal/day.    Behavior therapy strategies of self-monitoring food intake, physical activity and goal setting.    Coordination of care via Mail and Medical Record    Client agreed to follow interventions:  Yes       Nutrition-Related Medication Management: Nutrition-related complimentary/alternative medicine      Nutrition Goals:  Reduce consumption of sugar-sweetened beverages (juice, soda, sweet tea, gatorade) and replace with zero-calorie, diet, no sugar added choices and water with flavored packets.  Healthier dining out options:  Whole-grain or whole wheat bread  Lean protein (chicken, tuna or [place]) with light cheese  Vegetable sources such as lettuce, pickles, [place] peppers  Healthier sides: pretzels or baked lays or fruit  Water as a beverage  My plate guidelines:  1/2 plate vegetable  1/4 plate starchy or whole-grain  1/4 plate of lean protein source  4. Reduce consumption of fried foods and replace with lean cooking methods such as baking, broiling, steaming, air-frying, grilling.  5. Start to include a yogurt for breakfast such as Oikos Triple Zero or Chobani low sugar added or Activia with 1 piece of fruit-can increase to 2 servings if needed.  6. Increase physical activity by walking, body-weight exercises 150 minutes per week.  7. Healthier snack ideas:  -yogurt (no sugar added)  -fruit  -nuts  -triscuits with peanut butter, pimento cheese  -unsweetened apple sauce with string cheese  8. If replacing a meal with a protein shake, choose Glucerna or Premier Protein    Patient Education:  Individualized nutrition counseling and education provided on:   My Plate Guidelines  Stable Meal Schedule  Lean Protein Sources     Materials Provided:  Patient instructions: nutrition-related recommendations   Handouts supporting dietary recommendations: n/a  Additional Materials:   Written nutrition tips         NUTRITION MONITORING AND EVALUATION:  Follow-up appointment: telephone/video  in 2 weeks (January 16th at 1130am)      Goals for next appointment:   Reduce consumption of sugar-sweetened beverages (juice, soda, sweet tea, gatorade) and replace with zero-calorie, diet, no sugar added choices and water with flavored packets.  Healthier dining out options:  Whole-grain or whole wheat bread  Lean protein (chicken, tuna or [place]) with light cheese  Vegetable sources such as lettuce, pickles, [place] peppers  Healthier sides: pretzels or baked lays or fruit  Water as a beverage  My plate guidelines:  1/2 plate vegetable  1/4 plate starchy or whole-grain  1/4 plate of lean protein source  4. Reduce consumption of fried foods and replace with lean cooking methods such as baking, broiling, steaming, air-frying, grilling.  5. Start to include a yogurt for breakfast such as Oikos Triple Zero or Chobani low sugar added or Activia with 1 piece of fruit-can increase to 2 servings if needed.  6. Increase physical activity by walking, body-weight exercises 150 minutes per week.  7. Healthier snack ideas:  -yogurt (no sugar added)  -fruit  -nuts  -triscuits with peanut butter, pimento cheese  -unsweetened apple sauce with string cheese                                     8. If replacing a meal with a protein shake, choose Glucerna or Premier Protein    Number of preferred RD visits per dx: 6 visits    Plan of care time frame: 6 months    Length of visit was:  38  minutes      The patient reports they are physically located in [place] and is currently: at home. I conducted a phone visit.  I spent 38 minutes on the phone call with the patient on the date of service .        Signed:  Unk Pinto  09/21/2022 2:04 PM

## 2022-10-04 ENCOUNTER — Institutional Professional Consult (permissible substitution): Admit: 2022-10-04 | Payer: PRIVATE HEALTH INSURANCE | Attending: Registered" | Primary: Registered"

## 2022-10-04 NOTE — Unmapped (Signed)
[facility]  Adult Medical Nutrition Therapy - Monitoring Assessment  Date: 10/04/2022    Patient Name:             Benjamin Ortiz    Date of Birth / Age / Birth Gender:            Jun 19, 1981 / 42 y.o. / Male    Referring MD or Clinic:             Self, Referred    Reason for Referral:   General, healthful diet order yes: prediabetes    Interpreter:            No      NUTRITION ASSESSMENT     New developments since last nutrition appointment on 09/21/2022:  42 y.o. year old male seeking nutritional counseling for prediabetes.    Pt reports to nutrition follow-up visit with BMI of 29.21 kg/m2 (overweight) and a history of HTN, OSA, HLD, cardiomyopathy, dyspnea and CVD.    Since initial nutrition visit, pt reports strong adherence to nutrition-related recommendations such as the reduction of sugar-sweetened beverages, choosing healthier options while dining out and eating on a schedule. Pt states that he is feeling more energized and less sluggish since making these changes. RD gave positive reinforcement to continue.    PREDIABETES: Elevated and seems to be correlated with increased consumption of undesirable food choices such as added sugars, fried foods and processed carbohydrates.    Component  Ref Range & Units 08/25/22 1630 05/19/21 1643 12/06/11 1403     Hemoglobin A1C  4.8 - 5.6 % 6.1 High  5.6 5.4 R    Estimated Average Glucose  mg/dL 161 096 045 R       DIET: For breakfast, pt will have honey nut cheerios and milk. For lunch, pt will have yogurt-triple zero. For dinner, pt will have various options that include lean proteins, vegetables and whole-grains. Beverages include water and diet sodas.    PHYSICAL ACTIVITY: Pt walks frequently at his workplace.    FOOD INSECURITY: Pt screens positive. Pt does not access to a food pantry nor receives SNAP benefits. Pt plans to apply for SNAP next week.      Physical Findings Data:               BP Today: n/a (telephone/video visit encounter)    Anthropometric Data:   Weight:  215 lbs clinic measure     BMI:   29.21 kg/m2      Weight Assessment:     Wt Readings from Last 5 Encounters:   08/25/22 97.7 kg (215 lb 6.4 oz)   05/18/22 98.2 kg (216 lb 6.4 oz)   05/19/21 95.8 kg (211 lb 3.2 oz)   06/02/20 95.5 kg (210 lb 9.6 oz)   07/25/19 95.7 kg (210 lb 14.4 oz)        Usual Daily Food Choices:    24-Hour Recall or Usual Intake:  Time Intake   Breakfast [place] cheerios with milk   Snack (AM)    Lunch yogurt   Snack (PM)    Dinner Veterinary surgeon, ground [place] casserole, cabbage and spinach dip  Or grilled chicken wrap  Or chopped salad   Snack (HS)        Behavioral Factors:  Overeating: Denied issues with overeating.   Emotional Eating: No issues noted.   Grazing: Denied issues.   Fast Eating: Denied issues.   Nighttime Eating: None.    Factors Affecting Food Intake:  Knowledge and Beliefs: He presents with good adherence to nutrition recommendations. Food and nutrition related knowledge has improved with nutrition education and counseling. He has willingness to try new foods.  Stress/Anxiety: Did not report issues with stress/anxiety.   Sleep Patterns: Did not report issues with sleeping habits and patterns.  Food Safety and Access: He noted limited finances and resources.   Other: n/a    Food Intolerances/Dietary Restrictions:  No known food allergies or food intolerances.     Other GI Issues:  Denied issues    Hunger and Satiety:  Denied issues.       Allergies:  is allergic to enalapril.    Food Insecurity:  I'm going to read you two statements that people have made about their food situation. For each statement, please tell me whether the statement was often true, sometimes true, or never true for your household in the last month.      1. ???We worried whether our food would run out before we got money to buy more.???   Often True     2. ???The food that we bought just didn't last, and we didn't have money to get more.  Often True       Relevant Labs:  All relevant labs were reviewed and discussed.    Relevant Medications/Supplements:  Reviewed current medications. Reviewed nutritionally relevant medications and supplements.  Current Outpatient Medications   Medication Sig Dispense Refill    allopurinol (ZYLOPRIM) 100 MG tablet Take 1 tablet (100 mg total) by mouth.      amlodipine (NORVASC) 10 MG tablet Take 1 tablet (10 mg total) by mouth daily.      ascorbic acid, vitamin C, (VITAMIN C) 1000 MG tablet Take 1 tablet (1,000 mg total) by mouth.      blood pressure test kit-large Kit 1 mL by Miscellaneous route once daily. 1 each 0    BYSTOLIC 20 mg Tab       dolutegravir-lamiVUDine ([name]) 50-300 mg Tab Take 1 tablet by mouth daily. 30 tablet 11    ibuprofen (ADVIL,MOTRIN) 200 MG tablet Take 2 tablets (400 mg total) by mouth.      rosuvastatin (CRESTOR) 10 MG tablet Take 1 tablet (10 mg total) by mouth in the morning.      sacubitriL-valsartan (ENTRESTO) 97-103 mg tablet Take 1 tablet by mouth two (2) times a day.      traZODone (DESYREL) 50 MG tablet Take 0.5 tablets (25 mg total) by mouth.       No current facility-administered medications for this visit.       Physical Activity:  Physical activity level is sedentary with little to no exercise.       Usual Daily Physical Activity Factors:   >/=1.0 to <1.4 (Sedentary)  Method for estimating physical activity factor: 2020-2025 DRI Guidelines      Estimated Adherence:   Client self-reported adherence score 10              Method for estimating adherence: Adult Meducation Readiness Ruler      NUTRITION DIAGNOSIS:   Inadequate  intake of fruits  Inadequate  intake of vegetables  Inadequate  intake of milk/milk products  Inadequate  intake of meat, poultry, fish, eggs, beans, nut products    Method for estimating nutritional adequacy: 2020-2025 DRI Guidelines     Clinical Diagnosis:   Altered nutrition-related laboratory values related to undesirable food choices as evidenced by most recent hbA1c% of 6.1% and self-reported >50% of intake from  fast-food.       NUTRITION INTERVENTIONS:      Nutrition Counseling   Nutrition Education   Care Coordination:Collaboration with other provider(s): ID CLINIC PROVIDER    Nutrition Goals:  Reduce consumption of sugar-sweetened beverages (juice, soda, sweet tea, gatorade) and replace with zero-calorie, diet, no sugar added choices and water with flavored packets.  Healthier dining out options:  Whole-grain or whole wheat bread  Lean protein (chicken, tuna or [place]) with light cheese  Vegetable sources such as lettuce, pickles, [place] peppers  Healthier sides: pretzels or baked lays or fruit  Water as a beverage  My plate guidelines:  1/2 plate vegetable  1/4 plate starchy or whole-grain  1/4 plate of lean protein source  4. Reduce consumption of fried foods and replace with lean cooking methods such as baking, broiling, steaming, air-frying, grilling.  5. Start to include a yogurt for breakfast such as Oikos Triple Zero or Chobani low sugar added or Activia with 1 piece of fruit-can increase to 2 servings if needed.  6. Increase physical activity by walking, body-weight exercises 150 minutes per week.  7. Healthier snack ideas:  -yogurt (no sugar added)  -fruit  -nuts  -triscuits with peanut butter, pimento cheese  -unsweetened apple sauce with string cheese  8. If replacing a meal with a protein shake, choose Glucerna or Premier Protein    Patient Education:  Individualized nutrition counseling and education provided on:   Stable Meal Schedule   The Plate Method       Materials Provided:  Patient instructions: nutrition-related goals  Handouts supporting dietary recommendations (sent via email/Epic/mail): n/a  Additional Materials:   Written nutrition tips       Client agreed to follow interventions:  Yes         NUTRITION MONITORING AND EVALUATION:  Follow-up appointment: telephone/video  in 1 month (February 16th at 1130am)      Goals for next appointment:   Reduce consumption of sugar-sweetened beverages (juice, soda, sweet tea, gatorade) and replace with zero-calorie, diet, no sugar added choices and water with flavored packets.  Healthier dining out options:  Whole-grain or whole wheat bread  Lean protein (chicken, tuna or [place]) with light cheese  Vegetable sources such as lettuce, pickles, [place] peppers  Healthier sides: pretzels or baked lays or fruit  Water as a beverage  My plate guidelines:  1/2 plate vegetable  1/4 plate starchy or whole-grain  1/4 plate of lean protein source  4. Reduce consumption of fried foods and replace with lean cooking methods such as baking, broiling, steaming, air-frying, grilling.  5. Start to include a yogurt for breakfast such as Oikos Triple Zero or Chobani low sugar added or Activia with 1 piece of fruit-can increase to 2 servings if needed.  6. Increase physical activity by walking, body-weight exercises 150 minutes per week.  7. Healthier snack ideas:  -yogurt (no sugar added)  -fruit  -nuts  -triscuits with peanut butter, pimento cheese  -unsweetened apple sauce with string cheese  8. If replacing a meal with a protein shake, choose Glucerna or Premier Protein                                        Number of visits with RD: 2 of 6 visits      Length of visit was:  16 minutes      The patient reports they  are physically located in [place] and is currently: at home. I conducted a phone visit.  I spent 16 minutes on the phone call with the patient on the date of service .        Signed:  Unk Pinto  10/04/2022 11:35 AM

## 2022-10-04 NOTE — Unmapped (Signed)
Reduce consumption of sugar-sweetened beverages (juice, soda, sweet tea, gatorade) and replace with zero-calorie, diet, no sugar added choices and water with flavored packets.  Healthier dining out options:  Whole-grain or whole wheat bread  Lean protein (chicken, tuna or [place]) with light cheese  Vegetable sources such as lettuce, pickles, [place] peppers  Healthier sides: pretzels or baked lays or fruit  Water as a beverage  My plate guidelines:  1/2 plate vegetable  1/4 plate starchy or whole-grain  1/4 plate of lean protein source  4. Reduce consumption of fried foods and replace with lean cooking methods such as baking, broiling, steaming, air-frying, grilling.  5. Start to include a yogurt for breakfast such as Oikos Triple Zero or Chobani low sugar added or Activia with 1 piece of fruit-can increase to 2 servings if needed.  6. Increase physical activity by walking, body-weight exercises 150 minutes per week.  7. Healthier snack ideas:  -yogurt (no sugar added)  -fruit  -nuts  -triscuits with peanut butter, pimento cheese  -unsweetened apple sauce with string cheese  8. If replacing a meal with a protein shake, choose Glucerna or Premier Protein

## 2022-10-13 NOTE — Unmapped (Signed)
[facility] Refill Coordination Note    Specialty Medication(s) to be Shipped:   Infectious Disease: [name]    Other medication(s) to be shipped: No additional medications requested for fill at this time     Ingram Meece, DOB: 11-07-1980  Phone: [phone] (home)       All above HIPAA information was verified with patient.     Was a Nurse, learning disability used for this call? No    Completed refill call assessment today to schedule patient's medication shipment from the [facility] [phone]).  All relevant notes have been reviewed.     Specialty medication(s) and dose(s) confirmed: Regimen is correct and unchanged.   Changes to medications: Simar reports no changes at this time.  Changes to insurance: No  New side effects reported not previously addressed with a pharmacist or physician: None reported  Questions for the pharmacist: No    Confirmed patient received a Conservation officer, historic buildings and a Surveyor, mining with first shipment. The patient will receive a drug information handout for each medication shipped and additional FDA Medication Guides as required.       DISEASE/MEDICATION-SPECIFIC INFORMATION        N/A    SPECIALTY MEDICATION ADHERENCE     Medication Adherence    Patient reported X missed doses in the last month: 0  Specialty Medication: dovato 50-300mg                                 Were doses missed due to medication being on hold? No    dovato 50-300mg    : 7 days of medicine on hand       REFERRAL TO PHARMACIST     Referral to the pharmacist: Not needed      [facility]     Shipping address confirmed in Epic.     Delivery Scheduled: Yes, Expected medication delivery date: 1/30.     Medication will be delivered via UPS to the prescription address in Epic WAM.    Westley Gambles   [facility] Specialty Technician

## 2022-10-17 MED FILL — DOVATO 50 MG-300 MG TABLET: ORAL | 30 days supply | Qty: 30 | Fill #3

## 2022-11-04 ENCOUNTER — Institutional Professional Consult (permissible substitution)
Admit: 2022-11-04 | Discharge: 2022-11-05 | Payer: PRIVATE HEALTH INSURANCE | Attending: Registered" | Primary: Registered"

## 2022-11-04 DIAGNOSIS — R7303 Prediabetes: Principal | ICD-10-CM

## 2022-11-04 NOTE — Unmapped (Signed)
[facility]  Adult Medical Nutrition Therapy - Monitoring Assessment  Date: 11/04/2022    Patient Name:             Benjamin Ortiz    Date of Birth / Age / Birth Gender:            1981-03-22 / 42 y.o. / Male    Referring MD or Clinic:             Self, Referred    Reason for Referral:   General, healthful diet order yes: prediabetes    Interpreter:            No      NUTRITION ASSESSMENT     New developments since last nutrition appointment on 10/04/2022:  42 y.o. year old male seeking nutritional counseling for prediabetes.    Pt reports to nutrition follow-up visit with BMI of 29.21 kg/m2 (overweight) and a history of HTN, OSA, HLD, cardiomyopathy, dyspnea and CVD.    Since follow-up nutrition visit, pt reports continued adherence to nutrition-related recommendations such as including more home-cooked meals and reducing his fast-food/restaurant meal intake which is not only helping with weight management but also his financial constraints/food insecurity status.    PREDIABETES: Elevated and seems to be correlated with previous increased consumption of undesirable food choices such as added sugars, fried foods and processed carbohydrates.    Component  Ref Range & Units 08/25/22 1630 05/19/21 1643 12/06/11 1403     Hemoglobin A1C  4.8 - 5.6 % 6.1 High  5.6 5.4 R    Estimated Average Glucose  mg/dL 161 096 045 R       DIET: For breakfast, pt will have honey nut cheerios and milk. For lunch, pt will have an orange and string cheese. Dinner, pt will have a chicken wrap with lettuce, pickles, light ranch and water.    PHYSICAL ACTIVITY: Pt walks frequently at his workplace.    FOOD INSECURITY: Pt screens positive. Pt does not access a food pantry nor receives SNAP benefits. RD encouraged applying for SNAP benefits at today's visit.      Physical Findings Data:               BP Today: n/a (telephone/video visit encounter)    Anthropometric Data:   Weight:  215 lbs clinic measure     BMI:   29.21 kg/m2      Weight Assessment:     Wt Readings from Last 5 Encounters:   08/25/22 97.7 kg (215 lb 6.4 oz)   05/18/22 98.2 kg (216 lb 6.4 oz)   05/19/21 95.8 kg (211 lb 3.2 oz)   06/02/20 95.5 kg (210 lb 9.6 oz)   07/25/19 95.7 kg (210 lb 14.4 oz)        Usual Daily Food Choices:    24-Hour Recall or Usual Intake:  Time Intake   Breakfast Bowl of cheerios   Snack (AM) Orange and string cheese   Lunch    Snack (PM)    Dinner Grilled chicken wrap with lettuce, pickles, light ranch dressing  Financial trader [place])  water   Snack (HS)        Behavioral Factors:  Overeating: Denied issues with overeating.   Emotional Eating: No issues noted.   Grazing: Denied issues.   Fast Eating: Denied issues.   Nighttime Eating: None.    Factors Affecting Food Intake:  Knowledge and Beliefs: He presents with good adherence to nutrition recommendations. Food and  nutrition related knowledge has improved with nutrition education and counseling. He has willingness to try new foods.  Stress/Anxiety: Did not report issues with stress/anxiety.   Sleep Patterns: Did not report issues with sleeping habits and patterns.  Food Safety and Access: He noted limited finances and resources.   Other: n/a    Food Intolerances/Dietary Restrictions:  No known food allergies or food intolerances.     Other GI Issues:  Denied issues    Hunger and Satiety:  Denied issues.       Allergies:  is allergic to enalapril.    Food Insecurity:  I'm going to read you two statements that people have made about their food situation. For each statement, please tell me whether the statement was often true, sometimes true, or never true for your household in the last month.      1. ???We worried whether our food would run out before we got money to buy more.???   Sometimes True     2. ???The food that we bought just didn't last, and we didn't have money to get more.  Often True       Relevant Labs:  All relevant labs were reviewed and discussed.    Relevant Medications/Supplements:  Reviewed current medications. Reviewed nutritionally relevant medications and supplements.  Current Outpatient Medications   Medication Sig Dispense Refill    allopurinol (ZYLOPRIM) 100 MG tablet Take 1 tablet (100 mg total) by mouth.      amlodipine (NORVASC) 10 MG tablet Take 1 tablet (10 mg total) by mouth daily.      ascorbic acid, vitamin C, (VITAMIN C) 1000 MG tablet Take 1 tablet (1,000 mg total) by mouth.      blood pressure test kit-large Kit 1 mL by Miscellaneous route once daily. 1 each 0    BYSTOLIC 20 mg Tab       dolutegravir-lamiVUDine ([name]) 50-300 mg Tab Take 1 tablet by mouth daily. 30 tablet 11    ibuprofen (ADVIL,MOTRIN) 200 MG tablet Take 2 tablets (400 mg total) by mouth.      rosuvastatin (CRESTOR) 10 MG tablet Take 1 tablet (10 mg total) by mouth in the morning.      sacubitriL-valsartan (ENTRESTO) 97-103 mg tablet Take 1 tablet by mouth two (2) times a day.      traZODone (DESYREL) 50 MG tablet Take 0.5 tablets (25 mg total) by mouth.       No current facility-administered medications for this visit.       Physical Activity:  Physical activity level is sedentary with little to no exercise.       Usual Daily Physical Activity Factors:   >/=1.0 to <1.4 (Sedentary)  Method for estimating physical activity factor: 2020-2025 DRI Guidelines      Estimated Adherence:   Client self-reported adherence score 9              Method for estimating adherence: Adult Meducation Readiness Ruler      NUTRITION DIAGNOSIS:   Inadequate  intake of fruits  Inadequate  intake of vegetables  Inadequate  intake of milk/milk products  Inadequate  intake of meat, poultry, fish, eggs, beans, nut products    Method for estimating nutritional adequacy: 2020-2025 DRI Guidelines     Clinical Diagnosis:   Altered nutrition-related laboratory values related to undesirable food choices as evidenced by most recent hbA1c% of 6.1% and self-reported >50% of intake from fast-food.       NUTRITION INTERVENTIONS:  Nutrition Counseling   Nutrition Education   Care Coordination:Collaboration with other provider(s): ID CLINIC PROVIDER    Nutrition Goals:  Continue to reduce the consumption of sugar-sweetened beverages (juice, soda, sweet tea, gatorade) and replace with zero-calorie, diet, no sugar added choices and water with flavored packets.  Use healthier dining out options when eating out:  Whole-grain or whole wheat bread  Lean protein (chicken, tuna or [place]) with light cheese  Vegetable sources such as lettuce, pickles, [place] peppers  Healthier sides: pretzels or baked lays or fruit  Water as a beverage  Continue to increase home-cooked meals and use My plate guidelines when formulating plate or bowl:  1/2 plate vegetable  1/4 plate starchy or whole-grain  1/4 plate of lean protein source    4. Increase physical activity by walking, body-weight exercises 150 minutes per week.  5. Healthier snack ideas at the workplace:  -yogurt (no sugar added)  -fruit  -nuts  -triscuits with peanut butter, pimento cheese  -unsweetened apple sauce with string cheese  6. If replacing a meal with a protein shake, choose Glucerna or Premier Protein    Patient Education:  Individualized nutrition counseling and education provided on:   Freight forwarder Provided:  Patient instructions: nutrition-related goals  Handouts supporting dietary recommendations (sent via email/Epic/mail): n/a  Additional Materials:   Written nutrition tips       Client agreed to follow interventions:  Yes         NUTRITION MONITORING AND EVALUATION:  Follow-up appointment: telephone/video  in 1 month (March 15th at 1030am)      Goals for next appointment:                                 1.Continue to reduce the consumption of sugar-sweetened beverages (juice, soda, sweet tea, gatorade) and replace with zero-calorie, diet, no sugar added choices and water with flavored packets.  Use healthier dining out options when eating out:  Whole-grain or whole wheat bread  Lean protein (chicken, tuna or [place]) with light cheese  Vegetable sources such as lettuce, pickles, [place] peppers  Healthier sides: pretzels or baked lays or fruit  Water as a beverage  Continue to increase home-cooked meals and use My plate guidelines when formulating plate or bowl:  1/2 plate vegetable  1/4 plate starchy or whole-grain  1/4 plate of lean protein source    4. Increase physical activity by walking, body-weight exercises 150 minutes per week.  5. Healthier snack ideas at the workplace:  -yogurt (no sugar added)  -fruit  -nuts  -triscuits with peanut butter, pimento cheese  -unsweetened apple sauce with string cheese  6. If replacing a meal with a protein shake, choose Glucerna or Premier Protein    Number of visits with RD: 2 of 6 visits      Length of visit was:  15 minutes      The patient reports they are physically located in [place] and is currently: at home. I conducted a phone visit.  I spent 15 minutes on the phone call with the patient on the date of service .        Signed:  Unk Pinto  11/04/2022 10:28 AM

## 2022-11-04 NOTE — Unmapped (Signed)
Nutrition Goals:  Continue to reduce the consumption of sugar-sweetened beverages (juice, soda, sweet tea, gatorade) and replace with zero-calorie, diet, no sugar added choices and water with flavored packets.  Use healthier dining out options when eating out:  Whole-grain or whole wheat bread  Lean protein (chicken, tuna or [place]) with light cheese  Vegetable sources such as lettuce, pickles, [place] peppers  Healthier sides: pretzels or baked lays or fruit  Water as a beverage  Continue to increase home-cooked meals and use My plate guidelines when formulating plate or bowl:  1/2 plate vegetable  1/4 plate starchy or whole-grain  1/4 plate of lean protein source    4. Increase physical activity by walking, body-weight exercises 150 minutes per week.  5. Healthier snack ideas at the workplace:  -yogurt (no sugar added)  -fruit  -nuts  -triscuits with peanut butter, pimento cheese  -unsweetened apple sauce with string cheese  6. If replacing a meal with a protein shake, choose Glucerna or Premier Protein

## 2022-11-10 NOTE — Unmapped (Signed)
[facility] Refill Coordination Note    Specialty Medication(s) to be Shipped:   Infectious Disease: [name]    Other medication(s) to be shipped: No additional medications requested for fill at this time     Fransisco Mooers, DOB: Apr 30, 1981  Phone: [phone] (home)       All above HIPAA information was verified with patient.     Was a Nurse, learning disability used for this call? No    Completed refill call assessment today to schedule patient's medication shipment from the [facility] [phone]).  All relevant notes have been reviewed.     Specialty medication(s) and dose(s) confirmed: Regimen is correct and unchanged.   Changes to medications: Jerran reports no changes at this time.  Changes to insurance: No  New side effects reported not previously addressed with a pharmacist or physician: None reported  Questions for the pharmacist: No    Confirmed patient received a Conservation officer, historic buildings and a Surveyor, mining with first shipment. The patient will receive a drug information handout for each medication shipped and additional FDA Medication Guides as required.       DISEASE/MEDICATION-SPECIFIC INFORMATION        N/A    SPECIALTY MEDICATION ADHERENCE     Medication Adherence    Specialty Medication: dovato 50-300mg               Were doses missed due to medication being on hold? No    [name] 50-300 mg Tab (dolutegravir-lamiVUDine)  : 6 days of medicine on hand       REFERRAL TO PHARMACIST     Referral to the pharmacist: Not needed      [facility]     Shipping address confirmed in Epic.     Patient was notified of new phone menu : No    Delivery Scheduled: Yes, Expected medication delivery date: 11/15/22.     Medication will be delivered via UPS to the prescription address in Epic WAM.    Buna Cuppett' [facility] Specialty Technician

## 2022-11-13 MED FILL — DOVATO 50 MG-300 MG TABLET: ORAL | 30 days supply | Qty: 30 | Fill #4

## 2022-12-02 ENCOUNTER — Institutional Professional Consult (permissible substitution)
Admit: 2022-12-02 | Discharge: 2022-12-03 | Payer: PRIVATE HEALTH INSURANCE | Attending: Registered" | Primary: Registered"

## 2022-12-02 DIAGNOSIS — R7303 Prediabetes: Principal | ICD-10-CM

## 2022-12-02 NOTE — Unmapped (Signed)
Nutrition Goals:  Continue to reduce the consumption of sugar-sweetened beverages (juice, soda, sweet tea, gatorade) and replace with zero-calorie, diet, no sugar added choices and water with flavored packets.  Use healthier dining out options when eating out:  Whole-grain or whole wheat bread  Lean protein (chicken, tuna or [place]) with light cheese  Vegetable sources such as lettuce, pickles, [place] peppers  Healthier sides: pretzels or baked lays or fruit  Water as a beverage  Continue to increase home-cooked meals and use My plate guidelines when formulating plate or bowl:  1/2 plate vegetable  1/4 plate starchy or whole-grain  1/4 plate of lean protein source    4. Increase physical activity by walking, body-weight exercises 150 minutes per week.  5. Healthier snack ideas at the workplace:  -yogurt (no sugar added)  -fruit  -nuts  -triscuits with peanut butter, pimento cheese  -unsweetened apple sauce with string cheese  6. If replacing a meal with a protein shake, choose Glucerna or Premier Protein

## 2022-12-02 NOTE — Unmapped (Signed)
[facility]  Adult Medical Nutrition Therapy - Monitoring Assessment  Date: 12/02/2022    Patient Name:             Benjamin Ortiz    Date of Birth / Age / Birth Gender:            1981/03/27 / 42 y.o. / Male    Referring MD or Clinic:             Self, Referred    Reason for Referral:   General, healthful diet order yes: prediabetes    Interpreter:            No      NUTRITION ASSESSMENT     New developments since last nutrition appointment on 11/04/2022:  42 y.o. year old male seeking nutritional counseling for prediabetes.    Pt reports to nutrition follow-up visit with BMI of 29.21 kg/m2 (overweight) and a history of HTN, OSA, HLD, cardiomyopathy, dyspnea and CVD.    Since follow-up nutrition visit, pt continues to improve dietary intake by choosing healthier dining out options and subsequently self-reports weight loss. Pt is eating more nutrient-dense snacks and reducing his overall intake of sugar-sweetened beverages.     PREDIABETES: Elevated and seems to be correlated with previous increased consumption of undesirable food choices such as added sugars, fried foods and processed carbohydrates.    Component  Ref Range & Units 08/25/22 1630 05/19/21 1643 12/06/11 1403     Hemoglobin A1C  4.8 - 5.6 % 6.1 High  5.6 5.4 R    Estimated Average Glucose  mg/dL 161 096 045 R       DIET: For breakfast, pt will have honey nut cheerios and milk or applesauce and string cheese. For lunch, pt will have a chopped salad from [employer]. For dinner, pt will have a caesar salad flatbread with chicken or [employer] and get mashed potatoes, baked chicken, green beans, corn.     PHYSICAL ACTIVITY: Pt walks frequently at his workplace.    FOOD INSECURITY: Pt screens positive. Pt does not access a food pantry nor receives SNAP benefits. Pt inquired about SNAP benefits and will only be eligible for $16 per month. RD still recommended applying as these $ can double at [employer].      Physical Findings Data: BP Today: n/a (telephone/video visit encounter)    Anthropometric Data:   Weight:  215 lbs clinic measure     BMI:   29.21 kg/m2      Weight Assessment:     Wt Readings from Last 5 Encounters:   08/25/22 97.7 kg (215 lb 6.4 oz)   05/18/22 98.2 kg (216 lb 6.4 oz)   05/19/21 95.8 kg (211 lb 3.2 oz)   06/02/20 95.5 kg (210 lb 9.6 oz)   07/25/19 95.7 kg (210 lb 14.4 oz)        Usual Daily Food Choices:    24-Hour Recall or Usual Intake:  Time Intake   Breakfast Apple sauce and string cheese   Snack (AM)    Lunch Chopped salad from [employer] (PM)    Dinner [employer] cor[employer]- green beans, corn, baked chicken, mashed potatoes    Snack (HS)        Behavioral Factors:  Overeating: Denied issues with overeating.   Emotional Eating: No issues noted.   Grazing: Denied issues.   Fast Eating: Denied issues.   Nighttime Eating: None.    Factors Affecting Food Intake:  Knowledge and Beliefs: He presents with good adherence to nutrition recommendations. Food and nutrition related knowledge has improved with nutrition education and counseling. He has willingness to try new foods.  Stress/Anxiety: Did not report issues with stress/anxiety.   Sleep Patterns: Did not report issues with sleeping habits and patterns.  Food Safety and Access: He noted limited finances and resources.   Other: n/a    Food Intolerances/Dietary Restrictions:  No known food allergies or food intolerances.     Other GI Issues:  Denied issues    Hunger and Satiety:  Denied issues.       Allergies:  is allergic to enalapril.    Food Insecurity:  I'm going to read you two statements that people have made about their food situation. For each statement, please tell me whether the statement was often true, sometimes true, or never true for your household in the last month.      1. ???We worried whether our food would run out before we got money to buy more.???   Sometimes True     2. ???The food that we bought just didn't last, and we didn't have money to get more.  Often True       Relevant Labs:  All relevant labs were reviewed and discussed.    Relevant Medications/Supplements:  Reviewed current medications. Reviewed nutritionally relevant medications and supplements.  Current Outpatient Medications   Medication Sig Dispense Refill    allopurinol (ZYLOPRIM) 100 MG tablet Take 1 tablet (100 mg total) by mouth.      amlodipine (NORVASC) 10 MG tablet Take 1 tablet (10 mg total) by mouth daily.      ascorbic acid, vitamin C, (VITAMIN C) 1000 MG tablet Take 1 tablet (1,000 mg total) by mouth.      blood pressure test kit-large Kit 1 mL by Miscellaneous route once daily. 1 each 0    BYSTOLIC 20 mg Tab       dolutegravir-lamiVUDine ([name]) 50-300 mg Tab Take 1 tablet by mouth daily. 30 tablet 11    ibuprofen (ADVIL,MOTRIN) 200 MG tablet Take 2 tablets (400 mg total) by mouth.      rosuvastatin (CRESTOR) 10 MG tablet Take 1 tablet (10 mg total) by mouth in the morning.      sacubitriL-valsartan (ENTRESTO) 97-103 mg tablet Take 1 tablet by mouth two (2) times a day.      traZODone (DESYREL) 50 MG tablet Take 0.5 tablets (25 mg total) by mouth.       No current facility-administered medications for this visit.       Physical Activity:  Physical activity level is sedentary with little to no exercise.       Usual Daily Physical Activity Factors:   >/=1.0 to <1.4 (Sedentary)  Method for estimating physical activity factor: 2020-2025 DRI Guidelines      Estimated Adherence:   Client self-reported adherence score 9              Method for estimating adherence: Adult Meducation Readiness Ruler      NUTRITION DIAGNOSIS:   Inadequate  intake of fruits  Inadequate  intake of vegetables  Inadequate  intake of milk/milk products  Inadequate  intake of meat, poultry, fish, eggs, beans, nut products    Method for estimating nutritional adequacy: 2020-2025 DRI Guidelines     Clinical Diagnosis:   Altered nutrition-related laboratory values related to undesirable food choices as evidenced by most recent hbA1c% of 6.1% and self-reported >50% of intake from  fast-food.       NUTRITION INTERVENTIONS:      Nutrition Counseling   Nutrition Education   Care Coordination:Collaboration with other provider(s): ID CLINIC PROVIDER    Nutrition Goals:  Continue to reduce the consumption of sugar-sweetened beverages (juice, soda, sweet tea, gatorade) and replace with zero-calorie, diet, no sugar added choices and water with flavored packets.  Use healthier dining out options when eating out:  Whole-grain or whole wheat bread  Lean protein (chicken, tuna or [place]) with light cheese  Vegetable sources such as lettuce, pickles, [place] peppers  Healthier sides: pretzels or baked lays or fruit  Water as a beverage  Continue to increase home-cooked meals and use My plate guidelines when formulating plate or bowl:  1/2 plate vegetable  1/4 plate starchy or whole-grain  1/4 plate of lean protein source    4. Increase physical activity by walking, body-weight exercises 150 minutes per week.  5. Healthier snack ideas at the workplace:  -yogurt (no sugar added)  -fruit  -nuts  -triscuits with peanut butter, pimento cheese  -unsweetened apple sauce with string cheese  6. If replacing a meal with a protein shake, choose Glucerna or Premier Protein    Patient Education:  Individualized nutrition counseling and education provided on:   Freight forwarder Provided:  Patient instructions: nutrition-related goals  Handouts supporting dietary recommendations (sent via email/Epic/mail): n/a  Additional Materials:   Written nutrition tips       Client agreed to follow interventions:  Yes         NUTRITION MONITORING AND EVALUATION:  Follow-up appointment: in clinic in 6 weeks (01/12/2023 at 130pm)    Goals for next appointment:                                 1.Continue to reduce the consumption of sugar-sweetened beverages (juice, soda, sweet tea, gatorade) and replace with zero-calorie, diet, no sugar added choices and water with flavored packets.  Use healthier dining out options when eating out:  Whole-grain or whole wheat bread  Lean protein (chicken, tuna or [place]) with light cheese  Vegetable sources such as lettuce, pickles, [place] peppers  Healthier sides: pretzels or baked lays or fruit  Water as a beverage  Continue to increase home-cooked meals and use My plate guidelines when formulating plate or bowl:  1/2 plate vegetable  1/4 plate starchy or whole-grain  1/4 plate of lean protein source    4. Increase physical activity by walking, body-weight exercises 150 minutes per week.  5. Healthier snack ideas at the workplace:  -yogurt (no sugar added)  -fruit  -nuts  -triscuits with peanut butter, pimento cheese  -unsweetened apple sauce with string cheese  6. If replacing a meal with a protein shake, choose Glucerna or Premier Protein    Number of visits with RD: 3 of 6 visits      Length of visit was:  14 minutes      The patient reports they are physically located in [place] and is currently: at home. I conducted a phone visit.  I spent 14 minutes on the phone call with the patient on the date of service .        Signed:  Unk Pinto  12/02/2022 10:38 AM

## 2022-12-07 NOTE — Unmapped (Signed)
[facility] Refill Coordination Note    Specialty Medication(s) to be Shipped:   Infectious Disease: [name]    Other medication(s) to be shipped: No additional medications requested for fill at this time     Benjamin Ortiz, DOB: 1981/05/07  Phone: [phone] (home)       All above HIPAA information was verified with patient.     Was a Nurse, learning disability used for this call? No    Completed refill call assessment today to schedule patient's medication shipment from the [facility] [phone]).  All relevant notes have been reviewed.     Specialty medication(s) and dose(s) confirmed: Regimen is correct and unchanged.   Changes to medications: Benjamin Ortiz reports no changes at this time.  Changes to insurance: No  New side effects reported not previously addressed with a pharmacist or physician: None reported  Questions for the pharmacist: No    Confirmed patient received a Conservation officer, historic buildings and a Surveyor, mining with first shipment. The patient will receive a drug information handout for each medication shipped and additional FDA Medication Guides as required.       DISEASE/MEDICATION-SPECIFIC INFORMATION        N/A    SPECIALTY MEDICATION ADHERENCE     Medication Adherence    Patient reported X missed doses in the last month: 0  Specialty Medication: [name] 50-300 mg  Patient is on additional specialty medications: No              Were doses missed due to medication being on hold? No    [name] 50-300 mg: 7 days of medicine on hand        REFERRAL TO PHARMACIST     Referral to the pharmacist: Not needed      [facility]     Shipping address confirmed in Epic.     Patient was notified of new phone menu : No    Delivery Scheduled: Yes, Expected medication delivery date: 12/12/22.     Medication will be delivered via UPS to the prescription address in Epic WAM.    [name]   [facility] Specialty Technician

## 2022-12-09 MED FILL — DOVATO 50 MG-300 MG TABLET: ORAL | 30 days supply | Qty: 30 | Fill #5

## 2023-01-04 DIAGNOSIS — B20 Human immunodeficiency virus [HIV] disease: Principal | ICD-10-CM

## 2023-01-05 NOTE — Unmapped (Signed)
[facility] Refill Coordination Note    Specialty Medication(s) to be Shipped:   Infectious Disease: [name]    Other medication(s) to be shipped: No additional medications requested for fill at this time     Benjamin Ortiz, DOB: 1981-01-15  Phone: [phone] (home)       All above HIPAA information was verified with patient.     Was a Nurse, learning disability used for this call? No    Completed refill call assessment today to schedule patient's medication shipment from the [facility] [phone]).  All relevant notes have been reviewed.     Specialty medication(s) and dose(s) confirmed: Regimen is correct and unchanged.   Changes to medications: Benjamin Ortiz reports no changes at this time.  Changes to insurance: No  New side effects reported not previously addressed with a pharmacist or physician: None reported  Questions for the pharmacist: No    Confirmed patient received a Conservation officer, historic buildings and a Surveyor, mining with first shipment. The patient will receive a drug information handout for each medication shipped and additional FDA Medication Guides as required.       DISEASE/MEDICATION-SPECIFIC INFORMATION        N/A    SPECIALTY MEDICATION ADHERENCE     Medication Adherence    Patient reported X missed doses in the last month: 0  Specialty Medication: [name] 50-300mg   Patient is on additional specialty medications: No              Were doses missed due to medication being on hold? No    [name] 50-300 mg: 6 days of medicine on hand     REFERRAL TO PHARMACIST     Referral to the pharmacist: Not needed      [facility]     Shipping address confirmed in Epic.       Delivery Scheduled: Yes, Expected medication delivery date: 01/10/23.     Medication will be delivered via UPS to the prescription address in Epic WAM.    Tera Helper, [facility]   [facility] Specialty Pharmacist

## 2023-01-09 MED FILL — DOVATO 50 MG-300 MG TABLET: ORAL | 30 days supply | Qty: 30 | Fill #6

## 2023-01-09 NOTE — Unmapped (Signed)
Received patient income docs. Submitted application to [employer] Morrow for expedite.      RW B&C services ineligible, only Caps on Charges. Income is over 300% FPL (339%). Expires: 03/02/2022      Benjamin Ortiz  Benefits Counselor  Time of Intervention: 5 mins

## 2023-01-11 ENCOUNTER — Institutional Professional Consult (permissible substitution)
Admit: 2023-01-11 | Discharge: 2023-01-11 | Payer: PRIVATE HEALTH INSURANCE | Attending: Registered" | Primary: Registered"

## 2023-01-11 ENCOUNTER — Ambulatory Visit
Admit: 2023-01-11 | Discharge: 2023-01-11 | Payer: PRIVATE HEALTH INSURANCE | Attending: Student in an Organized Health Care Education/Training Program | Primary: Student in an Organized Health Care Education/Training Program

## 2023-01-11 DIAGNOSIS — Z23 Encounter for immunization: Principal | ICD-10-CM

## 2023-01-11 DIAGNOSIS — M5432 Sciatica, left side: Principal | ICD-10-CM

## 2023-01-11 DIAGNOSIS — R7303 Prediabetes: Principal | ICD-10-CM

## 2023-01-11 NOTE — Unmapped (Signed)
PROMIS Tablet Screening  Completed Date: 01/11/2023     SW reviewed self-administered screening.    Patient had a PHQ-9 score of 3  indicating None-Minimal depression.   Pt denies SI.   Pt scored 5 on AUDIT/AUDIT-C indicating Not-at-risk alcohol consumption  Pt  denies substance use in past 3 months.  Pt denies concerns for IPV.    Pt seen by provider for regular ID visit.  Pt was not seen in person by Social Work during this visit.        Benjamin Ortiz, [employer]

## 2023-01-11 NOTE — Unmapped (Signed)
Nutrition Goals:  Continue to reduce the consumption of sugar-sweetened beverages (juice, soda, sweet tea, gatorade) and replace with zero-calorie, diet, no sugar added choices and water with flavored packets.    Use healthier dining out options when eating out:  Whole-grain or whole wheat bread  Lean protein (chicken, tuna or [place]) with light cheese  Vegetable sources such as lettuce, pickles, [place] peppers  Healthier sides: pretzels or baked lays or fruit  Water as a beverage    Continue to increase home-cooked meals and use My plate guidelines when formulating plate or bowl:  1/2 plate vegetable  1/4 plate starchy or whole-grain  1/4 plate of lean protein source    4. Increase physical activity by walking, body-weight exercises 150 minutes per week.      5. Healthier snack ideas at the workplace:  -yogurt (no sugar added)- greek style  -fruit cup  -nuts/trail mix  -peanut butter with apple slices  -unsweetened apple sauce with string cheese      6. If replacing a meal with a protein shake, choose Glucerna or Premier Protein

## 2023-01-11 NOTE — Unmapped (Signed)
Started assessment with patient options: in clinic      Patient declined RW services at this time.     Housing Status  Armed forces operational officer    Reason for Declining: patient is not interested in RW this year. Stated of making over income. Has no trouble getting medications.        Madinah Cathcart-Rowe  Benefits Counselor  Time of Intervention: 5 mins

## 2023-01-11 NOTE — Unmapped (Signed)
Stretching to help sciatica pain -> search Doctor Alvino Chapel "back pain" or "sciatica" on youtube, can add real time if you want to do stretches in real time with her

## 2023-01-11 NOTE — Unmapped (Signed)
Infectious Diseases Clinic        Assessment/Plan:     HIV   -  Overall had been doing well. Current regimen: [name] (3TC/DTG). Switched from [place] due to cost. Was on Atripla prior to that, following dx in 2006.     Misses doses of ARVs never     Med access through insurance  CD4 count over 300 for >2Y on suppressive ART; no prophylaxis needed; recheck CD4 annually  Discussed ARV adherence, injectable ARVs and switching to [name]    Lab Results   Component Value Date    ACD4 1,269 05/19/2021    CD4TABS 960 05/16/2022    CD4 47 05/19/2021    CD4TCELL 40.0 05/16/2022    HIVRS Detected (A) 05/19/2021    HIVCP <40 05/16/2022       No labs today (labs drawn at [facility] earlier this week, have not yet returned)  Continue current therapy  Encouraged continued excellent ARV adherence    HTN  Now follows with [facility], still poorly controlled at today's visit despite reported adherence  - will message Dr Brooke Dare for follow up on that (last saw around 5 mo ago)      NICM  nonischemic dilated cardiomyopathy, coronary angiography on 06/30/2014 which revealed normal coronary arteries with ejection fraction of 35-40%, though recent echo 07/2020 with EF 62%, mild pulm regurg. EF now stable over last few years.  - per PCP/cards    Severe OSA  Followed by neurology at [facility]. Adherent to cpap historically    Hyperlipidemia  Followed by primary care [facility]) and [facility]. Prior TG 04/2021 were elevated, non-fasting. Were >700 back in 2021. Have encouraged f/u as above.  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 HRA 07/2019. Anal pap neg 04/2022  - anal pap on/after 04/2023    Mood/family challenges  His mother has progressive dementia now on hospice. Pt understandably struggling with this transition and decline, noting recent talks of whether to resuscitate her and he declined this so, citing he is in many ways starting to prep himself for end of her life. Offered reassurance/grief resources at today's visit.   - CTM    L Sciatica  Has come and gone for a couple years but worse since end of March 2024. Worse during work w/ a lot of activities like lifting, pushing carts. 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 Currently takes Goody's/Ibuprofen but not together.   - I recommended taking tylenol instead of alternating with ibuprofen for MAX of 3-4 days in a row  - We spent most time discussing how he could do stretching/PT w/ online videos (youtube, Doctor Alvino Chapel- PT).  - also will refer to PT around [facility]    Nicotine dependence  He reports that he quit smoking about 11 years ago. His smoking use included cigarettes. He has never used smokeless tobacco.      Sexual health & secondary prevention  Sex with cis men.    Denies new sexual partners, or recent sex   He does routinely discuss status with partner(s). Sometimes uses condoms.      Lab Results   Component Value Date    RPR Reactive (A) 05/19/2021    RPR 1:4 05/19/2021    CTNAA Negative 05/18/2022    CTNAA Negative 05/18/2022    CTNAA Negative 05/18/2022    GCNAA Negative 05/18/2022    GCNAA Negative  05/18/2022    GCNAA Negative 05/18/2022    SPECSOURCE Rectum 05/18/2022    SPECSOURCE Throat 05/18/2022    SPECSOURCE Urine 05/18/2022       GC/CT NAATs -- obtained today from all exposed anatomical site(s)  RPR -- for screening obtained today (ordered with [facility] labs, pending)    Health maintenance     Oral health  He does  have a dentist. Last dental exam was a few years ago at [facility]. Re-referred to [facility] 05/18/22, not discussed 12/2022    Eye health  He does not use corrective lenses. Last eye exam within last 12 months, through [facility]. Not discussed 12/2022    Metabolic conditions  Wt Readings from Last 5 Encounters:   01/11/23 95.6 kg (210 lb 12.8 oz)   08/25/22 97.7 kg (215 lb 6.4 oz)   05/18/22 98.2 kg (216 lb 6.4 oz)   05/19/21 95.8 kg (211 lb 3.2 oz)   06/02/20 95.5 kg (210 lb 9.6 oz)     Lab Results   Component Value Date    CREATININE 0.93 05/16/2022    PROTEINUA Trace (A) 05/18/2022    PROTEINUR 6 01/22/2013    GLUCOSEU Negative 05/18/2022    PCRATIOUR 0.075 01/22/2013    GLU 90 05/19/2021    A1C 6.1 (H) 08/25/2022    ALT 37 05/16/2022    ALT 29 05/19/2021    ALT 36 06/02/2020     # Kidney health -  labs ordered  (Cr w pending labs from [facility])  # Bone health -  needs at next  - needs at next visit  # Diabetes assessment - defer mgm't to PCP  # NAFLD assessment - suspicion for NAFLD low    Communicable diseases  Lab Results   Component Value Date    HEPBSAB Reactive (A) 05/19/2021    HEPCAB Nonreactive 05/19/2021     # TB screening - assessment needed but deferred to future visit  # Hepatitis screening - no indication for screening  # MMR screening - not assessed    Cancer screening  Lab Results   Component Value Date    FINALDX  05/18/2022     A. Anal pap:  - Negative for intraepithelial lesion or malignancy (NILM)    This electronic signature is attestation that the pathologist personally reviewed the submitted material(s) and the final diagnosis reflects that evaluation.       # Cervical -  n/a  # Breast - no indication for screening at present    # Anorectal - repeat anal Pap 13m from prior  # Colorectal - screening not indicated  # Liver - no screening indicated  # Lung - screening not indicated  # Prostate - screening not indicated    Cardiovascular disease  Lab Results   Component Value Date    CHOL 184 05/16/2022    HDL 28 (L) 05/16/2022    LDL 53 05/16/2022    NONHDL  05/19/2021      Comment:      Non-HDL Cholesterol Recommended Ranges (mg/dL)  Optimal      <841  Near Optimal130 - 159  Borderline High160 - 189  High            190 - 219  Very High      >220      TRIG 700 (HH) 05/16/2022     # The 10-year ASCVD risk score (Arnett DK, et al., 2019) is: 6.7%  - is taking aspirin   -  is taking statin  - BP control poor  - former smoker  # AAA screening - no indication for screening    Immunization History   Administered Date(s) Administered    COVID-19 VACC,MRNA,(PFIZER)(PF) 12/07/2019, 12/28/2019, 08/08/2020    Covid-19 Vac, (54yr+) (Spikevax) Monovalent Xbb.1.5 Moder  08/25/2022    HEPATITIS B VACCINE ADULT,IM(ENERGIX B, RECOMBIVAX) 04/08/2017, 06/09/2017, 01/16/2018    Hepatitis A (Adult) 04/24/2007, 04/08/2008    Hepatitis B Vaccine, Unspecified Formulation 07/07/1994, 08/18/1994, 01/12/1995    Influenza Vaccine Quad (IIV4 PF) 6-38mo 06/11/2022    Influenza Vaccine Quad(IM)6 MO-Adult(PF) 05/30/2019, 06/02/2020    Influenza Virus Vaccine, unspecified formulation 06/29/2015, 05/27/2019    PNEUMOCOCCAL POLYSACCHARIDE 23-VALENT 04/12/2005, 02/22/2011    PPD Test 04/08/2008, 06/30/2009, 10/06/2009    Pneumococcal Conjugate 13-Valent 02/04/2014    SHINGRIX-ZOSTER VACCINE (HZV),RECOMBINANT,ADJUVANTED(IM) 01/11/2023    SMALLPOX,MPOX(PF)(JYNNEOS) 05/19/2021    TD(TDVAX),ADSORBED,2LF(IM)(PF) 12/22/2015    TdaP 03/14/2006, 04/05/2016, 04/08/2017       Immunizations today -  zoster    I personally spent 35 minutes face-to-face and non-face-to-face in the care of this patient, which includes all pre, intra, and post visit time on the date of service.  All documented time was specific to the E/M visit and does not include any procedures that may have been performed.    Pt seen and evaluated with Dr. Cleon Dew.    Disposition    Return to clinic 5-6 months or sooner if needed.    To do @ next RTC  Sciatica   BP                Chief Complaint   Follow-up HIV      HPI  In addition to details in A&P above:    One sexual partner since last encounter, no recent sti testing. Doing fine on dovato. Main issue has been work stress and sx he attributes to both stress and elevated BP    HIV:  No missed doses   No hospitalizations      Past Medical History:   Diagnosis Date    Abnormal electrocardiogram 05/09/2014    HIV disease (CMS-HCC)     HTN (hypertension) 01/22/2013    Malignant hypertension 05/30/2014 Pain, dental 07/23/2013         Social History  Housing - in apt in [place] with his brother    School / Work - Not in school. Employed Public relations account executive)    Tobacco - He reports that he quit smoking about 11 years ago. His smoking use included cigarettes. He has never used smokeless tobacco.   Alcohol - special occassions, 3-4 glasses of wine on weekends    Substance use - none      Review of Systems  As per HPI. All others negative.      Medications and Allergies  He has a current medication list which includes the following prescription(s): allopurinol, amlodipine, ascorbic acid (vitamin c), bystolic, dovato, rosuvastatin, blood pressure test kit-large, ibuprofen, entresto, and trazodone.    Allergies: Enalapril      Family History  His family history includes Diabetes in his father; Heart disease in his father; Rheumatologic disease in his mother.                   BP 142/91 (BP Site: L Arm, BP Position: Sitting, BP Cuff Size: Medium)  - Pulse 61  - Temp 36.3 ??C (97.3 ??F) (Tympanic)  - Ht 182.9 cm (6')  - Wt 95.6 kg (210 lb 12.8 oz)  -  BMI 28.59 kg/m??      Const looks well and attentive, alert, appropriate   Eyes sclerae anicteric, noninjected OU   ENT dentition good, no thrush, leukoplakia or oral lesions and tongue piercing   Lymph no cervical  or supraclavicular LAD   CV RRR. No murmurs. No rub or gallop. S1/S2. No LEE   Resp CTAB ant/post, normal work of breathing   GI deferred   GU deferred   Rectal normal by inspection   Skin no petechiae, ecchymoses or obvious rashes on clothed exam   MSK no joint tenderness, normal ROM throughout, and pain on getting up to exam table, tenderness in hamstrings w/ FABER/FADIR, SLR + on L w/ pain radiating to L foot   Neuro CN II-XII grossly intact, MAEE, non focal   Psych Appropriate affect. Eye contact good. Linear thoughts. Fluent speech.

## 2023-01-11 NOTE — Unmapped (Signed)
[facility] Infectious Disease Clinic  Adult Medical Nutrition Therapy - Monitoring Assessment  Date: 01/11/2023    Patient Name:             Benjamin Ortiz    Date of Birth / Age / Birth Gender:            [birth year] / 42 y.o. / Male    Referring MD or Clinic:             Self, Referred    Reason for Referral:   General, healthful diet order yes: prediabetes    Interpreter:            No      NUTRITION ASSESSMENT     New developments since last nutrition appointment on 12/02/2022:  42 y.o. year old male seeking nutritional counseling for prediabetes.    Pt reports to nutrition follow-up visit with BMI of 28.59 kg/m2 (overweight) and a history of HTN, OSA, HLD, cardiomyopathy, dyspnea and CVD seeking nutritional counseling to mitigate elevated glucose levels.    Since follow-up nutrition visit, pt continues to improve dietary intake by choosing healthier dining out options and subsequently has lost 5 lbs since 08/2022. Pt attributes this weight loss to increasing physical activity and choosing more nutrient-dense foods.    Today, RD and pt discussed:  -increasing AM calories to help with reduction of night-time eating  -healthy snack choices at the workplace  -reading food labels  -healthy dining out  -my plate guidelines    PREDIABETES: Elevated and seems to be correlated with previous increased consumption of undesirable food choices such as added sugars, fried foods and processed carbohydrates.    Component  Ref Range & Units 08/25/22 1630 05/19/21 1643 12/06/11 1403     Hemoglobin A1C  4.8 - 5.6 % 6.1 High  5.6 5.4 R    Estimated Average Glucose  mg/dL [phone] R       DIET: For breakfast, pt will have a protein shake or applesauce/string cheese. For lunch, pt will have a fruit cup. For PM snack, pt will have popcorn or sun chips. For dinner, pt will have a vegetables stir-fry with chicken.     PHYSICAL ACTIVITY: Pt walks frequently at his workplace.    FOOD INSECURITY: Pt screens positive. Pt does not access a food pantry nor receives SNAP benefits. Pt inquired about SNAP benefits and will only be eligible for $16 per month. RD accessed internal medicine food pantry at today's visit.      Physical Findings Data:               BP Today: 142/91    Anthropometric Data:   Weight: 210 lbs clinic measure     BMI:   28.59 kg/m2      Weight Assessment:     Wt Readings from Last 5 Encounters:   01/11/23 95.6 kg (210 lb 12.8 oz)   08/25/22 97.7 kg (215 lb 6.4 oz)   05/18/22 98.2 kg (216 lb 6.4 oz)   05/19/21 95.8 kg (211 lb 3.2 oz)   06/02/20 95.5 kg (210 lb 9.6 oz)        Usual Daily Food Choices:    24-Hour Recall or Usual Intake:  Time Intake   Breakfast Apple sauce and string cheese  OR  Protein shake   Snack (AM)    Lunch Fruit cup with apples, pineapples and grapes   Snack (PM) Sun chips or [employer] with vegetables,  air-fried chicken   Snack (HS)        Behavioral Factors:  Overeating: Denied issues with overeating.   Emotional Eating: No issues noted.   Grazing: Denied issues.   Fast Eating: Denied issues.   Nighttime Eating: None.    Factors Affecting Food Intake:  Knowledge and Beliefs: He presents with good adherence to nutrition recommendations. Food and nutrition related knowledge has improved with nutrition education and counseling. He has willingness to try new foods.  Stress/Anxiety: Did not report issues with stress/anxiety.   Sleep Patterns: Did not report issues with sleeping habits and patterns.  Food Safety and Access: He noted limited finances and resources.   Other: n/a    Food Intolerances/Dietary Restrictions:  No known food allergies or food intolerances.     Other GI Issues:  Denied issues    Hunger and Satiety:  Denied issues.       Allergies:  is allergic to enalapril.    Food Insecurity:  I'm going to read you two statements that people have made about their food situation. For each statement, please tell me whether the statement was often true, sometimes true, or never true for your household in the last month.      1. ???We worried whether our food would run out before we got money to buy more.???   Sometimes True     2. ???The food that we bought just didn't last, and we didn't have money to get more.  Often True       Relevant Labs:  All relevant labs were reviewed and discussed.    Relevant Medications/Supplements:  Reviewed current medications. Reviewed nutritionally relevant medications and supplements.  Current Outpatient Medications   Medication Sig Dispense Refill    allopurinol (ZYLOPRIM) 100 MG tablet Take 1 tablet (100 mg total) by mouth.      amlodipine (NORVASC) 10 MG tablet Take 1 tablet (10 mg total) by mouth daily.      ascorbic acid, vitamin C, (VITAMIN C) 1000 MG tablet Take 1 tablet (1,000 mg total) by mouth.      blood pressure test kit-large Kit 1 mL by Miscellaneous route once daily. 1 each 0    BYSTOLIC 20 mg Tab       dolutegravir-lamiVUDine ([name]) 50-300 mg Tab Take 1 tablet by mouth daily. 30 tablet 11    ibuprofen (ADVIL,MOTRIN) 200 MG tablet Take 2 tablets (400 mg total) by mouth.      rosuvastatin (CRESTOR) 10 MG tablet Take 1 tablet (10 mg total) by mouth in the morning.      sacubitriL-valsartan (ENTRESTO) 97-103 mg tablet Take 1 tablet by mouth two (2) times a day.      traZODone (DESYREL) 50 MG tablet Take 0.5 tablets (25 mg total) by mouth.       No current facility-administered medications for this visit.       Physical Activity:  Physical activity level is sedentary with little to no exercise.       Usual Daily Physical Activity Factors:   >/=1.0 to <1.4 (Sedentary)  Method for estimating physical activity factor: 2020-2025 DRI Guidelines      Estimated Adherence:   Client self-reported adherence score 8              Method for estimating adherence: Adult Meducation Readiness Ruler      NUTRITION DIAGNOSIS:   Inadequate  intake of fruits  Inadequate  intake of vegetables  Inadequate  intake of milk/milk products  Inadequate  intake of meat, poultry, fish, eggs, beans, nut products    Method for estimating nutritional adequacy: 2020-2025 DRI Guidelines     Clinical Diagnosis:   Altered nutrition-related laboratory values related to undesirable food choices as evidenced by most recent hbA1c% of 6.1% and self-reported >50% of intake from fast-food.       NUTRITION INTERVENTIONS:      Nutrition Counseling   Nutrition Education   Care Coordination:Collaboration with other provider(s): ID CLINIC PROVIDER    Nutrition Goals:  Continue to reduce the consumption of sugar-sweetened beverages (juice, soda, sweet tea, gatorade) and replace with zero-calorie, diet, no sugar added choices and water with flavored packets.  Use healthier dining out options when eating out:  Whole-grain or whole wheat bread  Lean protein (chicken, tuna or [place]) with light cheese  Vegetable sources such as lettuce, pickles, [place] peppers  Healthier sides: pretzels or baked lays or fruit  Water as a beverage  Continue to increase home-cooked meals and use My plate guidelines when formulating plate or bowl:  1/2 plate vegetable  1/4 plate starchy or whole-grain  1/4 plate of lean protein source    4. Increase physical activity by walking, body-weight exercises 150 minutes per week.  5. Healthier snack ideas at the workplace:  -yogurt (no sugar added)- greek style  -fruit cup  -nuts/trail mix  -peanut butter with apple slices  -unsweetened apple sauce with string cheese  6. If replacing a meal with a protein shake, choose Glucerna or Premier Protein    Patient Education:  Individualized nutrition counseling and education provided on:   Healthier Dining Out Options  My Plate Guidelines  Balanced Snacks  Protein Intake       Materials Provided:  Patient instructions: nutrition-related goals  Handouts supporting dietary recommendations (sent via email/Epic/mail):   Reading Food Labels  Healthy Eating Out    Additional Materials:   Written nutrition tips       Client agreed to follow interventions:  Yes NUTRITION MONITORING AND EVALUATION:  Follow-up appointment: telephone/video  in 6 weeks 02/22/2023 at 1230pm    Goals for next appointment:     1.Continue to reduce the consumption of sugar-sweetened beverages (juice, soda, sweet tea, gatorade) and replace with zero-calorie, diet, no sugar added choices and water with flavored packets.  2.Use healthier dining out options when eating out:  Whole-grain or whole wheat bread  Lean protein (chicken, tuna or [place]) with light cheese  Vegetable sources such as lettuce, pickles, [place] peppers  Healthier sides: pretzels or baked lays or fruit  Water as a beverage  3.Continue to increase home-cooked meals and use My plate guidelines when formulating plate or bowl:  1/2 plate vegetable  1/4 plate starchy or whole-grain  1/4 plate of lean protein source    4. Increase physical activity by walking, body-weight exercises 150 minutes per week.  5. Healthier snack ideas at the workplace:  -yogurt (no sugar added)- greek style  -fruit cup  -nuts/trail mix  -peanut butter with apple slices  -unsweetened apple sauce with string cheese  6. If replacing a meal with a protein shake, choose Glucerna or Premier Protein        Number of visits with RD: 4 of 6 visits      Length of visit was:  15 minutes    Signed:  Unk Pinto  01/11/2023 1:38 PM

## 2023-01-14 LAB — BASIC METABOLIC PANEL
BLOOD UREA NITROGEN: 10 mg/dL (ref 6–24)
BUN / CREAT RATIO: 12 (ref 9–20)
CALCIUM: 9.3 mg/dL (ref 8.7–10.2)
CHLORIDE: 105 mmol/L (ref 96–106)
CO2: 23 mmol/L (ref 20–29)
CREATININE: 0.83 mg/dL (ref 0.76–1.27)
EGFR: 113 mL/min/{1.73_m2}
GLUCOSE: 86 mg/dL (ref 70–99)
POTASSIUM: 4.6 mmol/L (ref 3.5–5.2)
SODIUM: 141 mmol/L (ref 134–144)

## 2023-01-14 LAB — CBC W/ DIFFERENTIAL
BANDED NEUTROPHILS ABSOLUTE COUNT: 0 10*3/uL (ref 0.0–0.1)
BASOPHILS ABSOLUTE COUNT: 0 10*3/uL (ref 0.0–0.2)
BASOPHILS RELATIVE PERCENT: 1 %
EOSINOPHILS ABSOLUTE COUNT: 0.2 10*3/uL (ref 0.0–0.4)
EOSINOPHILS RELATIVE PERCENT: 3 %
HEMATOCRIT: 43.4 % (ref 37.5–51.0)
HEMOGLOBIN: 15 g/dL (ref 13.0–17.7)
IMMATURE GRANULOCYTES: 0 %
LYMPHOCYTES ABSOLUTE COUNT: 2.9 10*3/uL (ref 0.7–3.1)
LYMPHOCYTES RELATIVE PERCENT: 54 %
MEAN CORPUSCULAR HEMOGLOBIN CONC: 34.6 g/dL (ref 31.5–35.7)
MEAN CORPUSCULAR HEMOGLOBIN: 34.3 pg — ABNORMAL HIGH (ref 26.6–33.0)
MEAN CORPUSCULAR VOLUME: 99 fL — ABNORMAL HIGH (ref 79–97)
MONOCYTES ABSOLUTE COUNT: 0.5 10*3/uL (ref 0.1–0.9)
MONOCYTES RELATIVE PERCENT: 8 %
NEUTROPHILS ABSOLUTE COUNT: 1.9 10*3/uL (ref 1.4–7.0)
NEUTROPHILS RELATIVE PERCENT: 34 %
PLATELET COUNT: 124 10*3/uL — ABNORMAL LOW (ref 150–450)
RED BLOOD CELL COUNT: 4.37 x10E6/uL (ref 4.14–5.80)
RED CELL DISTRIBUTION WIDTH: 12.7 % (ref 11.6–15.4)
WHITE BLOOD CELL COUNT: 5.5 10*3/uL (ref 3.4–10.8)

## 2023-01-14 LAB — SYPHILIS SCREEN: RPR: REACTIVE — AB

## 2023-01-14 LAB — HIV RNA, QUANTITATIVE, PCR: HIV RNA: <20 {copies}/mL

## 2023-01-14 LAB — RPR QUANT AND TP ANTIBODIES
RPR, QUANT.: 1:2 {titer} — ABNORMAL HIGH
TREPONEMA PALLIDUM ANTIBODIES: REACTIVE — AB

## 2023-01-14 LAB — BILIRUBIN, TOTAL: BILIRUBIN TOTAL (MG/DL) IN SER/PLAS: 0.4 mg/dL (ref 0.0–1.2)

## 2023-01-14 LAB — AST: AST (SGOT): 29 IU/L (ref 0–40)

## 2023-01-14 LAB — VITAMIN D 25 HYDROXY: VITAMIN D 25-HYDROXY: 8.7 ng/mL — ABNORMAL LOW (ref 30.0–100.0)

## 2023-01-14 LAB — ALT: ALT (SGPT): 34 IU/L (ref 0–44)

## 2023-01-20 MED ORDER — CHOLECALCIFEROL (VITAMIN D3) 25 MCG (1,000 UNIT) CAPSULE
ORAL_CAPSULE | Freq: Every day | ORAL | 0 refills | 42 days | Status: CP
Start: 2023-01-20 — End: 2023-03-03

## 2023-01-24 ENCOUNTER — Ambulatory Visit: Payer: BC Managed Care – PPO

## 2023-02-01 ENCOUNTER — Ambulatory Visit: Payer: BC Managed Care – PPO | Attending: Internal Medicine

## 2023-02-01 DIAGNOSIS — G8929 Other chronic pain: Secondary | ICD-10-CM | POA: Diagnosis present

## 2023-02-01 DIAGNOSIS — M62830 Muscle spasm of back: Secondary | ICD-10-CM

## 2023-02-01 DIAGNOSIS — M5459 Other low back pain: Secondary | ICD-10-CM

## 2023-02-01 DIAGNOSIS — M5442 Lumbago with sciatica, left side: Secondary | ICD-10-CM | POA: Insufficient documentation

## 2023-02-01 DIAGNOSIS — M6281 Muscle weakness (generalized): Secondary | ICD-10-CM

## 2023-02-01 NOTE — Therapy (Signed)
OUTPATIENT PHYSICAL THERAPY THORACOLUMBAR EVALUATION   Patient Name: Justin Cox MRN: [id number] DOB:06-03-1981, 42 y.o., male Today's Date: 02/01/2023  END OF SESSION:  PT End of Session - 02/01/23 1403     Visit Number 1    Date for PT Re-Evaluation 04/26/23    Authorization Type BCBS    PT Start Time 1400    PT Stop Time 1445    PT Time Calculation (min) 45 min    Activity Tolerance Patient tolerated treatment well    Behavior During Therapy WFL for tasks assessed/performed             Past Medical History:  Diagnosis Date   Asthma    CHF (congestive heart failure) (HCC)    Headache    High cholesterol    HIV infection (HCC)    Hypertension    TIA (transient ischemic attack)    Umbilical hernia    Past Surgical History:  Procedure Laterality Date   INSERTION OF MESH N/A 08/30/2018   Procedure: INSERTION OF MESH;  Surgeon: Manus Rudd, MD;  Location: [facility] OR;  Service: General;  Laterality: N/A;   LEFT HEART CATHETERIZATION WITH CORONARY ANGIOGRAM N/A 06/30/2014   Procedure: LEFT HEART CATHETERIZATION WITH CORONARY ANGIOGRAM;  Surgeon: [name], MD;  Location: [facility] CATH LAB;  Service: Cardiovascular;  Laterality: N/A;   UMBILICAL HERNIA REPAIR N/A 08/30/2018   Procedure: HERNIA REPAIR UMBILICAL ADULT ERAS PATHWAY;  Surgeon: Manus Rudd, MD;  Location: [facility] OR;  Service: General;  Laterality: N/A;   Patient Active Problem List   Diagnosis Date Noted   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   Heart palpitations 09/03/2020   Insomnia 09/03/2020   Snoring 09/03/2020   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   PVC's (premature ventricular contractions) 01/06/2016   OSA (obstructive sleep apnea) 09/02/2014   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   HTN (hypertension), malignant 05/30/2014   HLD (hyperlipidemia) 05/30/2014   Nonischemic cardiomyopathy (HCC) 05/30/2014   Abnormal echocardiogram 05/09/2014   Chest pain 05/09/2014   DOE (dyspnea on exertion) 05/09/2014    Abnormal EKG 05/09/2014   Hyperlipidemia 04/10/2014   HIV infection (HCC) 04/10/2014   Hypertensive urgency 04/10/2014   TIA (transient ischemic attack) 04/10/2014    PCP: Georgianne Fick  REFERRING PROVIDER: Mel Almond  REFERRING DIAG: sciatica, left side  Rationale for Evaluation and Treatment: Rehabilitation  THERAPY DIAG:  Chronic left-sided low back pain with left-sided sciatica  ONSET DATE: 01/11/23  SUBJECTIVE:  SUBJECTIVE STATEMENT: Pretty good so far today. The back pain is like a pulling down the left side and I get numbness down my left leg. It has been going on and off for about 6 months and has gotten worse.   PERTINENT HISTORY:  Asthma, CHF, HIV infection  PAIN:  Are you having pain? Yes: NPRS scale: 10/10 Pain location: Left side and goes down left leg to ankle Pain description: pulling, pressure, N/T Aggravating factors: heavy lifting at work  Relieving factors: lay down and stretch  PRECAUTIONS: None  WEIGHT BEARING RESTRICTIONS: No  FALLS:  Has patient fallen in last 6 months? 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   PLOF: Independent  PATIENT GOALS: walk normal and do things like I used to    OBJECTIVE:   DIAGNOSTIC FINDINGS:  None  SCREENING FOR RED FLAGS: Bowel or bladder incontinence: No Spinal tumors: No Cauda equina syndrome: No Compression fracture: No Abdominal aneurysm: No  COGNITION: Overall cognitive status: Within functional limits for tasks assessed     SENSATION: WFL  MUSCLE LENGTH: Hamstrings: Right mild tightness ; Left moderate tightness    POSTURE: No  Significant postural limitations  PALPATION: TTP L4-L5 and with pressure on sacrum   LUMBAR ROM:   AROM eval  Flexion Can touch toes pain when reaching  Extension WFL  Right lateral flexion WFL can feel the pull  Left lateral flexion WFL  Right rotation WFL  Left rotation 75% with pain   (Blank rows = not tested)  LOWER EXTREMITY ROM:  grossly WFL    LOWER EXTREMITY MMT:  grossly 5/5   LUMBAR SPECIAL TESTS:  Straight leg raise test: Positive and FABER test: Negative  FUNCTIONAL TESTS:  5 times sit to stand: 17.15s w/pain   TODAY'S TREATMENT:                                                                                                                              DATE: 02/01/23- EVAL     PATIENT EDUCATION:  Education details: POC and HEP, piriformis syndrome Person educated: Patient Education method: Explanation Education comprehension: verbalized understanding  HOME EXERCISE PROGRAM: Access Code: [email or web address] URL: [email or web address] Date: 02/01/2023 Prepared by: Cassie Freer  Exercises - Supine Bridge  - 1 x daily - 7 x weekly - 2 sets - 10 reps - Supine Lower Trunk Rotation  - 1 x daily - 7 x weekly - 2 sets - 10 reps - Supine Piriformis Stretch with Foot on Ground  - 1 x daily - 7 x weekly - 2 reps - 30 hold - Seated Hamstring Stretch  - 1 x daily - 7 x weekly - 2 reps - 30 hold - Seated Piriformis Stretch with Trunk Bend  - 1 x daily - 7 x weekly - 2  reps - 30 hold  ASSESSMENT:  CLINICAL IMPRESSION: Patient is a 42 y.o. male who was seen today for physical therapy evaluation and treatment for low back pain with left sided sciatica. He reports his pain to be mostly left sided with numbness and tingling down his LLE. He reports his pain level at a 10/10 today. He has some mobility impairments and tightness especially in his piriformis muscle. His s/sx are consistent with possible piriformis syndrome. He will benefit from modalities such as  traction, DN, e-stim, heat and STM in addition to therex to help alleviate his pain and N/T.    REHAB POTENTIAL: Good  CLINICAL DECISION MAKING: Stable/uncomplicated  EVALUATION COMPLEXITY: Low  GOALS: Goals reviewed with patient? Yes  SHORT TERM GOALS: Target date: 03/15/23  Patient will be independent with initial HEP.  Goal status: INITIAL  2.  Patient will report centralization of radicular symptoms.  Baseline: N/T in LLE Goal status: INITIAL   LONG TERM GOALS: Target date: 04/26/23  Patient will be independent with advanced/ongoing HEP to improve outcomes and carryover.  Goal status: INITIAL  2.  Patient will report 75% improvement in low back pain to improve QOL.  Baseline: 10/10 Goal status: INITIAL  3.  Patient will demonstrate full pain free lumbar ROM to perform ADLs.   Baseline: see chart above Goal status: INITIAL  4.  Patient will demonstrate improved functional strength as demonstrated by 5xSTS <12s no pain. Baseline: 17.15s with pain Goal status: INITIAL  PLAN:  PT FREQUENCY: 1-2x/week  PT DURATION: 12 weeks  PLANNED INTERVENTIONS: Therapeutic exercises, Therapeutic activity, Neuromuscular re-education, Balance training, Gait training, Patient/Family education, Self Care, Joint mobilization, Dry Needling, Electrical stimulation, Spinal manipulation, Spinal mobilization, Cryotherapy, Moist heat, Traction, Ionotophoresis 4mg /ml Dexamethasone, and Manual therapy.  PLAN FOR NEXT SESSION: traction, low back stretching and strengthening, piriformis STM, sciatic nerve glides   Cassie Freer, PT 02/01/2023, 2:42 PM

## 2023-02-03 NOTE — Unmapped (Signed)
[facility] Specialty Pharmacy Refill Coordination Note    Specialty Medication(s) to be Shipped:   Infectious Disease: [name]    Other medication(s) to be shipped: No additional medications requested for fill at this time     Benjamin Ortiz, DOB: Mar 31, 1981  Phone: [phone] (home)       All above HIPAA information was verified with patient.     Was a Nurse, learning disability used for this call? No    Completed refill call assessment today to schedule patient's medication shipment from the [facility] [phone]).  All relevant notes have been reviewed.     Specialty medication(s) and dose(s) confirmed: Regimen is correct and unchanged.   Changes to medications: Tejveer reports no changes at this time.  Changes to insurance: No  New side effects reported not previously addressed with a pharmacist or physician: None reported  Questions for the pharmacist: No    Confirmed patient received a Conservation officer, historic buildings and a Surveyor, mining with first shipment. The patient will receive a drug information handout for each medication shipped and additional FDA Medication Guides as required.       DISEASE/MEDICATION-SPECIFIC INFORMATION        N/A    SPECIALTY MEDICATION ADHERENCE     Medication Adherence    Patient reported X missed doses in the last month: 0  Specialty Medication: [name] 50-300 mg Tab (dolutegravir-lamiVUDine)  Patient is on additional specialty medications: No  Patient is on more than two specialty medications: No  Any gaps in refill history greater than 2 weeks in the last 3 months: no  Demonstrates understanding of importance of adherence: yes  Informant: patient  Confirmed plan for next specialty medication refill: delivery by pharmacy  Refills needed for supportive medications: not needed          Refill Coordination    Has the Patients' Contact Information Changed: No  Is the Shipping Address Different: No         Were doses missed due to medication being on hold? No    [name] 50-300 mg: 7 days of medicine on hand       REFERRAL TO PHARMACIST     Referral to the pharmacist: Not needed      [facility]     Shipping address confirmed in Epic.       Delivery Scheduled: Yes, Expected medication delivery date: 02/07/2023.     Medication will be delivered via UPS to the prescription address in Epic WAM.    Kerby Less   [facility] Specialty Technician

## 2023-02-06 MED FILL — DOVATO 50 MG-300 MG TABLET: ORAL | 30 days supply | Qty: 30 | Fill #7

## 2023-02-08 ENCOUNTER — Ambulatory Visit: Payer: BC Managed Care – PPO

## 2023-02-15 ENCOUNTER — Ambulatory Visit: Payer: BC Managed Care – PPO

## 2023-02-22 ENCOUNTER — Ambulatory Visit: Payer: BC Managed Care – PPO

## 2023-03-01 ENCOUNTER — Ambulatory Visit: Payer: BC Managed Care – PPO

## 2023-03-01 NOTE — Unmapped (Signed)
[facility] Specialty Pharmacy Refill Coordination Note    Specialty Medication(s) to be Shipped:   Infectious Disease: [name]    Other medication(s) to be shipped: No additional medications requested for fill at this time     Benjamin Ortiz, DOB: 1981-02-05  Phone: [phone] (home)       All above HIPAA information was verified with patient.     Was a Nurse, learning disability used for this call? No    Completed refill call assessment today to schedule patient's medication shipment from the [facility] [phone]).  All relevant notes have been reviewed.     Specialty medication(s) and dose(s) confirmed: Regimen is correct and unchanged.   Changes to medications: [name] reports no changes at this time.  Changes to insurance: No  New side effects reported not previously addressed with a pharmacist or physician: None reported  Questions for the pharmacist: No    Confirmed patient received a Conservation officer, historic buildings and a Surveyor, mining with first shipment. The patient will receive a drug information handout for each medication shipped and additional FDA Medication Guides as required.       DISEASE/MEDICATION-SPECIFIC INFORMATION        N/A    SPECIALTY MEDICATION ADHERENCE     Medication Adherence    Patient reported X missed doses in the last month: 0  Specialty Medication: [name] 50-300mg  QD  Patient is on additional specialty medications: No  Patient is on more than two specialty medications: No  Informant: patient              Were doses missed due to medication being on hold? No    [name] 50-300 mg: 6 days of medicine on hand       REFERRAL TO PHARMACIST     Referral to the pharmacist: Not needed      [facility]     Shipping address confirmed in Epic.       Delivery Scheduled: Yes, Expected medication delivery date: 03/03/23.     Medication will be delivered via UPS to the prescription address in Epic WAM.    Darryl Nestle, PharmD   [facility] Specialty Pharmacist

## 2023-03-02 MED FILL — DOVATO 50 MG-300 MG TABLET: ORAL | 30 days supply | Qty: 30 | Fill #8

## 2023-03-14 ENCOUNTER — Other Ambulatory Visit: Payer: Self-pay | Admitting: Cardiology

## 2023-03-14 DIAGNOSIS — E782 Mixed hyperlipidemia: Secondary | ICD-10-CM

## 2023-04-06 NOTE — Unmapped (Signed)
[facility] Specialty Pharmacy Clinical Assessment & Refill Coordination Note    Benjamin Ortiz, DOB: Jul 21, 1981  Phone: [phone] (home)     All above HIPAA information was verified with patient.     Was a Nurse, learning disability used for this call? No    Specialty Medication(s):   Infectious Disease: [name]     Current Outpatient Medications   Medication Sig Dispense Refill    allopurinol (ZYLOPRIM) 100 MG tablet Take 1 tablet (100 mg total) by mouth.      amlodipine (NORVASC) 10 MG tablet Take 1 tablet (10 mg total) by mouth daily.      ascorbic acid, vitamin C, (VITAMIN C) 1000 MG tablet Take 1 tablet (1,000 mg total) by mouth.      blood pressure test kit-large Kit 1 mL by Miscellaneous route once daily. 1 each 0    BYSTOLIC 20 mg Tab       cholecalciferol, vitamin D3-25 mcg, 1,000 unit,, 25 mcg (1,000 unit) capsule Take 1 capsule (25 mcg total) by mouth daily. After completing 6-week lead-in ( , two pills daily), transition to daily dosing of . 30 capsule 5    dolutegravir-lamiVUDine ([name]) 50-300 mg Tab Take 1 tablet by mouth daily. 30 tablet 11    ibuprofen (ADVIL,MOTRIN) 200 MG tablet Take 2 tablets (400 mg total) by mouth.      rosuvastatin (CRESTOR) 10 MG tablet Take 1 tablet (10 mg total) by mouth in the morning.      sacubitriL-valsartan (ENTRESTO) 97-103 mg tablet Take 1 tablet by mouth two (2) times a day.      traZODone (DESYREL) 50 MG tablet Take 0.5 tablets (25 mg total) by mouth.       No current facility-administered medications for this visit.        Changes to medications: Christop reports no changes at this time.    Allergies   Allergen Reactions    Enalapril Cough       Changes to allergies: No    SPECIALTY MEDICATION ADHERENCE     [name] 50-300 mg: 6 days of medicine on hand     Medication Adherence    Patient reported X missed doses in the last month: 0  Specialty Medication: dolutegravir-lamiVUDine: [name] 50-300 mg Tab  Patient is on additional specialty medications: No  Any gaps in refill history greater than 2 weeks in the last 3 months: no  Demonstrates understanding of importance of adherence: yes  Informant: patient  Provider-estimated medication adherence level: good  Patient is at risk for Non-Adherence: No          Specialty medication(s) dose(s) confirmed: Regimen is correct and unchanged.     Are there any concerns with adherence? No    Adherence counseling provided? Not needed    CLINICAL MANAGEMENT AND INTERVENTION      Clinical Benefit Assessment:    Do you feel the medicine is effective or helping your condition? Yes    HIV ASSOCIATED LABS:     Lab Results   Component Value Date/Time    HIVRS Detected (A) 05/19/2021 04:43 PM    HIVRS Not Detected 06/02/2020 11:03 AM    HIVRS Not Detected 06/11/2019 11:45 AM    HIVRS Not Detected 08/19/2014 12:24 PM    HIVRS Not Detected 02/04/2014 11:08 AM    HIVRS Detected 01/22/2013 02:27 PM    HIVCP <20 01/10/2023 11:09 AM    HIVCP <40 05/16/2022 10:30 AM    HIVCP <40 (H) 05/19/2021 04:43 PM  HIVCP <40 12/18/2019 12:01 PM    ACD4 1,269 05/19/2021 04:43 PM    ACD4 980 06/02/2020 11:03 AM    ACD4 1,149 06/11/2019 11:45 AM    ACD4 860 08/19/2014 12:24 PM    ACD4 802 02/04/2014 11:08 AM    ACD4 1,143 01/22/2013 02:27 PM       Clinical Benefit counseling provided? Labs from 01/10/23 show evidence of clinical benefit    Adverse Effects Assessment:    Are you experiencing any side effects? No    Are you experiencing difficulty administering your medicine? No    Quality of Life Assessment:    How many days over the past month did your HIV  keep you from your normal activities? For example, brushing your teeth or getting up in the morning. 0    Have you discussed this with your provider? Not needed    Acute Infection Status:    Acute infections noted within Epic:  No active infections  Patient reported infection: None    Therapy Appropriateness:    Is therapy appropriate and patient progressing towards therapeutic goals? Yes, therapy is appropriate and should be continued    DISEASE/MEDICATION-SPECIFIC INFORMATION      N/A    HIV: Not Applicable    PATIENT SPECIFIC NEEDS     Does the patient have any physical, cognitive, or cultural barriers? No    Is the patient high risk? No    Did the patient require a clinical intervention? No    Does the patient require physician intervention or other additional services (i.e., nutrition, smoking cessation, social work)? No    SOCIAL DETERMINANTS OF HEALTH     At the [facility], we have learned that life circumstances - like trouble affording food, housing, utilities, or transportation can affect the health of many of our patients.   That is why we wanted to ask: are you currently experiencing any life circumstances that are negatively impacting your health and/or quality of life? Patient declined to answer    Social Determinants of Health     Financial Resource Strain: Not on file   Internet Connectivity: Not on file   Food Insecurity: Not on file   Tobacco Use: Medium Risk (08/25/2022)    Patient History     Smoking Tobacco Use: Former     Smokeless Tobacco Use: Never     Passive Exposure: Not on file   Housing/Utilities: Not on file   Alcohol Use: Not on file   Transportation Needs: Not on file   Substance Use: Not on file   Health Literacy: Not on file   Physical Activity: Not on file   Interpersonal Safety: Unknown (04/06/2023)    Interpersonal Safety     Unsafe Where You Currently Live: Not on file     Physically Hurt by Anyone: Not on file     Abused by Anyone: Not on file   Stress: Not on file   Intimate Partner Violence: Not on file   Depression: Not at risk (08/25/2022)    PHQ-2     PHQ-2 Score: 0   Social Connections: Not on file       Would you be willing to receive help with any of the needs that you have identified today? Not applicable       SHIPPING     Specialty Medication(s) to be Shipped:   Infectious Disease: [name]    Other medication(s) to be shipped: No additional medications requested for fill at this time  Changes to insurance: No    Delivery Scheduled: Yes, Expected medication delivery date: 04/10/23.     Medication will be delivered via UPS to the confirmed prescription address in [facility].    The patient will receive a drug information handout for each medication shipped and additional FDA Medication Guides as required.  Verified that patient has previously received a Conservation officer, historic buildings and a Surveyor, mining.    The patient or caregiver noted above participated in the development of this care plan and knows that they can request review of or adjustments to the care plan at any time.      All of the patient's questions and concerns have been addressed.    Roderic Palau, PharmD   [facility] Specialty Pharmacist

## 2023-04-07 MED FILL — DOVATO 50 MG-300 MG TABLET: ORAL | 30 days supply | Qty: 30 | Fill #9

## 2023-05-01 NOTE — Unmapped (Signed)
[facility] Specialty Pharmacy Refill Coordination Note    Specialty Medication(s) to be Shipped:   Infectious Disease: [name]    Other medication(s) to be shipped: No additional medications requested for fill at this time     Benjamin Ortiz, DOB: 1981/06/16  Phone: [phone] (home)       All above HIPAA information was verified with patient.     Was a Nurse, learning disability used for this call? No    Completed refill call assessment today to schedule patient's medication shipment from the [facility] [phone]).  All relevant notes have been reviewed.     Specialty medication(s) and dose(s) confirmed: Regimen is correct and unchanged.   Changes to medications: Benjamin Ortiz reports no changes at this time.  Changes to insurance: No  New side effects reported not previously addressed with a pharmacist or physician: None reported  Questions for the pharmacist: No    Confirmed patient received a Conservation officer, historic buildings and a Surveyor, mining with first shipment. The patient will receive a drug information handout for each medication shipped and additional FDA Medication Guides as required.       DISEASE/MEDICATION-SPECIFIC INFORMATION        N/A    SPECIALTY MEDICATION ADHERENCE     Medication Adherence    Patient reported X missed doses in the last month: 0  Specialty Medication: [name] 50-300 mg Tab (dolutegravir-lamiVUDine)  Patient is on additional specialty medications: No  Patient is on more than two specialty medications: No  Any gaps in refill history greater than 2 weeks in the last 3 months: no  Demonstrates understanding of importance of adherence: yes  Informant: patient  Confirmed plan for next specialty medication refill: delivery by pharmacy  Refills needed for supportive medications: not needed          Refill Coordination    Has the Patients' Contact Information Changed: No  Is the Shipping Address Different: No         Were doses missed due to medication being on hold? No    [name] 50-300 mg: 7 days of medicine on hand       REFERRAL TO PHARMACIST     Referral to the pharmacist: Not needed      [facility]     Shipping address confirmed in Epic.       Delivery Scheduled: Yes, Expected medication delivery date: 05/04/2023.     Medication will be delivered via UPS to the prescription address in Epic WAM.    Benjamin Ortiz   [facility] Specialty Technician

## 2023-05-03 MED FILL — DOVATO 50 MG-300 MG TABLET: ORAL | 30 days supply | Qty: 30 | Fill #10

## 2023-06-07 DIAGNOSIS — B20 Human immunodeficiency virus [HIV] disease: Principal | ICD-10-CM

## 2023-06-07 MED ORDER — DOVATO 50 MG-300 MG TABLET
ORAL_TABLET | Freq: Every day | ORAL | 0 refills | 30 days | Status: CP
Start: 2023-06-07 — End: ?
  Filled 2023-06-12: qty 30, 30d supply, fill #0

## 2023-06-07 NOTE — Unmapped (Signed)
[facility] Delivery Pharmacy Refill Coordination Note    Specialty Medication(s) to be Shipped:   Infectious Disease: [name]    Other medication(s) to be shipped: No additional medications requested for fill at this time     Benjamin Ortiz, DOB: 11-19-1980  Phone: [phone] (home)       All above HIPAA information was verified with patient.     Was a Nurse, learning disability used for this call? No    Completed refill call assessment today to schedule patient's medication shipment from the [facility]  [phone]).  All relevant notes have been reviewed.     Specialty medication(s) and dose(s) confirmed: Regimen is correct and unchanged.   Changes to medications: Nasif reports no changes at this time.  Changes to insurance: No  New side effects reported not previously addressed with a pharmacist or physician: None reported  Questions for the pharmacist: No    Confirmed patient received a Conservation officer, historic buildings and a Surveyor, mining with first shipment. The patient will receive a drug information handout for each medication shipped and additional FDA Medication Guides as required.       DISEASE/MEDICATION-SPECIFIC INFORMATION        N/A    SPECIALTY MEDICATION ADHERENCE     Medication Adherence    Patient reported X missed doses in the last month: 0  Specialty Medication: [name] 50-300 mg Tab (dolutegravir-lamiVUDine)  Patient is on additional specialty medications: No  Patient is on more than two specialty medications: No  Any gaps in refill history greater than 2 weeks in the last 3 months: no  Demonstrates understanding of importance of adherence: yes  Informant: patient  Confirmed plan for next specialty medication refill: delivery by pharmacy  Refills needed for supportive medications: yes, ordered or provider notified          Refill Coordination    Has the Patients' Contact Information Changed: No  Is the Shipping Address Different: No         Were doses missed due to medication being on hold? No    [name] 50-300   mg: 5 days of medicine on hand       REFERRAL TO PHARMACIST     Referral to the pharmacist: Not needed      [facility]     Shipping address confirmed in Epic.       Delivery Scheduled: Yes, Expected medication delivery date: 06/13/23.  However, Rx request for refills was sent to the provider as there are none remaining.     Medication will be delivered via UPS to the prescription address in Epic WAM.    Kerby Less   [facility]  Specialty Technician

## 2023-06-07 NOTE — Unmapped (Signed)
Medication Requested: [name] 50-300 mg tab    Last visit: 01/11/2023    Future Appointments   Date Time Provider Department Center   06/14/2023  1:30 PM Leveda Anna, MD [facility]     Per Provider Note:   Current regimen: [name] (3TC/DTG).   Standing order protocol requirements met?: Yes    Sent to: Pharmacy per protocol    Days Supply Given: 30 days  Number of Refills: 0

## 2023-06-19 ENCOUNTER — Other Ambulatory Visit: Payer: Self-pay | Admitting: Cardiology

## 2023-06-19 DIAGNOSIS — E782 Mixed hyperlipidemia: Secondary | ICD-10-CM

## 2023-07-11 DIAGNOSIS — B20 Human immunodeficiency virus [HIV] disease: Principal | ICD-10-CM

## 2023-07-11 MED ORDER — DOVATO 50 MG-300 MG TABLET
ORAL_TABLET | Freq: Every day | ORAL | 1 refills | 30 days | Status: CP
Start: 2023-07-11 — End: ?
  Filled 2023-07-13: qty 30, 30d supply, fill #0

## 2023-07-11 NOTE — Unmapped (Signed)
Medication Requested: [name]       Future Appointments   Date Time Provider Department Center   08/30/2023  3:00 PM Juel Burrow Theresia Lo, MD [facility]     Per Provider Note: Current regimen: [name] (3TC/DTG).     Standing order protocol requirements met?: Yes    Sent to: Pharmacy per protocol    Days Supply Given: 30 days  Number of Refills: 1

## 2023-07-11 NOTE — Unmapped (Signed)
[facility] Pharm[facility] Refill Coordination Note    Specialty Medication(s) to be Shipped:   Infectious Disease: [name]    Other medication(s) to be shipped: No additional medications requested for fill at this time     Benjamin Ortiz, DOB: 22-Apr-1981  Phone: [phone] (home)       All above HIPAA information was verified with patient.     Was a Nurse, learning disability used for this call? No    Completed refill call assessment today to schedule patient's medication shipment from the [facility]  [phone]).  All relevant notes have been reviewed.     Specialty medication(s) and dose(s) confirmed: Regimen is correct and unchanged.   Changes to medications: Azar reports no changes at this time.  Changes to insurance: No  New side effects reported not previously addressed with a pharmacist or physician: None reported  Questions for the pharmacist: No    Confirmed patient received a Conservation officer, historic buildings and a Surveyor, mining with first shipment. The patient will receive a drug information handout for each medication shipped and additional FDA Medication Guides as required.       DISEASE/MEDICATION-SPECIFIC INFORMATION        N/A    SPECIALTY MEDICATION ADHERENCE     Medication Adherence    Patient reported X missed doses in the last month: 0  Specialty Medication: [name] 50-300 mg Tab (dolutegravir-lamiVUDine)  Patient is on additional specialty medications: No  Patient is on more than two specialty medications: No  Any gaps in refill history greater than 2 weeks in the last 3 months: no  Demonstrates understanding of importance of adherence: yes  Informant: patient  Confirmed plan for next specialty medication refill: delivery by pharmacy  Refills needed for supportive medications: yes, ordered or provider notified          Refill Coordination    Has the Patients' Contact Information Changed: No  Is the Shipping Address Different: No         Were doses missed due to medication being on hold? No    [name] 50-300   mg: 5 days of medicine on hand       REFERRAL TO PHARMACIST     Referral to the pharmacist: Not needed      [facility]     Shipping address confirmed in Epic.       Delivery Scheduled: Yes, Expected medication delivery date: 07/14/23.  However, Rx request for refills was sent to the provider as there are none remaining.     Medication will be delivered via UPS to the prescription address in Epic WAM.    Kerby Less   [facility]  Specialty Technician

## 2023-07-31 ENCOUNTER — Ambulatory Visit
Admission: EM | Admit: 2023-07-31 | Discharge: 2023-07-31 | Disposition: A | Discharge status: Home / Self Care | Payer: BC Managed Care – PPO | Attending: Internal Medicine | Admitting: Internal Medicine

## 2023-07-31 ENCOUNTER — Other Ambulatory Visit: Payer: Self-pay | Admitting: Cardiology

## 2023-07-31 DIAGNOSIS — G8929 Other chronic pain: Secondary | ICD-10-CM | POA: Diagnosis not present

## 2023-07-31 DIAGNOSIS — M5442 Lumbago with sciatica, left side: Secondary | ICD-10-CM

## 2023-07-31 DIAGNOSIS — M5441 Lumbago with sciatica, right side: Secondary | ICD-10-CM

## 2023-07-31 DIAGNOSIS — I110 Hypertensive heart disease with heart failure: Secondary | ICD-10-CM

## 2023-07-31 MED ORDER — CYCLOBENZAPRINE HCL 10 MG PO TABS
10.0000 mg | ORAL_TABLET | Freq: Two times a day (BID) | ORAL | 0 refills | Status: DC | PRN
Start: 2023-07-31 — End: 2023-12-15

## 2023-07-31 MED ORDER — NAPROXEN 375 MG PO TABS
375.0000 mg | ORAL_TABLET | Freq: Two times a day (BID) | ORAL | 0 refills | Status: AC | PRN
Start: 2023-08-01 — End: ?

## 2023-07-31 MED ORDER — KETOROLAC TROMETHAMINE 30 MG/ML IJ SOLN
30.0000 mg | Freq: Once | INTRAMUSCULAR | Status: AC
  Administered 2023-07-31: 30 mg via INTRAMUSCULAR

## 2023-07-31 NOTE — ED Triage Notes (Signed)
Pt presents with c/o lower back pain, states he has trouble standing straight since this morning.   Pt states he felt fatigued this morning.

## 2023-07-31 NOTE — Discharge Instructions (Signed)
You were given a Toradol injection in clinic today. Do not take any over the counter NSAID's such as Advil, ibuprofen, Aleve, or naproxen for 24 hours. You may take tylenol if needed.  You may take Flexeril as needed.  Please of this medication can make you drowsy.  Do not drink alcohol or drive on this medication.  Restart naproxen twice daily as needed for your back pain tomorrow, 11/12.  Heat and rest to the back.  Please follow-up with your PCP if your symptoms do not improve.  Please go to the ER for any worsening symptoms.  I hope you feel better soon!

## 2023-07-31 NOTE — ED Notes (Signed)
Reviewed in detail prescribed medications and precautions for use, particularly becoming drowsy with cyclobenzaprine

## 2023-07-31 NOTE — ED Provider Notes (Signed)
[facility]    CSN: [id number] Arrival date & time: 07/31/23  1601      History   Chief Complaint Chief Complaint  Patient presents with   Back Pain    HPI Justin Cox is a 42 y.o. male presents for back pain.  Patient has a complex medical history including asthma, HIV, hypertension, CHF with normal EF who presents for acute on chronic back pain.  Patient has a history of chronic left lower back pain with sciatica.  Reports over the past couple days he has been having bilateral low back pain that radiates both into his left and right leg.  Denies any injury but does state he bends over a lot for work.  No numbness/tingling/weakness of his lower extremities, no bowel or bladder incontinence, no saddle paresthesia.  No history of back surgeries.  He took 1 dose of ibuprofen with minimal improvement.  Chart review does show that he has PT evaluation in May for his sciatica.  No other concerns at this time.   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   HTN (hypertension), malignant 05/30/2014   HLD (hyperlipidemia) 05/30/2014   Nonischemic cardiomyopathy (HCC) 05/30/2014   Abnormal echocardiogram 05/09/2014   Chest pain 05/09/2014   DOE (dyspnea on exertion) 05/09/2014   Abnormal EKG 05/09/2014   Hyperlipidemia 04/10/2014   HIV infection (HCC) 04/10/2014   Hypertensive urgency 04/10/2014   TIA (transient ischemic attack) 04/10/2014    Past  Surgical History:  Procedure Laterality Date   INSERTION OF MESH N/A 08/30/2018   Procedure: INSERTION OF MESH;  Surgeon: Manus Rudd, MD;  Location: [facility] OR;  Service: General;  Laterality: N/A;   LEFT HEART CATHETERIZATION WITH CORONARY ANGIOGRAM N/A 06/30/2014   Procedure: LEFT HEART CATHETERIZATION WITH CORONARY ANGIOGRAM;  Surgeon: [name], MD;  Location: [facility] CATH LAB;  Service: Cardiovascular;  Laterality: N/A;   UMBILICAL HERNIA REPAIR N/A 08/30/2018   Procedure: HERNIA REPAIR UMBILICAL ADULT ERAS PATHWAY;  Surgeon: Manus Rudd, MD;  Location: [facility] OR;  Service: General;  Laterality: N/A;       Home Medications    Prior to Admission medications   Medication Sig Start Date End Date Taking? Authorizing Provider  cyclobenzaprine (FLEXERIL) 10 MG tablet Take 1 tablet (10 mg total) by mouth 2 (two) times daily as needed for muscle spasms. 07/31/23  Yes Radford Pax, NP  naproxen (NAPROSYN) 375 MG tablet Take 1 tablet (375 mg total) by mouth 2 (two) times daily as needed (low back pain). 08/01/23  Yes Radford Pax, NP  allopurinol (ZYLOPRIM) 100 MG tablet Take 100 mg by mouth daily. 10/06/15   [provider]  amLODipine (NORVASC) 10 MG tablet Take 1 tablet (10 mg total) by mouth daily. 07/13/22 07/08/23  Yates Decamp, MD  Ascorbic Acid (VITAMIN C) 1000 MG tablet Take 1,000 mg by mouth daily.    [provider]  dolutegravir-lamiVUDine ([name]) 50-300 MG tablet Take 1 tablet by mouth daily.    [provider]  ENTRESTO 97-103 MG Take 1 tablet by mouth 2 (two) times daily. 07/13/22   Yates Decamp, MD  Nebivolol HCl 20 MG TABS Take 1 tablet (20 mg total) by mouth daily. 06/01/22   Yates Decamp, MD  omega-3 acid ethyl esters (LOVAZA) 1 g capsule Take 2 capsules by mouth twice daily 03/15/23   Yates Decamp, MD  rosuvastatin (CRESTOR) 10 MG tablet Take 1 tablet (10 mg total) by mouth daily. 07/13/22   Yates Decamp, MD  traZODone (DESYREL) 50 MG tablet Take 0.5 tablets  (25 mg total) by mouth at bedtime as needed for sleep. 08/04/21   Lomax, Amy, NP    Family History Family History  Problem Relation Age of Onset   Hypertension Mother    Diabetes Father    Hyperlipidemia Father    Hypertension Father    Hypertension Other    Hyperlipidemia Other    Diabetes Other    Hypertension Brother     Social History Social History   Tobacco Use   Smoking status: Former    Current packs/day: 0.00    Types: Cigarettes    Start date: 10/20/2004    Quit date: 10/20/2010    Years since quitting: 12.7   Smokeless tobacco: Never   Tobacco comments:    smoked about 2 packs per week (when used to smoke)  Vaping Use   Vaping status: Never Used  Substance Use Topics   Alcohol use: Yes    Alcohol/week: 3.0 standard drinks of alcohol    Types: 3 Standard drinks or equivalent per week    Comment: Occasional   Drug use: No     Allergies   Enalapril   Review of Systems Review of Systems  Musculoskeletal:  Positive for back pain.     Physical Exam Triage Vital Signs ED Triage Vitals [07/31/23 1630]  Encounter Vitals Group     BP (!) 182/111     Systolic BP Percentile      Diastolic BP Percentile      Pulse Rate 72     Resp 17     Temp 98.9 F (37.2 C)     Temp Source Oral     SpO2 95 %     Weight      Height      Head Circumference      Peak Flow      Pain Score 10     Pain Loc      Pain Education      Exclude from Growth Chart    No data found.  Updated Vital Signs BP (!) 179/113   Pulse 72   Temp 98.9 F (37.2 C) (Oral)   Resp 17   SpO2 95%   Visual Acuity Right Eye Distance:   Left Eye Distance:   Bilateral Distance:    Right Eye Near:   Left Eye Near:    Bilateral Near:     Physical Exam Vitals and nursing note reviewed.  Constitutional:      General: He is not in acute distress.    Appearance: Normal appearance. He is not ill-appearing.  HENT:     Head: Normocephalic and atraumatic.  Eyes:     Pupils: Pupils are  equal, round, and reactive to light.  Cardiovascular:     Rate and Rhythm: Normal rate.  Pulmonary:     Effort: Pulmonary effort is normal.  Musculoskeletal:     Lumbar back: Tenderness present. No swelling, edema, deformity, signs of trauma, lacerations, spasms or bony tenderness. Normal range of motion. Positive right straight leg raise test and positive left straight leg raise test. No scoliosis.       Back:     Comments: Strength 5 out of 5 bilateral lower extremities.  Skin:    General: Skin is warm and dry.  Neurological:     General: No focal deficit present.     Mental Status: He is alert and oriented to person, place, and time.  Psychiatric:        Mood and Affect: Mood normal.        Behavior: Behavior normal.      UC Treatments / Results  Labs (all labs ordered are listed, but only abnormal results are displayed) Labs Reviewed - No data to display  EKG   Radiology No results found.  Procedures Procedures (including critical care time)  Medications Ordered in UC Medications  ketorolac (TORADOL) 30 MG/ML injection 30 mg (30 mg Intramuscular Given 07/31/23 1719)    Initial Impression / Assessment and Plan / UC Course  I have reviewed the triage vital signs and the nursing notes.  Pertinent labs & imaging results that were available during my care of the patient were reviewed by me and considered in my medical decision making (see chart for details).     Reviewed exam and symptoms with patient.  No red flags.  Patient given Toradol injection in clinic.  He was monitored for 10 minutes after injection with no reaction noted and tolerated well.  Instructed no NSAIDs for 24 hours and verbalized understanding.  Will do trial of Flexeril.  Rx naproxen to start tomorrow, 11/12.  Heat and rest.  PCP follow-up as symptoms do not improve.  ER precautions reviewed Final Clinical Impressions(s) / UC Diagnoses   Final diagnoses:  Chronic bilateral low back pain with  bilateral sciatica     Discharge Instructions      You were given a Toradol injection in clinic today. Do not take any over the counter NSAID's such as Advil, ibuprofen, Aleve, or naproxen for 24 hours. You may take tylenol if needed.  You may take Flexeril as needed.  Please of this medication can make you drowsy.  Do not drink alcohol or drive on this medication.  Restart naproxen twice daily as needed for your back pain tomorrow, 11/12.  Heat and rest to the back.  Please follow-up with your PCP if your symptoms do not improve.  Please go to the ER for any worsening symptoms.  I hope you feel better soon!      ED Prescriptions     Medication Sig Dispense Auth. Provider   cyclobenzaprine (FLEXERIL) 10 MG tablet Take 1 tablet (10 mg total) by mouth 2 (two) times daily as needed for muscle spasms. 10 tablet Radford Pax, NP   naproxen (NAPROSYN) 375 MG tablet Take 1 tablet (375 mg total) by mouth 2 (two) times daily as needed (low back pain). 10 tablet Radford Pax, NP      PDMP not reviewed this encounter.   Radford Pax, NP 07/31/23 1730

## 2023-08-07 NOTE — Unmapped (Signed)
[facility] Delivery Pharm[facility] Refill Coordination Note    Specialty Medication(s) to be Shipped:   Infectious Disease: [name]    Other medication(s) to be shipped: No additional medications requested for fill at this time     Benjamin Ortiz, DOB: 08-21-1981  Phone: [phone] (home)       All above HIPAA information was verified with patient.     Was a Nurse, learning disability used for this call? No    Completed refill call assessment today to schedule patient's medication shipment from the [facility] Delivery Pharmacy  [phone]).  All relevant notes have been reviewed.     Specialty medication(s) and dose(s) confirmed: Regimen is correct and unchanged.   Changes to medications: Rechard reports no changes at this time.  Changes to insurance: No  New side effects reported not previously addressed with a pharmacist or physician: None reported  Questions for the pharmacist: No    Confirmed patient received a Conservation officer, historic buildings and a Surveyor, mining with first shipment. The patient will receive a drug information handout for each medication shipped and additional FDA Medication Guides as required.       DISEASE/MEDICATION-SPECIFIC INFORMATION        N/A    SPECIALTY MEDICATION ADHERENCE     Medication Adherence    Patient reported X missed doses in the last month: 0  Specialty Medication: [name] 50-300 mg Tab (dolutegravir-lamiVUDine)  Patient is on additional specialty medications: No  Patient is on more than two specialty medications: No  Any gaps in refill history greater than 2 weeks in the last 3 months: no  Demonstrates understanding of importance of adherence: yes  Informant: patient  Confirmed plan for next specialty medication refill: delivery by pharmacy  Refills needed for supportive medications: not needed          Refill Coordination    Has the Patients' Contact Information Changed: No  Is the Shipping Address Different: No         Were doses missed due to medication being on hold? No    [name] 50-300   mg: 5 days of medicine on hand       REFERRAL TO PHARMACIST     Referral to the pharmacist: Not needed      [facility]     Shipping address confirmed in Epic.       Delivery Scheduled: Yes, Expected medication delivery date: 08/11/23.     Medication will be delivered via UPS to the prescription address in Epic WAM.    Kerby Less   [facility] Delivery Pharm[facility]  Specialty Technician

## 2023-08-10 MED FILL — DOVATO 50 MG-300 MG TABLET: ORAL | 30 days supply | Qty: 30 | Fill #1

## 2023-09-11 DIAGNOSIS — B20 Human immunodeficiency virus [HIV] disease: Principal | ICD-10-CM

## 2023-09-11 MED ORDER — DOVATO 50 MG-300 MG TABLET
ORAL_TABLET | Freq: Every day | ORAL | 1 refills | 30.00 days | Status: CP
Start: 2023-09-11 — End: ?
  Filled 2023-09-22: qty 30, 30d supply, fill #0

## 2023-09-11 NOTE — Unmapped (Signed)
Medication Requested: [name]      Future Appointments   Date Time Provider Department Center   10/11/2023  4:00 PM Rutstein, Theresia Lo, MD [facility]     Per Provider Note: Current regimen: [name]     Standing order protocol requirements met?: Yes    Sent to: Pharmacy per protocol    Days Supply Given: 30 days  Number of Refills: 1

## 2023-09-19 NOTE — Unmapped (Signed)
[facility] Delivery Pharmacy Refill Coordination Note    Specialty Medication(s) to be Shipped:   Infectious Disease: [name]    Other medication(s) to be shipped: No additional medications requested for fill at this time     Benjamin Ortiz, DOB: July 18, 1981  Phone: [phone] (home)       All above HIPAA information was verified with patient.     Was a Nurse, learning disability used for this call? No    Completed refill call assessment today to schedule patient's medication shipment from the [facility] and [facility] Delivery Pharmacy  [phone]).  All relevant notes have been reviewed.     Specialty medication(s) and dose(s) confirmed: Regimen is correct and unchanged.   Changes to medications: Rohil reports no changes at this time.  Changes to insurance: No  New side effects reported not previously addressed with a pharmacist or physician: None reported  Questions for the pharmacist: No    Confirmed patient received a Conservation officer, historic buildings and a Surveyor, mining with first shipment. The patient will receive a drug information handout for each medication shipped and additional FDA Medication Guides as required.       DISEASE/MEDICATION-SPECIFIC INFORMATION        N/A    SPECIALTY MEDICATION ADHERENCE     Medication Adherence    Patient reported X missed doses in the last month: 0  Specialty Medication: [name] 50-300 mg Tab (dolutegravir-lamiVUDine)  Patient is on additional specialty medications: No  Patient is on more than two specialty medications: No  Any gaps in refill history greater than 2 weeks in the last 3 months: no  Demonstrates understanding of importance of adherence: yes  Informant: patient  Confirmed plan for next specialty medication refill: delivery by pharmacy  Refills needed for supportive medications: not needed          Refill Coordination    Has the Patients' Contact Information Changed: No  Is the Shipping Address Different: No         Were doses missed due to medication being on hold? No    [name] 50-300   mg: 6 days of medicine on hand       REFERRAL TO PHARMACIST     Referral to the pharmacist: Not needed      [facility]     Shipping address confirmed in Epic.       Delivery Scheduled: Yes, Expected medication delivery date: 09/25/23.     Medication will be delivered via UPS to the prescription address in Epic WAM.    Kerby Less   [facility] Delivery Pharm[facility]  Specialty Technician

## 2023-10-11 ENCOUNTER — Ambulatory Visit
Admit: 2023-10-11 | Discharge: 2023-10-12 | Payer: BLUE CROSS/BLUE SHIELD | Attending: Student in an Organized Health Care Education/Training Program | Primary: Student in an Organized Health Care Education/Training Program

## 2023-10-11 DIAGNOSIS — Z23 Encounter for immunization: Principal | ICD-10-CM

## 2023-10-11 DIAGNOSIS — Z113 Encounter for screening for infections with a predominantly sexual mode of transmission: Principal | ICD-10-CM

## 2023-10-11 DIAGNOSIS — B20 Human immunodeficiency virus [HIV] disease: Principal | ICD-10-CM

## 2023-10-11 DIAGNOSIS — I5032 Chronic diastolic (congestive) heart failure: Principal | ICD-10-CM

## 2023-10-11 LAB — CBC W/ AUTO DIFF
BASOPHILS ABSOLUTE COUNT: 0.1 10*9/L (ref 0.0–0.1)
BASOPHILS RELATIVE PERCENT: 1.4 %
EOSINOPHILS ABSOLUTE COUNT: 0.1 10*9/L (ref 0.0–0.5)
EOSINOPHILS RELATIVE PERCENT: 1.9 %
HEMATOCRIT: 47.4 % (ref 39.0–48.0)
HEMOGLOBIN: 16.8 g/dL — ABNORMAL HIGH (ref 12.9–16.5)
LYMPHOCYTES ABSOLUTE COUNT: 2.8 10*9/L (ref 1.1–3.6)
LYMPHOCYTES RELATIVE PERCENT: 50.3 %
MEAN CORPUSCULAR HEMOGLOBIN CONC: 35.4 g/dL (ref 32.0–36.0)
MEAN CORPUSCULAR HEMOGLOBIN: 35.6 pg — ABNORMAL HIGH (ref 25.9–32.4)
MEAN CORPUSCULAR VOLUME: 100.6 fL — ABNORMAL HIGH (ref 77.6–95.7)
MEAN PLATELET VOLUME: 10.6 fL (ref 6.8–10.7)
MONOCYTES ABSOLUTE COUNT: 0.5 10*9/L (ref 0.3–0.8)
MONOCYTES RELATIVE PERCENT: 10 %
NEUTROPHILS ABSOLUTE COUNT: 2 10*9/L (ref 1.8–7.8)
NEUTROPHILS RELATIVE PERCENT: 36.4 %
PLATELET COUNT: 125 10*9/L — ABNORMAL LOW (ref 150–450)
RED BLOOD CELL COUNT: 4.71 10*12/L (ref 4.26–5.60)
RED CELL DISTRIBUTION WIDTH: 13.4 % (ref 12.2–15.2)
WBC ADJUSTED: 5.5 10*9/L (ref 3.6–11.2)

## 2023-10-11 LAB — BASIC METABOLIC PANEL
ANION GAP: 7 mmol/L (ref 5–14)
BLOOD UREA NITROGEN: 9 mg/dL (ref 9–23)
BUN / CREAT RATIO: 11
CALCIUM: 10 mg/dL (ref 8.7–10.4)
CHLORIDE: 105 mmol/L (ref 98–107)
CO2: 28 mmol/L (ref 20.0–31.0)
CREATININE: 0.84 mg/dL (ref 0.73–1.18)
EGFR CKD-EPI (2021) MALE: >90 mL/min/{1.73_m2} (ref >=60)
GLUCOSE RANDOM: 101 mg/dL (ref 70–179)
POTASSIUM: 3.9 mmol/L (ref 3.4–4.8)
SODIUM: 140 mmol/L (ref 135–145)

## 2023-10-11 LAB — LIPID PANEL
CHOLESTEROL/HDL RATIO SCREEN: 5 — ABNORMAL HIGH (ref 1.0–4.5)
CHOLESTEROL: 215 mg/dL — ABNORMAL HIGH (ref <=200)
HDL CHOLESTEROL: 43 mg/dL (ref 40–60)
LDL CHOLESTEROL CALCULATED: 99 mg/dL (ref 40–99)
NON-HDL CHOLESTEROL: 172 mg/dL — ABNORMAL HIGH (ref 70–130)
TRIGLYCERIDES: 363 mg/dL — ABNORMAL HIGH (ref 0–150)
VLDL CHOLESTEROL CAL: 72.6 mg/dL — ABNORMAL HIGH (ref 11–50)

## 2023-10-11 LAB — BILIRUBIN, TOTAL: BILIRUBIN TOTAL: 0.3 mg/dL (ref 0.3–1.2)

## 2023-10-11 LAB — AST: AST (SGOT): 23 U/L (ref <=34)

## 2023-10-11 LAB — ALT: ALT (SGPT): 32 U/L (ref 10–49)

## 2023-10-11 MED ORDER — BYSTOLIC 20 MG TABLET
ORAL_TABLET | Freq: Every day | ORAL | 0 refills | 90 days | Status: CP
Start: 2023-10-11 — End: ?

## 2023-10-11 MED ORDER — DOVATO 50 MG-300 MG TABLET
ORAL_TABLET | Freq: Every day | ORAL | 3 refills | 30.00 days | Status: CP
Start: 2023-10-11 — End: 2023-10-11
  Filled 2023-11-06: qty 30, 30d supply, fill #0

## 2023-10-11 MED ORDER — SACUBITRIL 97 MG-VALSARTAN 103 MG TABLET
ORAL_TABLET | Freq: Two times a day (BID) | ORAL | 0 refills | 90.00 days | Status: CP
Start: 2023-10-11 — End: ?

## 2023-10-11 MED ORDER — ROSUVASTATIN 20 MG TABLET
ORAL_TABLET | Freq: Every day | ORAL | 3 refills | 90.00 days | Status: CP
Start: 2023-10-11 — End: ?

## 2023-10-11 MED ORDER — AMLODIPINE 10 MG TABLET
ORAL_TABLET | Freq: Every day | ORAL | 0 refills | 90 days | Status: CP
Start: 2023-10-11 — End: ?

## 2023-10-11 NOTE — Unmapped (Signed)
Infectious Diseases Clinic        Assessment/Plan:     HIV     Doing great w/ regard to this. Current regimen: [name] (3TC/DTG). Switched from [place] due to cost. Was on Atripla prior to that, following dx in 2006. Confirmed reactive HepB serology (04/2021) c/w vaccination.     Misses doses of ARVs rarely     Med access through insurance  CD4 count over 300 for >2Y on suppressive ART; no prophylaxis needed; recheck CD4 annually  Discussed ARV adherence    Lab Results   Component Value Date    ACD4 1,269 05/19/2021    CD4TABS 960 05/16/2022    CD4 47 05/19/2021    CD4TCELL 40.0 05/16/2022    HIVRS Detected (A) 05/19/2021    HIVCP <20 01/10/2023       CD4, HIV RNA, and safety labs (full return panel)   Continue current therapy  Encouraged continued excellent ARV adherence    HTN  Now follows with [place], poorly controlled at today's visit, reporting poor adherence due to cardiologist moving practices, didn't have access to three antihypertensives (Bystolic 20, amlodipine 10, Entresto 97-103 BID) since 08/24  - sent med refills  - will message Dr Brooke Dare for follow up on that (last saw around 5 mo ago)      NICM  HFrecEF  nonischemic dilated cardiomyopathy, coronary angiography on 06/30/2014 which revealed normal coronary arteries with ejection fraction of 35-40%, though recent echo 07/2022 with EF 55%, mild pulm regurg. EF now stable over last few years.  - per PCP/cards  - may require cards referral if cannot re-establish with [facility]    Severe OSA  Followed by neurology at [facility]. Adherent to cpap historically, not discussed 10/11/23    Hyperlipidemia  Followed by [facility] and [facility]). Prior TG 04/2021 were elevated, non-fasting. Were >700 back in 2021. Have encouraged f/u as above.  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 HRA 07/2019. Anal pap neg 04/2022  - anal pap on/after 04/2023, next visit     Mood/family challenges  His mother has progressive dementia now on hospice at home. Pt understandably struggling with this transition and decline, noting recent talks of whether to resuscitate her and he declined this so, citing he is in many ways starting to prep himself for end of her life. Siblings and he are providing extensive support to his father, as well, which is challenging. Offered reassurance/grief resources at today's visit.   - CTM    L Sciatica  Has come and gone for a couple years but worse since end of March 2024. Worse during work w/ a lot of activities like lifting, pushing carts. 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 Currently takes Goody's/Ibuprofen but not together.   - I recommended taking tylenol instead of alternating with ibuprofen for MAX of 3-4 days in a row  - We spent most time discussing how he could do stretching/PT w/ online videos (youtube, Doctor Alvino Chapel- PT).    Nicotine dependence  He reports that he quit smoking about 12 years ago. His smoking use included cigarettes. He has never used smokeless tobacco.      Sexual health & secondary prevention  Sex with cis men.  Last new sex ptnr  in Nov 2024    He does routinely discuss status with partner(s). Sometimes uses condoms.      Lab Results   Component Value Date    RPR Reactive (A) 01/10/2023    CTNAA Negative 01/11/2023    CTNAA Negative 01/11/2023    CTNAA Negative 01/11/2023    GCNAA Negative 01/11/2023    GCNAA Negative 01/11/2023    GCNAA Negative 01/11/2023    SPECSOURCE Rectum 01/11/2023    SPECSOURCE Urine 01/11/2023    SPECSOURCE Throat 01/11/2023       GC/CT NAATs -- obtained today from all exposed anatomical site(s)  RPR -- for screening obtained today     Health maintenance     Oral health  He does  have a dentist. Last dental exam was a few years ago at [facility]. Re-referred to [facility] 05/18/22, not discussed 09/2023    Eye health  He does not use corrective lenses. Last eye exam within last 12 months, through [facility]. Not discussed 12/2022    Metabolic conditions  Wt Readings from Last 5 Encounters:   10/11/23 93.9 kg (207 lb)   01/11/23 95.6 kg (210 lb 12.8 oz)   08/25/22 97.7 kg (215 lb 6.4 oz)   05/18/22 98.2 kg (216 lb 6.4 oz)   05/19/21 95.8 kg (211 lb 3.2 oz)     Lab Results   Component Value Date    CREATININE 0.83 01/10/2023    PROTEINUA Trace (A) 05/18/2022    PROTEINUR 6 01/22/2013    GLUCOSEU Negative 05/18/2022    PCRATIOUR 0.075 01/22/2013    GLU 90 05/19/2021    A1C 6.1 (H) 08/25/2022    ALT 34 01/10/2023    ALT 37 05/16/2022    ALT 29 05/19/2021     # Kidney health -  labs ordered  (Cr w pending labs)  # Bone health -  needs at next  - needs at next visit  # Diabetes assessment - defer mgm't to PCP  # NAFLD assessment - suspicion for NAFLD low    Communicable diseases  Lab Results   Component Value Date    HEPBSAB Reactive (A) 05/19/2021    HEPCAB Nonreactive 05/19/2021     # TB screening - assessment needed but deferred to future visit  # Hepatitis screening - no indication for screening  # MMR screening - not assessed    Cancer screening  Lab Results   Component Value Date    FINALDX  05/18/2022     A. Anal pap:  - Negative for intraepithelial lesion or malignancy (NILM)    This electronic signature is attestation that the pathologist personally reviewed the submitted material(s) and the final diagnosis reflects that evaluation.       # Cervical -  n/a  # Breast - no indication for screening at present    # Anorectal - repeat anal Pap 68m from prior  # Colorectal - screening not indicated, start at 45   # Liver - no screening indicated  # Lung - screening not indicated  # Prostate - screening not indicated    Cardiovascular disease  Lab Results   Component Value Date    CHOL 184 05/16/2022    HDL 28 (L) 05/16/2022    LDL 53 05/16/2022    NONHDL  05/19/2021      Comment:      Non-HDL Cholesterol Recommended Ranges (mg/dL)  Optimal <578  Near IONGEXB284 - 159  Borderline High160 - 189  High  190 - 219  Very High      >220      TRIG 700 (HH) 05/16/2022     # The 10-year ASCVD risk score (Arnett DK, et al., 2019) is: 10%  - is taking aspirin   - is taking statin  - BP control poor  - former smoker  # AAA screening - no indication for screening    Immunization History   Administered Date(s) Administered    COVID-19 VACC,MRNA,(PFIZER)(PF) 12/07/2019, 12/28/2019, 08/08/2020    Covid-19 Vac, (11yr+) (Spikevax) Monovalent Moderna 08/25/2022    HEPATITIS B VACCINE ADULT,IM(ENERGIX B, RECOMBIVAX) 04/08/2017, 06/09/2017, 01/16/2018    Hepatitis A (Adult) 04/24/2007, 04/08/2008    Hepatitis B Vaccine, Unspecified Formulation 07/07/1994, 08/18/1994, 01/12/1995    INFLUENZA VACCINE IIV3(IM)(PF)6 MOS UP 10/11/2023    Influenza Vaccine Quad (IIV4 PF) 6-66mo 06/11/2022    Influenza Vaccine Quad(IM)6 MO-Adult(PF) 05/30/2019, 06/02/2020    Influenza Virus Vaccine, unspecified formulation 06/29/2015, 05/27/2019    PNEUMOCOCCAL POLYSACCHARIDE 23-VALENT 04/12/2005, 02/22/2011    PPD Test 04/08/2008, 06/30/2009, 10/06/2009    Pneumococcal Conjugate 13-Valent 02/04/2014    SHINGRIX-ZOSTER VACCINE (HZV),RECOMBINANT,ADJUVANTED(IM) 01/11/2023    SMALLPOX,MPOX(PF)(JYNNEOS) 05/19/2021    TD(TDVAX),ADSORBED,2LF(IM)(PF) 12/22/2015    TdaP 03/14/2006, 04/05/2016, 04/08/2017       Immunizations today - influenza seasonal (1 dose)    I personally spent 45 minutes face-to-face and non-face-to-face in the care of this patient, which includes all pre, intra, and post visit time on the date of service.  All documented time was specific to the E/M visit and does not include any procedures that may have been performed.    Pt seen and evaluated with Dr. Cleon Dew.    Disposition    Return to clinic 5-6 months or sooner if needed.    To do @ next RTC  F/u on Alcohol use    BP  Lipids   Cards referral?  Bone health labs                Chief Complaint   Follow-up HIV      HPI  In addition to details in A&P above:    Front end Production designer, theatre/television/film at [employer]. Back to school and holiday seasons are pretty stressful. Drinking four lokos once a week in the past. Drinks 1-2 of them, drinking to get drunk as often now as every other day. Sleeps from midnight to 2 am. Pending work schedule gets home between 8-11:30pm. Takes him a few hours after then to get to sleep. Sometimes drinking helps sleeping in. Wants to drink less. Previously would ride a bike around trails for exercise. Shoot hoops.     Mom was drinking ensures every day but now down to 1-1.5 daily only. He Goes on days off to help relieve his dad from caretaking so that he can do errands. Has 3 other siblings helping too.     Hasn't had access to BP meds since August (bystolic, Entresto, Amlodipine). Cardiologist had practice closed and is restarting nearby but doesn't have close availability.     Started doing top golf. Used to do bowling. Stretches every day in the morning and at night.     Past Medical History:   Diagnosis Date    Abnormal electrocardiogram 05/09/2014    HIV disease (CMS-HCC)     HTN (hypertension) 01/22/2013    Malignant hypertension 05/30/2014    Pain, dental 07/23/2013         Social History  Housing - in apt in [place] with his  brother    School / Work - Not in school. Employed Public relations account executive)    Tobacco - He reports that he quit smoking about 12 years ago. His smoking use included cigarettes. He has never used smokeless tobacco.   Alcohol -  4 day(s) per week, 4-6 drink(s) per sitting,     Substance use - none      Review of Systems  As per HPI. All others negative.      Medications and Allergies  He has a current medication list which includes the following prescription(s): ascorbic acid (vitamin c), blood pressure test kit-large, ibuprofen, omega-3 acid ethyl esters, allopurinol, amlodipine, bystolic, dovato, rosuvastatin, sacubitril-valsartan, and trazodone.    Allergies: Enalapril      Family History  His family history includes Diabetes in his father; Heart disease in his father; Rheumatologic disease in his mother.                   BP 171/112 (BP Site: L Arm, BP Position: Sitting, BP Cuff Size: Large)  - Pulse 91  - Temp 36.7 ??C (98 ??F) (Temporal)  - Ht 182.9 cm (6')  - Wt 93.9 kg (207 lb)  - BMI 28.07 kg/m??      Const looks well and attentive, alert, appropriate   Eyes sclerae anicteric, noninjected OU   ENT deferred   Lymph deferred   CV Well perfused, hypertensive   Resp Comfortable on room air    GI deferred   GU deferred   Rectal deferred   Skin no petechiae, ecchymoses or obvious rashes on clothed exam   MSK Seated comfortably w/o any ambulatory assistive device   Neuro CN II-XII grossly intact, MAEE, non focal   Psych Emotive, at one instance almost tearful regarding alcohol use and in discussing his mother. Optimistic for improvements in new year. Eye contact good. Linear thoughts. Fluent speech.

## 2023-10-12 LAB — LYMPH MARKER LIMITED,FLOW
ABSOLUTE CD3 CNT: 2044 {cells}/uL (ref 915–3400)
ABSOLUTE CD4 CNT: 1288 {cells}/uL (ref 510–2320)
ABSOLUTE CD8 CNT: 756 {cells}/uL (ref 180–1520)
CD3% (T CELLS): 73 % (ref 61–86)
CD4% (T HELPER): 46 % (ref 34–58)
CD4:CD8 RATIO: 1.7 (ref 0.9–4.8)
CD8% T SUPPRESR: 27 % (ref 12–38)

## 2023-10-12 LAB — HIV RNA, QUANTITATIVE, PCR
HIV RNA QNT RSLT: DETECTED — AB
HIV RNA: <20 {copies}/mL — ABNORMAL HIGH (ref <0)

## 2023-10-12 LAB — SYPHILIS SCREEN: RPR TITER REFLEX: 1:4 {titer} — AB

## 2023-10-12 NOTE — Unmapped (Signed)
Name: Arlander Gillen  Date: 10/11/2023  Address: [address]  [place] [address]   [place] of Residence:  [place] Bast  Phone: [phone]     Started assessment with patient options: in clinic    Is this the same address for mailing? Yes  If no, Mailing Address is:     What is your preferred method of contact? MyChart    Is there anyone that you would want to add as your personal contact? Yes; if yes, please use SmartPhrase RWPersonalContactInfo to gather their contacts information.    Full Name: Michaell Grider  DOB: 04/30/1950  Relationship to you: Father  Phone number: [phone]  Are they your emergency contact? Yes  Is their address the same as you?  No, if no, what is their address? [address] [place], [place] [address]  Is this person aware of your HIV status? Yes     Housing Status  Temporary; if so, what is their housing type: Other temporary arrangement - patient is in the process on having to move and find a new apartment, as rental property patient is currently in is up for sale. Patient is currently apartment hunting.    Nutritional therapist    Marital Status  Single    Tax Press photographer Status  Single    Employment Status  Employed Full Time    Income  Salary/Wages    If no or low income, how are you meeting your basic needs?  Not Applicable    List Tax Household Members including relationship to you:   N/A    Someone in my household receives: Not Applicable (for household members)  Specify who: N/A    Do you or anyone in your home have income adjustments? No    If yes, which adjustments do you have? N/A      Medication Access/Barriers: none    Do you have a current diagnosis for Hepatitis C?  Lab Results   Component Value Date    HEPCAB Nonreactive 05/19/2021       Federal Marketplace Eligibility Assessment  Patient has affordable insurance through [employer], [place], and or Employment and is not eligible.    Patient given ACA education if they qualified based on answers to questions above. MyChart  Do you have an active MyChart account? Yes     If MyChart is not set up, informed patient on how to set up MyChart No    Patient was informed of the following programs;   N/A    The following applications/handouts were given to patient:   N/A    The following forms were also started with the patient:   N/A    Medicaid:  N/A      Ryan White/HMAP application status: Incomplete; patient needs to send 2 paystubs    Patient is applying for [employer] on Charges Only     Additional Comments: patient will submit income documents via text message. Patient confirmed phone number on file.    ____________________________________________________________________    The patient provides verbal consent and agrees to the following:    RW Consent  I agree to notify the interviewer within 30 days about any changes to my address, financial resources, expenses, family situation, or health insurance coverage that may impact my eligibility for Department payment programs. I certify that the information I have provided is accurate and complete to the best of my knowledge. I understand that information provided may be verified by a state reviewer, and I  agree to submit any necessary financial records required for this review. I also understand that my employer may be asked to verify information concerning my income.    Caps on Charges Consent  The [employer] Program requires that both insured and uninsured individuals be charged no more than a specified maximum amount in a calendar year, based on their individual income.          Madinah Cathcart-Rowe  Benefits Counselor  Time of Intervention: 15 mins

## 2023-10-13 NOTE — Unmapped (Signed)
PROMIS Tablet Screening  Completed Date: 10/11/23    SW reviewed self-administered screening.    Patient had a PHQ-9 score of 1  indicating None-Minimal depression.   Pt denies SI.   Pt scored 3 on AUDIT/AUDIT-C indicating Not-at-risk alcohol consumption  Pt  denies substance use in past 3 months.  Pt denies concerns for IPV.    Pt seen by provider for regular ID visit.  Pt was not seen in person by Social Work during this visit.     Breia Ocampo, LCSWA  [place] [facility] Social Work

## 2023-10-14 NOTE — Unmapped (Signed)
Prior Auth required for Bystolic 20mg  tab., Per insurance reply via cover my meds, only generic medication covered Nebivolol HCI.   Benjamin Ortiz, CMA, asked Dr. Juel Burrow if okay to switch medication to Nebivolol HCI, per provider okay to switch to generic. CMA called [facility] pharmacy and made the change, pharmacist confirmed medication covered with a $4 copay.

## 2023-10-16 NOTE — Unmapped (Signed)
Received patient income via text message.      RW/Caps on Charges services eligible until 09/18/2024 (IPL;FPL= 295%)       Madinah Cathcart-Rowe  Benefits Counselor  Time of Intervention: 5 mins

## 2023-10-26 DIAGNOSIS — Z2989 Need for prophylaxis against sexually transmitted diseases: Principal | ICD-10-CM

## 2023-10-26 MED ORDER — DOXYCYCLINE MONOHYDRATE 100 MG CAPSULE
ORAL_CAPSULE | Freq: Once | ORAL | 0 refills | 0.00 days | Status: CP | PRN
Start: 2023-10-26 — End: ?

## 2023-10-26 NOTE — Unmapped (Signed)
Nursing Outreach    Received the following message from the patient:    AB: Benjamin Ortiz how are you today  RN:Hi Benjamin Ortiz. My name is Benjamin Ortiz and I am covering for [name] who is out on vacation. Is there anything I can help with?  ZO:XWRU, I was going to ask him about a after sex pill to help prevent catching a STD  RN: Sure! I can go ahead and send your provider a message about sending that script in. Just want to ask a few questions, are you having any symptoms currently?and what pharmacy do you want [place] to send this to?  AB:No symptoms, and [facility] on [address].  RN: Rip Harbour, I sent Dr. Juel Burrow a message, and requested the prescription. I do want to tell you a few things about this medication.  AB: Okay   RN:The instructions are as follows: Take two 100mg  pills soon after sex, ideally within 24 hours, and no later than 72 hours.      It may be more effective the sooner you take it.      If you have sex again within 24 hours of taking Doxy PEP, take Doxy PEP again 24 hours after your last dose.      You can take Doxy PEP as often as once a day when you are having sex.      Don???t take more than 200 mg (two 100 mg pills) every 24 hours  RN: It is very important to take this with food, because it may cause stomach upset if you take it on an empty stomach.  RN:    Take with a large glass of water.      Stay upright for 30 minutes after taking Doxy PEP to prevent esophagus (throat) irritation.      Avoid dairy products, calcium, antacids, or multivitamins 2 hours before and after taking Doxy           PEP. Wear sunscreen, stay covered or in the shade to avoid skin rashes from the sun while     taking Doxy PEP.  RN: I sent her the message but it may take her some time to respond. I just wanted to send you the info. Does it all make sense? I know I sent alot of info  AB:?? clear and I understand ??. Thank you     RN sent message on 2/7  RN: Peri Jefferson morning Benjamin Ortiz, Dr Juel Burrow sent the prescription for doxy to [facility]  AB: Thank you

## 2023-11-01 NOTE — Unmapped (Signed)
[facility] Refill Coordination Note    Specialty Medication(s) to be Shipped:   Infectious Disease: [name]    Other medication(s) to be shipped: No additional medications requested for fill at this time     Benjamin Ortiz, DOB: 10-May-1981  Phone: [phone] (home)       All above HIPAA information was verified with patient.     Was a Nurse, learning disability used for this call? No    Completed refill call assessment today to schedule patient's medication shipment from the [facility] Delivery Pharm[facility]  [phone]).  All relevant notes have been reviewed.     Specialty medication(s) and dose(s) confirmed: Regimen is correct and unchanged.   Changes to medications: Benjamin Ortiz reports no changes at this time.  Changes to insurance: No  New side effects reported not previously addressed with a pharmacist or physician: None reported  Questions for the pharmacist: No    Confirmed patient received a Conservation officer, historic buildings and a Surveyor, mining with first shipment. The patient will receive a drug information handout for each medication shipped and additional FDA Medication Guides as required.       DISEASE/MEDICATION-SPECIFIC INFORMATION        N/A    SPECIALTY MEDICATION ADHERENCE     Medication Adherence    Patient reported X missed doses in the last month: 0  Specialty Medication: [name] 50-300mg  QD  Patient is on additional specialty medications: No  Patient is on more than two specialty medications: No  Any gaps in refill history greater than 2 weeks in the last 3 months: no  Demonstrates understanding of importance of adherence: yes  Informant: patient  Confirmed plan for next specialty medication refill: delivery by pharmacy  Refills needed for supportive medications: not needed          Refill Coordination    Has the Patients' Contact Information Changed: No  Is the Shipping Address Different: No         Were doses missed due to medication being on hold? No    [name] 50-300  mg: 5 days of medicine on hand        REFERRAL TO PHARMACIST     Referral to the pharmacist: Not needed      [facility]     Shipping address confirmed in Epic.       Delivery Scheduled: Yes, Expected medication delivery date: 11/07/23.     Medication will be delivered via UPS to the prescription address in Epic WAM.    Kerby Less   [facility]  Specialty Technician

## 2023-11-08 ENCOUNTER — Other Ambulatory Visit: Payer: Self-pay | Admitting: Medical Genetics

## 2023-12-07 DIAGNOSIS — I5032 Chronic diastolic (congestive) heart failure: Principal | ICD-10-CM

## 2023-12-07 MED ORDER — AMLODIPINE 10 MG TABLET
ORAL_TABLET | Freq: Every day | ORAL | 0 refills | 0.00 days
Start: 2023-12-07 — End: ?

## 2023-12-08 MED ORDER — AMLODIPINE 10 MG TABLET
ORAL_TABLET | Freq: Every day | ORAL | 0 refills | 90 days | Status: CP
Start: 2023-12-08 — End: ?

## 2023-12-08 NOTE — Unmapped (Signed)
 [facility] Delivery Pharm[facility] Refill Coordination Note    Specialty Medication(s) to be Shipped:   Infectious Disease: [name]    Other medication(s) to be shipped: No additional medications requested for fill at this time     Benjamin Ortiz, DOB: [birth year]  Phone: [phone] (home)       All above HIPAA information was verified with patient.     Was a Nurse, learning disability used for this call? No    Completed refill call assessment today to schedule patient's medication shipment from the [facility] and Home Delivery Pharmacy  [phone]).  All relevant notes have been reviewed.     Specialty medication(s) and dose(s) confirmed: Regimen is correct and unchanged.   Changes to medications: Benjamin Ortiz reports no changes at this time.  Changes to insurance: No  New side effects reported not previously addressed with a pharmacist or physician: None reported  Questions for the pharmacist: No    Confirmed patient received a Conservation officer, historic buildings and a Surveyor, mining with first shipment. The patient will receive a drug information handout for each medication shipped and additional FDA Medication Guides as required.       DISEASE/MEDICATION-SPECIFIC INFORMATION        N/A    SPECIALTY MEDICATION ADHERENCE     Medication Adherence    Patient reported X missed doses in the last month: 0  Specialty Medication: [name] 50-300 mg Tab (dolutegravir-lamiVUDine)  Patient is on additional specialty medications: No              Were doses missed due to medication being on hold? No    dolutegravir-lamiVUDine: [name] 50-300 mg Tab: 4 days of medicine on hand       REFERRAL TO PHARMACIST     Referral to the pharmacist: Not needed      [facility]     Shipping address confirmed in Epic.     Cost and Payment: Patient has a $0 copay, payment information is not required.    Delivery Scheduled: Yes, Expected medication delivery date: 12/12/2023.     Medication will be delivered via UPS to the prescription address in Epic WAM.    Benjamin Ortiz   [facility]  Specialty Technician

## 2023-12-08 NOTE — Unmapped (Signed)
 Medication Requested: amlodipine       Future Appointments   Date Time Provider Department Center   01/10/2024  1:00 PM Leveda Anna, MD [facility]     Per Provider Note:   This medication is included in this patients clinical summary.     Standing order protocol requirements met?: Yes    Sent to: Pharmacy per protocol    Days Supply Given: 30 days  Number of Refills: 2

## 2023-12-11 MED FILL — DOVATO 50 MG-300 MG TABLET: ORAL | 30 days supply | Qty: 30 | Fill #1

## 2023-12-15 ENCOUNTER — Ambulatory Visit
Admission: EM | Admit: 2023-12-15 | Discharge: 2023-12-15 | Disposition: A | Discharge status: Home / Self Care | Attending: Family Medicine | Admitting: Family Medicine

## 2023-12-15 DIAGNOSIS — M5432 Sciatica, left side: Secondary | ICD-10-CM | POA: Diagnosis present

## 2023-12-15 DIAGNOSIS — R319 Hematuria, unspecified: Secondary | ICD-10-CM | POA: Diagnosis present

## 2023-12-15 LAB — POCT URINALYSIS DIP (MANUAL ENTRY)
Bilirubin, UA: NEGATIVE
Glucose, UA: NEGATIVE mg/dL
Ketones, POC UA: NEGATIVE mg/dL
Leukocytes, UA: NEGATIVE
Nitrite, UA: NEGATIVE
Protein Ur, POC: 30 mg/dL — AB
Spec Grav, UA: 1.02 (ref 1.010–1.025)
Urobilinogen, UA: 1 U/dL
pH, UA: 7 (ref 5.0–8.0)

## 2023-12-15 MED ORDER — PREDNISONE 20 MG PO TABS: ORAL_TABLET | ORAL | 0 refills | Status: AC

## 2023-12-15 MED ORDER — CYCLOBENZAPRINE HCL 5 MG PO TABS: 5.0000 mg | ORAL_TABLET | Freq: Every evening | ORAL | 0 refills | Status: AC | PRN

## 2023-12-15 NOTE — ED Triage Notes (Signed)
 Patient here today with c/o left side groin pain and burning in urination since yesterday morning. Patient also stated that he has not had a BM in 24 hours.

## 2023-12-15 NOTE — ED Provider Notes (Signed)
 [facility] URGENT CARE CENTER  Note:  This document was prepared using Conservation officer, historic buildings and may include unintentional dictation errors.  MRN: [id number] DOB: 1981/07/11  Subjective:   Justin Cox is a 43 y.o. male presenting for 2-day history of persistent left-sided buttock pain that radiates down the hamstring with associated numbness and tingling.  No dysuria, hematuria, urinary frequency.  Patient did first notice this pain when he woke up at night to go urinate.  However, denies any particular urinary symptoms.  Has never had renal stones but reports that they do run in the family.  No fall, trauma, history of spinal issues.  No current facility-administered medications for this encounter.  Current Outpatient Medications:    allopurinol (ZYLOPRIM) 100 MG tablet, Take 100 mg by mouth daily., Disp: , Rfl:    amLODipine (NORVASC) 10 MG tablet, Take 1 tablet (10 mg total) by mouth daily., Disp: 90 tablet, Rfl: 3   Ascorbic Acid (VITAMIN C) 1000 MG tablet, Take 1,000 mg by mouth daily., Disp: , Rfl:    dolutegravir-lamiVUDine ([name]) 50-300 MG tablet, Take 1 tablet by mouth daily., Disp: , Rfl:    ENTRESTO 97-103 MG, Take 1 tablet by mouth 2 (two) times daily., Disp: 180 tablet, Rfl: 3   naproxen (NAPROSYN) 375 MG tablet, Take 1 tablet (375 mg total) by mouth 2 (two) times daily as needed (low back pain)., Disp: 10 tablet, Rfl: 0   Nebivolol HCl 20 MG TABS, Take 1 tablet (20 mg total) by mouth daily., Disp: 90 tablet, Rfl: 3   rosuvastatin (CRESTOR) 10 MG tablet, Take 1 tablet (10 mg total) by mouth daily., Disp: 90 tablet, Rfl: 3   Allergies  Allergen Reactions   Enalapril Cough    Past Medical History:  Diagnosis Date   Asthma    CHF (congestive heart failure) (HCC)    Headache    High cholesterol    HIV infection (HCC)    Hypertension    TIA (transient ischemic attack)    Umbilical hernia      Past Surgical History:  Procedure Laterality Date    INSERTION OF MESH N/A 08/30/2018   Procedure: INSERTION OF MESH;  Surgeon: Manus Rudd, MD;  Location: [facility] OR;  Service: General;  Laterality: N/A;   LEFT HEART CATHETERIZATION WITH CORONARY ANGIOGRAM N/A 06/30/2014   Procedure: LEFT HEART CATHETERIZATION WITH CORONARY ANGIOGRAM;  Surgeon: [name], MD;  Location: [name] CATH LAB;  Service: Cardiovascular;  Laterality: N/A;   UMBILICAL HERNIA REPAIR N/A 08/30/2018   Procedure: HERNIA REPAIR UMBILICAL ADULT ERAS PATHWAY;  Surgeon: Manus Rudd, MD;  Location: [facility] OR;  Service: General;  Laterality: N/A;    Family History  Problem Relation Age of Onset   Hypertension Mother    Diabetes Father    Hyperlipidemia Father    Hypertension Father    Hypertension Other    Hyperlipidemia Other    Diabetes Other    Hypertension Brother     Social History   Tobacco Use   Smoking status: Former    Current packs/day: 0.00    Types: Cigarettes    Start date: 10/20/2004    Quit date: 10/20/2010    Years since quitting: 13.1   Smokeless tobacco: Never   Tobacco comments:    smoked about 2 packs per week (when used to smoke)  Vaping Use   Vaping status: Never Used  Substance Use Topics   Alcohol use: Yes    Alcohol/week: 3.0 standard drinks of  alcohol    Types: 3 Standard drinks or equivalent per week    Comment: Occasional   Drug use: No    ROS   Objective:   Vitals: BP (!) 143/90 (BP Location: Right Arm)   Pulse 90   Temp 98.2 F (36.8 C) (Oral)   Resp 16   Ht 6' (1.829 m)   Wt 213 lb (96.6 kg)   SpO2 94%   BMI 28.89 kg/m   Physical Exam Constitutional:      General: He is not in acute distress.    Appearance: Normal appearance. He is well-developed and normal weight. He is not ill-appearing, toxic-appearing or diaphoretic.  HENT:     Head: Normocephalic and atraumatic.     Right Ear: External ear normal.     Left Ear: External ear normal.     Nose: Nose normal.     Mouth/Throat:     Pharynx: Oropharynx is  clear.  Eyes:     General: No scleral icterus.       Right eye: No discharge.        Left eye: No discharge.     Extraocular Movements: Extraocular movements intact.  Cardiovascular:     Rate and Rhythm: Normal rate.  Pulmonary:     Effort: Pulmonary effort is normal.  Abdominal:     General: Bowel sounds are normal. There is no distension.     Palpations: Abdomen is soft. There is no mass.     Tenderness: There is no abdominal tenderness. There is no right CVA tenderness, left CVA tenderness, guarding or rebound.  Genitourinary:    Testes:        Right: Mass, tenderness, swelling, testicular hydrocele or varicocele not present. Right testis is descended. Cremasteric reflex is present.         Left: Mass, tenderness, swelling, testicular hydrocele or varicocele not present. Left testis is descended. Cremasteric reflex is present.   Musculoskeletal:     Cervical back: Normal range of motion.     Lumbar back: Tenderness (over area outlined) present. No swelling, edema, deformity, signs of trauma, lacerations, spasms or bony tenderness. Normal range of motion. Positive left straight leg raise test. Negative right straight leg raise test. No scoliosis.       Back:  Skin:    General: Skin is warm and dry.  Neurological:     Mental Status: He is alert and oriented to person, place, and time.  Psychiatric:        Mood and Affect: Mood normal.        Behavior: Behavior normal.        Thought Content: Thought content normal.        Judgment: Judgment normal.     Results for orders placed or performed during the hospital encounter of 12/15/23 (from the past 24 hours)  POCT urinalysis dipstick     Status: Abnormal   Collection Time: 12/15/23  5:35 PM  Result Value Ref Range   Color, UA yellow yellow   Clarity, UA clear clear   Glucose, UA negative negative mg/dL   Bilirubin, UA negative negative   Ketones, POC UA negative negative mg/dL   Spec Grav, UA 9.562 1.308 - 1.025   Blood,  UA small (A) negative   pH, UA 7.0 5.0 - 8.0   Protein Ur, POC =30 (A) negative mg/dL   Urobilinogen, UA 1.0 0.2 or 1.0 E.U./dL   Nitrite, UA Negative Negative   Leukocytes, UA Negative Negative  Assessment and Plan :   PDMP not reviewed this encounter.  1. Sciatica of left side   2. Hematuria, unspecified type    Recommended patient hydrate more consistently.  Urine culture pending.  Will manage for sciatica given his general HPI, straight leg raise.  Counseled patient on potential for adverse effects with medications prescribed/recommended today, ER and return-to-clinic precautions discussed, patient verbalized understanding.    Wallis Bamberg, [place] 12/15/23 1754

## 2023-12-15 NOTE — Discharge Instructions (Addendum)
 Please start prednisone to address sciatica. Use cyclobenzaprine at night for a muscle relaxant. Make sure you hydrate very well with plain water and a quantity of 64-80 ounces of water a day.  Please limit drinks that are considered urinary irritants such as soda, sweet tea, coffee, energy drinks, alcohol.  I will let you know about your urine culture results through MyChart to see if we need to prescribe or change your antibiotics based off of those results.

## 2023-12-17 LAB — URINE CULTURE: Culture: NO GROWTH — AB

## 2024-01-02 NOTE — Unmapped (Signed)
 [facility] Refill Coordination Note    Specialty Medication(s) to be Shipped:   Infectious Disease: [name]     Other medication(s) to be shipped: No additional medications requested for fill at this time     Benjamin Ortiz, DOB: [birth year]  Phone: [phone] (home)       All above HIPAA information was verified with patient.     Was a Nurse, learning disability used for this call? No    Completed refill call assessment today to schedule patient's medication shipment from the [facility]  [phone]).  All relevant notes have been reviewed.     Specialty medication(s) and dose(s) confirmed: Regimen is correct and unchanged.   Changes to medications: Maksim reports no changes at this time.  Changes to insurance: No  New side effects reported not previously addressed with a pharmacist or physician: None reported  Questions for the pharmacist: No    Confirmed patient received a Conservation officer, historic buildings and a Surveyor, mining with first shipment. The patient will receive a drug information handout for each medication shipped and additional FDA Medication Guides as required.       DISEASE/MEDICATION-SPECIFIC INFORMATION        N/A    SPECIALTY MEDICATION ADHERENCE     Medication Adherence    Patient reported X missed doses in the last month: 0  Specialty Medication: [name]  50-300 mg Tab (dolutegravir-lamiVUDine)  Patient is on additional specialty medications: No              Were doses missed due to medication being on hold? No    [name]  50-300 mg Tab (dolutegravir-lamiVUDine): 5 days of medicine on hand       REFERRAL TO PHARMACIST     Referral to the pharmacist: Not needed      [facility]     Shipping address confirmed in Epic.     Cost and Payment: Patient has a $0 copay, payment information is not required.    Delivery Scheduled: Yes, Expected medication delivery date: 01/05/24.     Medication will be delivered via UPS to the prescription address in Epic WAM.    Channel Papandrea   [facility]  Specialty Technician

## 2024-01-04 MED FILL — DOVATO 50 MG-300 MG TABLET: ORAL | 30 days supply | Qty: 30 | Fill #2

## 2024-01-09 DIAGNOSIS — I5032 Chronic diastolic (congestive) heart failure: Principal | ICD-10-CM

## 2024-01-09 MED ORDER — NEBIVOLOL 20 MG TABLET
ORAL_TABLET | Freq: Every day | ORAL | 0 refills | 0.00 days
Start: 2024-01-09 — End: ?

## 2024-01-09 NOTE — Unmapped (Signed)
 Medication Requested: bystolic      Future Appointments   Date Time Provider Department Center   01/10/2024  1:00 PM Melva Stabile, MD [facility]     Per Provider Note:   This medication is included in this patients clinical summary.    Standing order protocol requirements met?: Yes     Sent to: Provider for signing    Days Supply Given: TBD by provider   Number of Refills: TBD by provider

## 2024-01-10 ENCOUNTER — Encounter
Admit: 2024-01-10 | Discharge: 2024-01-11 | Payer: BLUE CROSS/BLUE SHIELD | Attending: Student in an Organized Health Care Education/Training Program | Primary: Student in an Organized Health Care Education/Training Program

## 2024-01-10 DIAGNOSIS — Z21 Asymptomatic human immunodeficiency virus [HIV] infection status: Principal | ICD-10-CM

## 2024-01-10 MED ORDER — NEBIVOLOL 20 MG TABLET
ORAL_TABLET | Freq: Every day | ORAL | 0 refills | 90.00 days | Status: CP
Start: 2024-01-10 — End: ?

## 2024-01-10 NOTE — Unmapped (Signed)
 Infectious Diseases Clinic      The patient reports they are physically located in [place]  and is currently: at home. I conducted a audio/video visit. I spent  55m 10s on the video call with the patient. I spent an additional 25 minutes on pre- and post-visit activities on the date of service .       Assessment/Plan:     HIV     Doing great w/ regard to this. Current regimen: [name]  (3TC/DTG). Switched from Biktarvy  due to cost. Was on Atripla  prior to that, following dx in 2006. Confirmed reactive HepB serology (04/2021) c/w vaccination.     Misses doses of ARVs rarely     Med access through insurance  CD4 count over 300 for >2Y on suppressive ART; no prophylaxis needed; recheck CD4 annually  Discussed ARV adherence    Lab Results   Component Value Date    ACD4 1,288 10/11/2023    CD4TABS 960 05/16/2022    CD4 46 10/11/2023    CD4TCELL 40.0 05/16/2022    HIVRS Detected (A) 10/11/2023    HIVCP <20 (H) 10/11/2023       No labs today   Continue current therapy  Encouraged continued excellent ARV adherence    Etoh intake  Has cut back - now having bottle wine lasting 3d (rahter than 1-2). Still trying to cut back further. Commended on the progress he has made so far  - referral to [facility] clinic made, warm handoff    HTN  Poorly controlled at last visit. At that time pt reporting poor adherence due to cardiologist moving practices, didn't have access to three antihypertensives (Bystolic  20, amlodipine  10, Entresto  97-103 BID). These drugs were restarted after our last visit. Reports BP in 140s/80s on home cuff. HA improved.   - sent med refills  - has reached out to cards at [facility] health and he plans to schedule in May (they were booked)     NICM  HFrecEF  nonischemic dilated cardiomyopathy, coronary angiography on 06/30/2014 which revealed normal coronary arteries with ejection fraction of 35-40%, though recent echo 07/2022 with EF 55%, mild pulm regurg. EF now stable over last few years.  - per PCP/cards  - may require cards referral if cannot re-establish with [facility] (planned May 2025)    Severe OSA  Followed by neurology at [facility]. Adherent to cpap historically, not discussed 01/10/24    Hyperlipidemia  Followed by primary care [facility] and [facility] ([facility]). Prior TG 04/2021 were elevated, non-fasting. Were >700 back in 2021. Have encouraged f/u as above.  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 HRA 07/2019. Anal pap neg 04/2022  - anal pap on/after 04/2023, next visit (virtual visit 12/2023)    Mood/family challenges  Father is getting better - pt had worked with him to try and cut back on sweets for [place]. Pt reports he gave up sweets etc for 40 days and says he is feeling less groggy and generally better. Ongoing challenges with progressive dementia in mother but reports support in siblings and seems more at ease than during our last visit  - CTM    L Sciatica  Had an episode on 3/26 - woke up and had severe pain radiating up leg to buttock. Is taking tylenol - went to urgent care where  he was prescribed muscle relaxant, usually at night but hasn't needed last 4d  - previously discussed stretching/PT w/ online videos (youtube, Doctor Nancyann Aye- PT) - not discussed 01/10/24  - tyelnol ok to continue  - could consider PT.   - will consider additional imaging (xray), will reach out to PCP    Nicotine dependence  He reports that he quit smoking about 12 years ago. His smoking use included cigarettes. He has never used smokeless tobacco.      Sexual health & secondary prevention  Sex with cis men.  Last new sex ptnr in Nov 2024, not discussed 12/2023    He does routinely discuss status with partner(s). Sometimes uses condoms.      Lab Results   Component Value Date    RPR Reactive (A) 10/11/2023    RPR 1:4 10/11/2023    CTNAA Negative 10/11/2023    CTNAA Negative 10/11/2023    CTNAA Negative 10/11/2023    GCNAA Negative 10/11/2023    GCNAA Negative 10/11/2023    GCNAA Negative 10/11/2023    SPECSOURCE Rectum 10/11/2023    SPECSOURCE Throat 10/11/2023    SPECSOURCE Urine 10/11/2023       GC/CT NAATs --  virtual visit  RPR --  virtual visit      Health maintenance     Oral health  He does  have a dentist. Last dental exam was a few years ago at [facility]. Re-referred to [facility] 05/18/22, not discussed 09/2023    Eye health  He does not use corrective lenses. Last eye exam within last 12 months, through [facility]. Not discussed 12/2022    Metabolic conditions  Wt Readings from Last 5 Encounters:   10/11/23 93.9 kg (207 lb)   01/11/23 95.6 kg (210 lb 12.8 oz)   08/25/22 97.7 kg (215 lb 6.4 oz)   05/18/22 98.2 kg (216 lb 6.4 oz)   05/19/21 95.8 kg (211 lb 3.2 oz)     Lab Results   Component Value Date    CREATININE 0.84 10/11/2023    PROTEINUA Trace (A) 05/18/2022    PROTEINUR 6 01/22/2013    GLUCOSEU Negative 05/18/2022    PCRATIOUR 0.075 01/22/2013    GLU 101 10/11/2023    A1C 6.1 (H) 08/25/2022    ALT 32 10/11/2023    ALT 34 01/10/2023    ALT 37 05/16/2022     # Kidney health -  no labs virtual visit    # Bone health -  needs at next  - needs at next visit  # Diabetes assessment - defer mgm't to PCP  # NAFLD assessment - suspicion for NAFLD low    Communicable diseases  Lab Results   Component Value Date    HEPBSAB Reactive (A) 05/19/2021    HEPCAB Nonreactive 05/19/2021     # TB screening - assessment needed but deferred to future visit  # Hepatitis screening - no indication for screening  # MMR screening - not assessed    Cancer screening  Lab Results   Component Value Date    FINALDX  05/18/2022     A. Anal pap:  - Negative for intraepithelial lesion or malignancy (NILM)    This electronic signature is attestation that the pathologist personally reviewed the submitted material(s) and the final diagnosis reflects that evaluation.       # Cervical -  n/a  # Breast - no indication for screening at present    # Anorectal - repeat anal Pap 91m from  prior  # Colorectal - screening not indicated, start at 45   # Liver - no screening indicated  # Lung - screening not indicated  # Prostate - screening not indicated    Cardiovascular disease  Lab Results   Component Value Date    CHOL 215 (H) 10/11/2023    HDL 43 10/11/2023    LDL 99 10/11/2023    NONHDL 172 (H) 10/11/2023    TRIG 363 (H) 10/11/2023     # The 10-year ASCVD risk score (Arnett DK, et al., 2019) is: 10.5%  - is taking aspirin   - is taking statin  - BP control poor  - former smoker  # AAA screening - no indication for screening    Immunization History   Administered Date(s) Administered    COVID-19 VACC,MRNA,(PFIZER)(PF) 12/07/2019, 12/28/2019, 08/08/2020    Covid-19 Vac, (64yr+) (Spikevax) Monovalent Moderna 08/25/2022    HEPATITIS B VACCINE ADULT,IM(ENERGIX B, RECOMBIVAX) 04/08/2017, 06/09/2017, 01/16/2018    Hepatitis A (Adult) 04/24/2007, 04/08/2008    Hepatitis B Vaccine, Unspecified Formulation 07/07/1994, 08/18/1994, 01/12/1995    INFLUENZA VACCINE IIV3(IM)(PF)6 MOS UP 10/11/2023    Influenza Vaccine Quad (IIV4 PF) 6-40mo 06/11/2022    Influenza Vaccine Quad(IM)6 MO-Adult(PF) 05/30/2019, 06/02/2020    Influenza Virus Vaccine, unspecified formulation 06/29/2015, 05/27/2019    PNEUMOCOCCAL POLYSACCHARIDE 23-VALENT 04/12/2005, 02/22/2011    PPD Test 04/08/2008, 06/30/2009, 10/06/2009    Pneumococcal Conjugate 13-Valent 02/04/2014    SHINGRIX-ZOSTER VACCINE (HZV),RECOMBINANT,ADJUVANTED(IM) 01/11/2023    SMALLPOX,MPOX(PF)(JYNNEOS ) 05/19/2021    TD(TDVAX),ADSORBED,2LF(IM)(PF) 12/22/2015    TdaP 03/14/2006, 04/05/2016, 04/08/2017       Immunizations today -  none    I personally spent 39 minutes face-to-face and non-face-to-face in the care of this patient, which includes all pre, intra, and post visit time on the date of service.  All documented time was specific to the E/M visit and does not include any procedures that may have been performed.    Pt seen and evaluated with Dr. Duggan.    Disposition    Return to clinic 5-6 months or sooner if needed.    To do @ next RTC  F/u on Alcohol use, referral  BP/cards  Lipids   Bone health labs  PCP f/u (sciatica)                Chief Complaint   Follow-up HIV      HPI  In addition to details in A&P above:    See above. No reported missed HIV meds    Past Medical History:   Diagnosis Date    Abnormal electrocardiogram 05/09/2014    HIV disease     HTN (hypertension) 01/22/2013    Malignant hypertension 05/30/2014    Pain, dental 07/23/2013         Social History  Housing - in apt in [place] with his brother    School / Work - Not in school. Employed Public relations account executive)    Tobacco - He reports that he quit smoking about 12 years ago. His smoking use included cigarettes. He has never used smokeless tobacco.   Alcohol -  6-7 day(s) per week, 2-3 drink(s) per sitting,     Substance use - none      Review of Systems  As per HPI. All others negative.      Medications and Allergies  He has a current medication list which includes the following prescription(s): allopurinol, amlodipine , ascorbic acid (vitamin c), blood pressure test kit-large, bystolic , dovato , doxycycline , ibuprofen, omega-3 acid  ethyl esters, rosuvastatin , sacubitril -valsartan , and trazodone.    Allergies: Enalapril      Family History  His family history includes Diabetes in his father; Heart disease in his father; Rheumatologic disease in his mother.                   There were no vitals taken for this visit.     Virtual visit  Appears well, speaking in full sentences,

## 2024-02-01 NOTE — Unmapped (Signed)
 [facility] Refill Coordination Note    Specialty Medication(s) to be Shipped:   Infectious Disease: [name]    Other medication(s) to be shipped: No additional medications requested for fill at this time     Benjamin Ortiz, DOB: 04/12/1981  Phone: [phone] (home)       All above HIPAA information was verified with patient.     Was a Nurse, learning disability used for this call? No    Completed refill call assessment today to schedule patient's medication shipment from the [facility]  [phone]).  All relevant notes have been reviewed.     Specialty medication(s) and dose(s) confirmed: Regimen is correct and unchanged.   Changes to medications: Benjamin Ortiz reports no changes at this time.  Changes to insurance: No  New side effects reported not previously addressed with a pharmacist or physician: None reported  Questions for the pharmacist: No    Confirmed patient received a Conservation officer, historic buildings and a Surveyor, mining with first shipment. The patient will receive a drug information handout for each medication shipped and additional FDA Medication Guides as required.       DISEASE/MEDICATION-SPECIFIC INFORMATION        N/A    SPECIALTY MEDICATION ADHERENCE     Medication Adherence    Patient reported X missed doses in the last month: 0  Specialty Medication: [name] 50-300 mg Tab (dolutegravir-lamiVUDine)  Patient is on additional specialty medications: No              Were doses missed due to medication being on hold? No     [name] 50-300 mg Tab (dolutegravir-lamiVUDine): 6 days of medicine on hand       REFERRAL TO PHARMACIST     Referral to the pharmacist: Not needed      [facility]     Shipping address confirmed in Epic.     Cost and Payment: Patient has a $0 copay, payment information is not required.    Delivery Scheduled: Yes, Expected medication delivery date: 02/06/2024.     Medication will be delivered via UPS to the prescription address in Epic WAM.    Benjamin Ortiz   [facility]  Specialty Technician

## 2024-02-05 MED FILL — DOVATO 50 MG-300 MG TABLET: ORAL | 30 days supply | Qty: 30 | Fill #3

## 2024-03-01 DIAGNOSIS — B20 Human immunodeficiency virus [HIV] disease: Principal | ICD-10-CM

## 2024-03-01 MED ORDER — DOVATO 50 MG-300 MG TABLET
ORAL_TABLET | Freq: Every day | ORAL | 5 refills | 30.00000 days | Status: CP
Start: 2024-03-01 — End: ?
  Filled 2024-03-04: qty 30, 30d supply, fill #0

## 2024-03-01 NOTE — Unmapped (Signed)
 [facility] Specialty and Home Delivery Pharmacy Clinical Assessment & Refill Coordination Note    Benjamin Ortiz, DOB: 1981/02/05  Phone: [phone] (home)     All above HIPAA information was verified with patient.     Was a Nurse, learning disability used for this call? No    Specialty Medication(s):   Infectious Disease: [name]     Current Medications[1]     Changes to medications: Benjamin Ortiz reports no changes at this time.    Medication list has been reviewed and updated in Epic: Yes    Allergies[2]    Changes to allergies: No    Allergies have been reviewed and updated in Epic: Yes    SPECIALTY MEDICATION ADHERENCE     [name] 50-300 mg: 6 days of medicine on hand         Medication Adherence    Patient reported X missed doses in the last month: 0  Specialty Medication: [name] 50-300 mg Tab (dolutegravir-lamiVUDine)  Patient is on additional specialty medications: No  Informant: patient          Specialty medication(s) dose(s) confirmed: Regimen is correct and unchanged.     Are there any concerns with adherence? No    Adherence counseling provided? Not needed    CLINICAL MANAGEMENT AND INTERVENTION      Clinical Benefit Assessment:    Do you feel the medicine is effective or helping your condition? Yes    Clinical Benefit counseling provided? Labs from 09/2023 show evidence of clinical benefit    Adverse Effects Assessment:    Are you experiencing any side effects? No    Are you experiencing difficulty administering your medicine? No    Quality of Life Assessment:    Quality of Life    Rheumatology  Oncology  Dermatology  Cystic Fibrosis          How many days over the past month did your HIV  keep you from your normal activities? For example, brushing your teeth or getting up in the morning. Patient declined to answer    Have you discussed this with your provider? Not needed    Acute Infection Status:    Acute infections noted within Epic:  No active infections    Patient reported infection: None    Therapy Appropriateness:    Is therapy appropriate based on current medication list, adverse reactions, adherence, clinical benefit and progress toward achieving therapeutic goals? Yes, therapy is appropriate and should be continued     Clinical Intervention:    Was an intervention completed as part of this clinical assessment? No    DISEASE/MEDICATION-SPECIFIC INFORMATION      N/A    HIV: Not Applicable    HIV ASSOCIATED LABS:     Lab Results   Component Value Date/Time    HIVRS Detected (A) 10/11/2023 04:49 PM    HIVRS Detected (A) 05/19/2021 04:43 PM    HIVRS Not Detected 06/02/2020 11:03 AM    HIVRS Not Detected 08/19/2014 12:24 PM    HIVRS Not Detected 02/04/2014 11:08 AM    HIVRS Detected 01/22/2013 02:27 PM    HIVCP <20 (H) 10/11/2023 04:49 PM    HIVCP <20 01/10/2023 11:09 AM    HIVCP <40 05/16/2022 10:30 AM    HIVCP <40 (H) 05/19/2021 04:43 PM    HIVCP <40 12/18/2019 12:01 PM    ACD4 1,288 10/11/2023 04:49 PM    ACD4 1,269 05/19/2021 04:43 PM    ACD4 980 06/02/2020 11:03 AM    ACD4 860 08/19/2014  12:24 PM    ACD4 802 02/04/2014 11:08 AM    ACD4 1,143 01/22/2013 02:27 PM         PATIENT SPECIFIC NEEDS     Does the patient have any physical, cognitive, or cultural barriers? No    Is the patient high risk? No    Does the patient require physician intervention or other additional services (i.e., nutrition, smoking cessation, social work)? No    Does the patient have an additional or emergency contact listed in their chart? Yes    SOCIAL DETERMINANTS OF HEALTH     At the [facility], we have learned that life circumstances - like trouble affording food, housing, utilities, or transportation can affect the health of many of our patients.   That is why we wanted to ask: are you currently experiencing any life circumstances that are negatively impacting your health and/or quality of life? Patient declined to answer    Social Drivers of Health     Food Insecurity: Not on file   Tobacco Use: Medium Risk (12/15/2023)    Received from [facility] Patient History     Smoking Tobacco Use: Former     Smokeless Tobacco Use: Never     Passive Exposure: Not on file   Transportation Needs: Not on file   Alcohol Use: Not on file   Housing: Not on file   Physical Activity: Not on file   Utilities: Not on file   Stress: Not on file   Interpersonal Safety: Not on file   Substance Use: Not on file (07/30/2023)   Intimate Partner Violence: Not on file   Social Connections: Not on file   Financial Resource Strain: Not on file   Health Literacy: Not on file   Internet Connectivity: Not on file       Would you be willing to receive help with any of the needs that you have identified today? Not applicable       SHIPPING     Specialty Medication(s) to be Shipped:   Infectious Disease: [name]    Other medication(s) to be shipped: No additional medications requested for fill at this time     Changes to insurance: No    Cost and Payment: Patient has a $0 copay, payment information is not required.    Delivery Scheduled: Yes, Expected medication delivery date: 03/05/24.     Medication will be delivered via UPS to the confirmed prescription address in [facility].    The patient will receive a drug information handout for each medication shipped and additional FDA Medication Guides as required.  Verified that patient has previously received a Conservation officer, historic buildings and a Surveyor, mining.    The patient or caregiver noted above participated in the development of this care plan and knows that they can request review of or adjustments to the care plan at any time.      All of the patient's questions and concerns have been addressed.    Benjamin Ortiz, PharmD   [facility] Specialty and Home Delivery Pharmacy Specialty Pharmacist       [1]   Current Outpatient Medications   Medication Sig Dispense Refill    allopurinol (ZYLOPRIM) 100 MG tablet Take 1 tablet (100 mg total) by mouth. (Patient not taking: Reported on 10/11/2023)      amlodipine (NORVASC) 10 MG tablet Take 1 tablet by mouth once daily 90 tablet 0    ascorbic acid, vitamin C, (VITAMIN C) 1000 MG tablet  Take 1 tablet (1,000 mg total) by mouth.      blood pressure test kit-large Kit 1 mL by Miscellaneous route once daily. 1 each 0    dolutegravir-lamiVUDine ([name]) 50-300 mg Tab Take 1 tablet by mouth daily. 30 tablet 3    doxycycline (MONODOX) 100 MG capsule Take 2 capsules (200 mg total) by mouth once as needed (within 72 hours (3 days) after sex) for up to 1 dose. Take within 72 hours (3 days) after sex, ideally within 24 hours. Take no more than 200 mg in a 24 hour period. 30 capsule 0    ibuprofen (ADVIL,MOTRIN) 200 MG tablet Take 2 tablets (400 mg total) by mouth.      nebivolol (BYSTOLIC) 20 mg Tab Take 1 tablet by mouth once daily 90 tablet 0    omega-3 acid ethyl esters (LOVAZA) 1 gram capsule Take 2 capsules (2 g total) by mouth two (2) times a day.      rosuvastatin (CRESTOR) 20 MG tablet Take 1 tablet (20 mg total) by mouth in the morning. 90 tablet 3    sacubitril-valsartan (ENTRESTO) 97-103 mg tablet Take 1 tablet by mouth two (2) times a day. 180 tablet 0    traZODone (DESYREL) 50 MG tablet Take 0.5 tablets (25 mg total) by mouth. (Patient not taking: Reported on 10/11/2023)       No current facility-administered medications for this visit.   [2]   Allergies  Allergen Reactions    Enalapril Cough

## 2024-03-01 NOTE — Unmapped (Signed)
 Medication Requested: dolutegravir-lamiVUDine ([name]) 50-300 mg Tab       No future appointments. PAC calling to schedule  Per Provider Note: HIV     Doing great w/ regard to this. Current regimen: [name] (3TC/DTG). Switched from [place] due to cost. Was on Atripla prior to that, following dx in 2006. Confirmed reactive HepB serology (04/2021) c/w vaccination.    Standing order protocol requirements met?: No    Sent to: Provider for signing    Days Supply Given: TBD  Number of Refills: TBD

## 2024-03-23 DIAGNOSIS — I5032 Chronic diastolic (congestive) heart failure: Principal | ICD-10-CM

## 2024-03-23 MED ORDER — AMLODIPINE 10 MG TABLET
ORAL_TABLET | Freq: Every day | ORAL | 0 refills | 0.00000 days
Start: 2024-03-23 — End: ?

## 2024-03-25 MED ORDER — AMLODIPINE 10 MG TABLET
ORAL_TABLET | Freq: Every day | ORAL | 0 refills | 90.00000 days | Status: CP
Start: 2024-03-25 — End: ?

## 2024-03-25 NOTE — Unmapped (Signed)
 Medication Requested: amlodipine       No future appointments.  Per Provider Note:   This medication is included in this patients clinical summary.    Standing order protocol requirements met?: No    Sent to: Provider for signing    Days Supply Given:   Number of Refills:

## 2024-03-25 NOTE — Unmapped (Signed)
 Hi Ajanee, do they need to be seen before September that is in Dr. Moira visit note?    Thanks,  NIKE

## 2024-03-29 NOTE — Unmapped (Signed)
 [facility] Specialty and Home Delivery Pharmacy Refill Coordination Note    Specialty Medication(s) to be Shipped:   Infectious Disease: [name]     Other medication(s) to be shipped: No additional medications requested for fill at this time     Benjamin Ortiz, DOB: Feb 14, 1981  Phone: [phone] (home)       All above HIPAA information was verified with patient.     Was a Nurse, learning disability used for this call? No    Completed refill call assessment today to schedule patient's medication shipment from the [facility] and Home Delivery Pharmacy  [phone]).  All relevant notes have been reviewed.     Specialty medication(s) and dose(s) confirmed: Regimen is correct and unchanged.   Changes to medications: [name] reports no changes at this time.  Changes to insurance: No  New side effects reported not previously addressed with a pharmacist or physician: None reported  Questions for the pharmacist: No    Confirmed patient received a Conservation officer, historic buildings and a Surveyor, mining with first shipment. The patient will receive a drug information handout for each medication shipped and additional FDA Medication Guides as required.       DISEASE/MEDICATION-SPECIFIC INFORMATION        N/A    SPECIALTY MEDICATION ADHERENCE     Medication Adherence    Patient reported X missed doses in the last month: 0  Specialty Medication: [name]  50-300 mg Tab (dolutegravir-lamiVUDine)  Patient is on additional specialty medications: No              Were doses missed due to medication being on hold? No     [name]  50-300 mg Tab (dolutegravir-lamiVUDine): 6 days of medicine on hand       REFERRAL TO PHARMACIST     Referral to the pharmacist: Not needed      [facility]     Shipping address confirmed in Epic.     Cost and Payment: Patient has a $0 copay, payment information is not required.    Delivery Scheduled: Yes, Expected medication delivery date: 04/03/2024.     Medication will be delivered via UPS to the prescription address in Epic WAM.    Benjamin Ortiz   [facility] Specialty and Home Delivery Pharmacy  Specialty Technician

## 2024-04-02 MED FILL — DOVATO 50 MG-300 MG TABLET: ORAL | 30 days supply | Qty: 30 | Fill #1

## 2024-04-12 DIAGNOSIS — I5032 Chronic diastolic (congestive) heart failure: Principal | ICD-10-CM

## 2024-04-12 MED ORDER — NEBIVOLOL 20 MG TABLET
ORAL_TABLET | Freq: Every day | ORAL | 0 refills | 90.00000 days | Status: CP
Start: 2024-04-12 — End: ?

## 2024-04-12 NOTE — Unmapped (Signed)
 Medication Requested: nebivolol  (BYSTOLIC ) 20 mg Tab       No future appointments.  Per Provider Note: in med list    Standing order protocol requirements met?: No    Sent to: Provider for signing    Days Supply Given: TBD  Number of Refills: TBD

## 2024-04-22 NOTE — Unmapped (Signed)
 [facility] Specialty and Home Delivery Pharmacy Refill Coordination Note    Specialty Medication(s) to be Shipped:   Infectious Disease: [name]     Other medication(s) to be shipped: No additional medications requested for fill at this time     Benjamin Ortiz, DOB: Aug 17, 1981  Phone: [phone] (home)       All above HIPAA information was verified with patient.     Was a Nurse, learning disability used for this call? No    Completed refill call assessment today to schedule patient's medication shipment from the [facility] and Home Delivery Pharmacy  [phone]).  All relevant notes have been reviewed.     Specialty medication(s) and dose(s) confirmed: Regimen is correct and unchanged.   Changes to medications: Benjamin Ortiz reports no changes at this time.  Changes to insurance: No  New side effects reported not previously addressed with a pharmacist or physician: None reported  Questions for the pharmacist: No    Confirmed patient received a Conservation officer, historic buildings and a Surveyor, mining with first shipment. The patient will receive a drug information handout for each medication shipped and additional FDA Medication Guides as required.       DISEASE/MEDICATION-SPECIFIC INFORMATION        N/A    SPECIALTY MEDICATION ADHERENCE     Medication Adherence    Patient reported X missed doses in the last month: 0  Specialty Medication: [name]  50-300 mg Tab (dolutegravir-lamiVUDine)  Patient is on additional specialty medications: No              Were doses missed due to medication being on hold? No     [name]  50-300 mg Tab (dolutegravir-lamiVUDine): 6 days of medicine on hand       REFERRAL TO PHARMACIST     Referral to the pharmacist: Not needed      [facility]     Shipping address confirmed in Epic.     Cost and Payment: Patient has a $0 copay, payment information is not required.      Delivery Scheduled: Yes, Expected medication delivery date: 04/26/2024.     Medication will be delivered via UPS to the prescription address in Epic WAM.    Benjamin Ortiz   [facility] Specialty and Home Delivery Pharmacy  Specialty Technician

## 2024-04-25 MED FILL — DOVATO 50 MG-300 MG TABLET: ORAL | 30 days supply | Qty: 30 | Fill #2

## 2024-05-17 NOTE — Unmapped (Signed)
 [facility] Specialty and Home Delivery Pharmacy Refill Coordination Note    Specialty Medication(s) to be Shipped:   Infectious Disease: [name]     Other medication(s) to be shipped: No additional medications requested for fill at this time    Specialty Medications not needed at this time: N/A     Benjamin Ortiz, DOB: 1980/12/01  Phone: [phone] (home)       All above HIPAA information was verified with patient.     Was a Nurse, learning disability used for this call? No    Completed refill call assessment today to schedule patient's medication shipment from the [facility] and Home Delivery Pharmacy  [phone]).  All relevant notes have been reviewed.     Specialty medication(s) and dose(s) confirmed: Regimen is correct and unchanged.   Changes to medications: Benjamin Ortiz reports no changes at this time.  Changes to insurance: No  New side effects reported not previously addressed with a pharmacist or physician: None reported  Questions for the pharmacist: No    Confirmed patient received a Conservation officer, historic buildings and a Surveyor, mining with first shipment. The patient will receive a drug information handout for each medication shipped and additional FDA Medication Guides as required.       DISEASE/MEDICATION-SPECIFIC INFORMATION        N/A    SPECIALTY MEDICATION ADHERENCE     Medication Adherence    Patient reported X missed doses in the last month: 0  Specialty Medication: dolutegravir-lamiVUDine ([name] ) 50-300 mg Tab  Patient is on additional specialty medications: No              Were doses missed due to medication being on hold? No      [name]  50-300 mg Tab (dolutegravir-lamiVUDine): 5 days of medicine on hand        REFERRAL TO PHARMACIST     Referral to the pharmacist: Not needed      [facility]     Shipping address confirmed in Epic.     Cost and Payment: Patient has a $0 copay, payment information is not required.    Delivery Scheduled: Yes, Expected medication delivery date: 9.3.25.     Medication will be delivered via UPS to the prescription address in Epic WAM.    Benjamin Ortiz   [facility] Specialty and Home Delivery Pharmacy  Specialty Technician

## 2024-05-21 MED FILL — DOVATO 50 MG-300 MG TABLET: ORAL | 30 days supply | Qty: 30 | Fill #3

## 2024-06-12 NOTE — Unmapped (Signed)
 [facility] Specialty and Home Delivery Pharmacy Refill Coordination Note    Specialty Medication(s) to be Shipped:   Infectious Disease: [name]  50-300 mg Tab (dolutegravir-lamiVUDine)    Other medication(s) to be shipped: No additional medications requested for fill at this time    Specialty Medications not needed at this time: N/A     Benjamin Ortiz, DOB: [birth year]  Phone: [phone] (home)       All above HIPAA information was verified with patient.     Was a Nurse, learning disability used for this call? No    Completed refill call assessment today to schedule patient's medication shipment from the [facility] and Home Delivery Pharmacy  [phone]).  All relevant notes have been reviewed.     Specialty medication(s) and dose(s) confirmed: Regimen is correct and unchanged.   Changes to medications: Jovante reports no changes at this time.  Changes to insurance: No  New side effects reported not previously addressed with a pharmacist or physician: None reported  Questions for the pharmacist: No    Confirmed patient received a Conservation officer, historic buildings and a Surveyor, mining with first shipment. The patient will receive a drug information handout for each medication shipped and additional FDA Medication Guides as required.       DISEASE/MEDICATION-SPECIFIC INFORMATION        N/A    SPECIALTY MEDICATION ADHERENCE     Medication Adherence    Patient reported X missed doses in the last month: 0  Specialty Medication: [name]  50-300 mg Tab (dolutegravir-lamiVUDine)  Patient is on additional specialty medications: No  Informant: patient              Were doses missed due to medication being on hold? No    [name]  50-300 mg Tab (dolutegravir-lamiVUDine): 6 days of medicine on hand       REFERRAL TO PHARMACIST     Referral to the pharmacist: Not needed      [facility]     Shipping address confirmed in Epic.     Cost and Payment: Patient has a $0 copay, payment information is not required.    Delivery Scheduled: Yes, Expected medication delivery date: 9/26.     Medication will be delivered via UPS to the prescription address in Epic WAM.    Jeoffrey JAYSON Sherra UNK Specialty and [facility]

## 2024-06-13 MED FILL — DOVATO 50 MG-300 MG TABLET: ORAL | 30 days supply | Qty: 30 | Fill #4

## 2024-06-25 DIAGNOSIS — I5020 Unspecified systolic (congestive) heart failure: Principal | ICD-10-CM

## 2024-06-25 MED ORDER — AMLODIPINE 10 MG TABLET
ORAL_TABLET | Freq: Every day | ORAL | 1 refills | 30.00000 days | Status: CP
Start: 2024-06-25 — End: ?

## 2024-06-25 NOTE — Unmapped (Signed)
 Medication Requested: amlodipine  (NORVASC ) 10 MG tablet       No future appointments. PAC calling to schedule  Per Provider Note: in med list    Standing order protocol requirements met?: Yes    Sent to: Pharmacy per protocol    Days Supply Given: 30 days  Number of Refills: 1

## 2024-06-25 NOTE — Unmapped (Signed)
 VM is full sent text message to reschedule with Dr. Eilene.

## 2024-06-28 ENCOUNTER — Other Ambulatory Visit: Payer: Self-pay | Admitting: *Deleted

## 2024-06-28 DIAGNOSIS — Z006 Encounter for examination for normal comparison and control in clinical research program: Secondary | ICD-10-CM

## 2024-07-08 NOTE — Unmapped (Signed)
 [facility] Specialty and Home Delivery Pharmacy Refill Coordination Note    Specialty Medication(s) to be Shipped:   Infectious Disease: [name]  50-300 mg Tab (dolutegravir-lamiVUDine)    Other medication(s) to be shipped: No additional medications requested for fill at this time    Specialty Medications not needed at this time: N/A     Benjamin Ortiz, DOB: Oct 12, 1980  Phone: [phone] (home)       All above HIPAA information was verified with patient.     Was a Nurse, learning disability used for this call? No    Completed refill call assessment today to schedule patient's medication shipment from the [facility] and Home Delivery Pharmacy  [phone]).  All relevant notes have been reviewed.     Specialty medication(s) and dose(s) confirmed: Regimen is correct and unchanged.   Changes to medications: [name] reports no changes at this time.  Changes to insurance: No  New side effects reported not previously addressed with a pharmacist or physician: None reported  Questions for the pharmacist: No    Confirmed patient received a Conservation officer, historic buildings and a Surveyor, mining with first shipment. The patient will receive a drug information handout for each medication shipped and additional FDA Medication Guides as required.       DISEASE/MEDICATION-SPECIFIC INFORMATION        N/A    SPECIALTY MEDICATION ADHERENCE     Medication Adherence    Patient reported X missed doses in the last month: 0  Specialty Medication: [name]  50-300 mg Tab (dolutegravir-lamiVUDine)  Patient is on additional specialty medications: No  Informant: patient              Were doses missed due to medication being on hold? No    [name]  50-300 mg Tab (dolutegravir-lamiVUDine) : 6 days of medicine on hand       REFERRAL TO PHARMACIST     Referral to the pharmacist: Not needed      [facility]     Shipping address confirmed in Epic.     Cost and Payment: Patient has a $0 copay, payment information is not required.    Delivery Scheduled: Yes, Expected medication delivery date: 10/23.     Medication will be delivered via UPS to the prescription address in Epic WAM.    Benjamin Ortiz Specialty and [facility]

## 2024-07-10 MED FILL — DOVATO 50 MG-300 MG TABLET: ORAL | 30 days supply | Qty: 30 | Fill #5

## 2024-07-11 NOTE — Progress Notes (Signed)
 Attempted to call patient for patient's re-engagement in care. Could not leave voicemail due to voicemail box being full.     [facility] Care Plan: Out of Care Patient (>6 months)    Patient's last ID clinic appt: 01/10/2024    Goal: Patient will have an office visit within 6 months of the last office visit unless stated otherwise by their provider.     Contact Interventions may include:  Placing phone calls to the patient and/or listed contacts.  Sending the patient an Out of Care text message through the [employer] or [facility] cell phone (if texting agreement is signed).  Sending the patient an Out of Care MyChart/letter.  Contacting the patient's most recent pharmacies as needed for additional information such as contact information or refill history.  Contacting the patient's case manager/caregiver as needed for additional information, if assigned.  Researching other online resources as needed.     If the patient is reached, the [employer] will attempt to:  Update all contact information including primary and secondary phone numbers and emergency contact details and address. Confirm access to MyChart.  Complete the Out of Care Intervention questionnaire with the patient.  Explore and discuss any barriers to visit attendance the patient may be experiencing that result in missed appointments, no-shows, cancellations, or lack of a scheduled follow-up appointment. These may include communication barriers (language, phone access, texting, MyChart), transportation, financial concerns, time off work, mental health/substance use, and/or multiple medical appointments.  Make appropriate referrals and link the patient to clinic nurses, social workers, benefits counselors, or other support services in the [facility] ID clinic to address concerns or barriers to care.  Schedule a follow-up appointment at an appropriate interval based on a completed assessment.  Confirm the patient has access to ART medications until that appointment. If the patient is not currently taking ART, needs refills, or is having difficulty taking the medication, refer to the clinical staff for follow up via [employer].    Monitor attendance of the patient's scheduled appointment and provide reminder calls/texts/messages consistent with patient preference, if needed.  Continue follow-up if indicated.    Expected Outcome:  Patient will have an office visit within 6 months of the last office visit unless stated otherwise by their provider.    If the patient has an urgent medical problem send a chat message to the clinic nursing staff for immediate follow-up     **If the patient is not able to be located or retained in HIV care, a referral will be placed to the [facility] HIV [facility] Counselor for further follow-up.    Duration of Service: 10 minutes    Benjamin Ortiz  Linkage and [facility]

## 2024-07-28 DIAGNOSIS — I5020 Unspecified systolic (congestive) heart failure: Principal | ICD-10-CM

## 2024-07-28 MED ORDER — NEBIVOLOL 20 MG TABLET
ORAL_TABLET | Freq: Every day | ORAL | 0 refills | 0.00000 days
Start: 2024-07-28 — End: ?

## 2024-07-29 MED ORDER — NEBIVOLOL 20 MG TABLET
ORAL_TABLET | Freq: Every day | ORAL | 0 refills | 30.00000 days | Status: CP
Start: 2024-07-29 — End: ?

## 2024-07-29 NOTE — Telephone Encounter (Signed)
 VM was full, sent text to ask patient to call to schedule patient with Dr. Eilene.

## 2024-07-29 NOTE — Telephone Encounter (Signed)
 Medication Requested: nebivolol  (BYSTOLIC ) 20 mg Tab       No future appointments. PAC calling to schedule  Per Provider Note: in med list    Standing order protocol requirements met?: Yes    Sent to: Pharmacy per protocol    Days Supply Given: 30 days  Number of Refills: 0

## 2024-08-02 DIAGNOSIS — B20 Human immunodeficiency virus [HIV] disease: Principal | ICD-10-CM

## 2024-08-02 MED ORDER — DOVATO 50 MG-300 MG TABLET
ORAL_TABLET | Freq: Every day | ORAL | 5 refills | 30.00000 days
Start: 2024-08-02 — End: ?

## 2024-08-02 NOTE — Progress Notes (Signed)
 [facility] Specialty and Home Delivery Pharmacy Refill Coordination Note    Specialty Medication(s) to be Shipped:   Infectious Disease: [name]  50-300 mg Tab (dolutegravir-lamiVUDine)    Other medication(s) to be shipped: No additional medications requested for fill at this time    Specialty Medications not needed at this time: N/A     Benjamin Ortiz, DOB: November 08, 1980  Phone: [phone] (home)       All above HIPAA information was verified with patient.     Was a nurse, learning disability used for this call? No    Completed refill call assessment today to schedule patient's medication shipment from the [facility] and [facility] Delivery Pharm[facility]  [phone]).  All relevant notes have been reviewed.     Specialty medication(s) and dose(s) confirmed: Regimen is correct and unchanged.   Changes to medications: Benjamin Ortiz reports no changes at this time.  Changes to insurance: No  New side effects reported not previously addressed with a pharmacist or physician: None reported  Questions for the pharmacist: No    Confirmed patient received a Conservation Officer, Historic Buildings and a Surveyor, Mining with first shipment. The patient will receive a drug information handout for each medication shipped and additional FDA Medication Guides as required.       DISEASE/MEDICATION-SPECIFIC INFORMATION        N/A    SPECIALTY MEDICATION ADHERENCE     Medication Adherence    Patient reported X missed doses in the last month: 0  Specialty Medication: [name]  50-300 mg Tab (dolutegravir-lamiVUDine)  Patient is on additional specialty medications: No  Informant: patient              Were doses missed due to medication being on hold? No    [name]  50-300 mg Tab (dolutegravir-lamiVUDine) : 5-7 days of medicine on hand   \    REFERRAL TO PHARMACIST     Referral to the pharmacist: Not needed      [facility]     Shipping address confirmed in Epic.     Cost and Payment: Patient has a $0 copay, payment information is not required.    Delivery Scheduled: Yes, Expected medication delivery date: 11/19.     Medication will be delivered via UPS to the prescription address in Epic WAM.    Benjamin Ortiz [facility] and [facility]

## 2024-08-06 NOTE — Progress Notes (Signed)
 Beverley Marsa Edison 's [name]  shipment will be canceled as a result of refills denied by provider     I have reached out to the patient  at [phone] and was unable to leave a message. We will not reschedule the medication and have removed this/these medication(s) from the work request.  We have canceled this work request.

## 2024-08-12 DIAGNOSIS — B20 Human immunodeficiency virus [HIV] disease: Principal | ICD-10-CM

## 2024-08-12 MED ORDER — DOVATO 50 MG-300 MG TABLET
ORAL_TABLET | Freq: Every day | ORAL | 5 refills | 30.00000 days | Status: CP
Start: 2024-08-12 — End: ?
  Filled 2024-08-20: qty 30, 30d supply, fill #0

## 2024-08-19 NOTE — Progress Notes (Signed)
 [facility] Specialty and Home Delivery Pharmacy Refill Coordination Note    Specialty Medication(s) to be Shipped:   Infectious Disease: [name]     Other medication(s) to be shipped: No additional medications requested for fill at this time    Specialty Medications not needed at this time: N/A     Benjamin Ortiz, DOB: Dec 29, 1980  Phone: [phone] (home)       All above HIPAA information was verified with patient.     Was a nurse, learning disability used for this call? No    Completed refill call assessment today to schedule patient's medication shipment from the [facility] and [facility] Delivery Pharmacy  [phone]).  All relevant notes have been reviewed.     Specialty medication(s) and dose(s) confirmed: Regimen is correct and unchanged.   Changes to medications: [name] reports no changes at this time.  Changes to insurance: No  New side effects reported not previously addressed with a pharmacist or physician: None reported  Questions for the pharmacist: No    Confirmed patient received a Conservation Officer, Historic Buildings and a Surveyor, Mining with first shipment. The patient will receive a drug information handout for each medication shipped and additional FDA Medication Guides as required.       DISEASE/MEDICATION-SPECIFIC INFORMATION        N/A    SPECIALTY MEDICATION ADHERENCE     Medication Adherence    Patient reported X missed doses in the last month: 0  Specialty Medication: [name]  50-300mg  QD  Patient is on additional specialty medications: No              Were doses missed due to medication being on hold? No    [name]  50-300 mg: 5 days of medicine on hand        REFERRAL TO PHARMACIST     Referral to the pharmacist: Not needed      [facility]     Shipping address confirmed in Epic.     Cost and Payment: Patient has a $0 copay, payment information is not required.    Delivery Scheduled: Yes, Expected medication delivery date: 08/21/24.     Medication will be delivered via UPS to the prescription address in Epic WAM.    Kelly CHRISTELLA Eagles   [facility] Specialty and Home Delivery Pharmacy  Specialty Technician

## 2024-08-23 DIAGNOSIS — I5020 Unspecified systolic (congestive) heart failure: Principal | ICD-10-CM

## 2024-08-23 MED ORDER — NEBIVOLOL 20 MG TABLET
ORAL_TABLET | Freq: Every day | ORAL | 0 refills | 90.00000 days | Status: CP
Start: 2024-08-23 — End: ?

## 2024-08-23 NOTE — Telephone Encounter (Signed)
 Medication Requested: nebivolol  (BYSTOLIC ) 20 mg Tab       Future Appointments   Date Time Provider Department Center   10/30/2024  1:00 PM Eilene Lauraine Norris, MD [facility]     Per Provider Note: on Bystolic  20 for hyprtension    Standing order protocol requirements met?: Yes    Sent to: Pharmacy per protocol    Days Supply Given: 90 days  Number of Refills: 0

## 2024-08-24 DIAGNOSIS — I5020 Unspecified systolic (congestive) heart failure: Principal | ICD-10-CM

## 2024-08-24 MED ORDER — AMLODIPINE 10 MG TABLET
ORAL_TABLET | Freq: Every day | ORAL | 0 refills | 0.00000 days
Start: 2024-08-24 — End: ?

## 2024-08-26 MED ORDER — AMLODIPINE 10 MG TABLET
ORAL_TABLET | Freq: Every day | ORAL | 0 refills | 30.00000 days | Status: CP
Start: 2024-08-26 — End: ?

## 2024-08-26 NOTE — Telephone Encounter (Signed)
 Medication Requested: amlodipine       Future Appointments   Date Time Provider Department Center   10/30/2024  1:00 PM Eilene Lauraine Norris, MD [facility]     Per Provider Note:     Standing order protocol requirements met?: No    Sent to: Provider for signing    Days Supply Given: 30 days  Number of Refills: 2

## 2024-09-16 NOTE — Progress Notes (Signed)
 [facility] Specialty and Home Delivery Pharmacy Refill Coordination Note    Specialty Medication(s) to be Shipped:   Infectious Disease: [name]  50-300 mg Tab (dolutegravir-lamiVUDine)    Other medication(s) to be shipped: No additional medications requested for fill at this time    Specialty Medications not needed at this time: N/A     Benjamin Ortiz, DOB: December 07, 1980  Phone: [phone] (home)       All above HIPAA information was verified with patient.     Was a nurse, learning disability used for this call? No    Completed refill call assessment today to schedule patient's medication shipment from the [facility] and Home Delivery Pharmacy  [phone]).  All relevant notes have been reviewed.     Specialty medication(s) and dose(s) confirmed: Regimen is correct and unchanged.   Changes to medications: Raja reports no changes at this time.  Changes to insurance: No  New side effects reported not previously addressed with a pharmacist or physician: None reported  Questions for the pharmacist: No    Confirmed patient received a Conservation Officer, Historic Buildings and a Surveyor, Mining with first shipment. The patient will receive a drug information handout for each medication shipped and additional FDA Medication Guides as required.       DISEASE/MEDICATION-SPECIFIC INFORMATION        N/A    SPECIALTY MEDICATION ADHERENCE     Medication Adherence    Patient reported X missed doses in the last month: 0  Specialty Medication: [name]  50-300 mg Tab (dolutegravir-lamiVUDine)  Patient is on additional specialty medications: No  Informant: patient              Were doses missed due to medication being on hold? No    [name]  50-300 mg Tab (dolutegravir-lamiVUDine): 6 days of medicine on hand       Specialty medication is an injection or given on a cycle: No    REFERRAL TO PHARMACIST     Referral to the pharmacist: Not needed      [facility]     Shipping address confirmed in Epic.     Cost and Payment: Patient has a $0 copay, payment information is not required.    Delivery Scheduled: Yes, Expected medication delivery date: 1/2.     Medication will be delivered via UPS to the prescription address in Epic WAM.    Jeoffrey JAYSON Sherra [facility] and [facility]

## 2024-09-18 MED FILL — DOVATO 50 MG-300 MG TABLET: ORAL | 30 days supply | Qty: 30 | Fill #1

## 2024-09-25 NOTE — Progress Notes (Signed)
 Removed patient flag as patient did not renew.  Updated CW.    Benjamin Ortiz  Benefits Counselor  Time of Intervention: 5 mins

## 2024-10-15 NOTE — Progress Notes (Signed)
 [facility] Specialty and Home Delivery Pharmacy Refill Coordination Note    Specialty Medication(s) to be Shipped:   Infectious Disease: [name]  50-300 mg Tab (dolutegravir-lamiVUDine)    Other medication(s) to be shipped: No additional medications requested for fill at this time    Specialty Medications not needed at this time: N/A     Benjamin Ortiz, DOB: [birth year]  Phone: [phone] (home)       All above HIPAA information was verified with patient.     Was a nurse, learning disability used for this call? No    Completed refill call assessment today to schedule patient's medication shipment from the [facility] and Home Delivery Pharmacy  [phone]).  All relevant notes have been reviewed.     Specialty medication(s) and dose(s) confirmed: Regimen is correct and unchanged.   Changes to medications: Benjamin Ortiz reports no changes at this time.  Changes to insurance: No  New side effects reported not previously addressed with a pharmacist or physician: None reported  Questions for the pharmacist: No    Confirmed patient received a Conservation Officer, Historic Buildings and a Surveyor, Mining with first shipment. The patient will receive a drug information handout for each medication shipped and additional FDA Medication Guides as required.       DISEASE/MEDICATION-SPECIFIC INFORMATION        N/A    SPECIALTY MEDICATION ADHERENCE     Medication Adherence    Patient reported X missed doses in the last month: 0  Specialty Medication: [name]  50-300 mg Tab (dolutegravir-lamiVUDine)  Patient is on additional specialty medications: No  Informant: patient              Were doses missed due to medication being on hold? No    [name]  50-300 mg Tab (dolutegravir-lamiVUDine)5 days of medicine on hand       Specialty medication is an injection or given on a cycle: No    REFERRAL TO PHARMACIST     Referral to the pharmacist: Not needed      [facility]     Shipping address confirmed in Epic.     Cost and Payment: Patient has a $0 copay, payment information is not required.    Delivery Scheduled: Yes, Expected medication delivery date: 1/30.     Medication will be delivered via UPS to the prescription address in Epic WAM.    Benjamin Ortiz [facility] and [facility]

## 2024-10-17 MED FILL — DOVATO 50 MG-300 MG TABLET: ORAL | 30 days supply | Qty: 30 | Fill #2
